# Patient Record
Sex: Female | Born: 1965 | Race: Black or African American | Hispanic: No | Marital: Married | Smoking: Never smoker
Health system: Southern US, Community
[De-identification: ages and names within clinical notes are randomized; demographics above are authoritative.]

## PROBLEM LIST (undated history)

## (undated) DIAGNOSIS — I82402 Acute embolism and thrombosis of unspecified deep veins of left lower extremity: Secondary | ICD-10-CM

## (undated) DIAGNOSIS — F32A Depression, unspecified: Secondary | ICD-10-CM

## (undated) DIAGNOSIS — G4700 Insomnia, unspecified: Secondary | ICD-10-CM

## (undated) DIAGNOSIS — F329 Major depressive disorder, single episode, unspecified: Secondary | ICD-10-CM

## (undated) DIAGNOSIS — K219 Gastro-esophageal reflux disease without esophagitis: Secondary | ICD-10-CM

## (undated) DIAGNOSIS — M549 Dorsalgia, unspecified: Secondary | ICD-10-CM

## (undated) DIAGNOSIS — M1990 Unspecified osteoarthritis, unspecified site: Secondary | ICD-10-CM

## (undated) DIAGNOSIS — E669 Obesity, unspecified: Secondary | ICD-10-CM

## (undated) DIAGNOSIS — R519 Headache, unspecified: Secondary | ICD-10-CM

## (undated) DIAGNOSIS — Z972 Presence of dental prosthetic device (complete) (partial): Secondary | ICD-10-CM

## (undated) DIAGNOSIS — D649 Anemia, unspecified: Secondary | ICD-10-CM

## (undated) DIAGNOSIS — R51 Headache: Secondary | ICD-10-CM

## (undated) DIAGNOSIS — IMO0002 Reserved for concepts with insufficient information to code with codable children: Secondary | ICD-10-CM

## (undated) DIAGNOSIS — Z9889 Other specified postprocedural states: Secondary | ICD-10-CM

## (undated) DIAGNOSIS — G8929 Other chronic pain: Secondary | ICD-10-CM

## (undated) DIAGNOSIS — I10 Essential (primary) hypertension: Secondary | ICD-10-CM

## (undated) DIAGNOSIS — R112 Nausea with vomiting, unspecified: Secondary | ICD-10-CM

## (undated) DIAGNOSIS — F419 Anxiety disorder, unspecified: Secondary | ICD-10-CM

## (undated) DIAGNOSIS — M4800 Spinal stenosis, site unspecified: Secondary | ICD-10-CM

## (undated) HISTORY — PX: LEEP: SHX91

## (undated) HISTORY — PX: CARPAL TUNNEL RELEASE: SHX101

## (undated) HISTORY — PX: MENISCUS REPAIR: SHX5179

## (undated) HISTORY — PX: BUNIONECTOMY: SHX129

## (undated) HISTORY — PX: ABDOMINAL HYSTERECTOMY: SHX81

## (undated) HISTORY — DX: Acute embolism and thrombosis of unspecified deep veins of left lower extremity: I82.402

## (undated) HISTORY — PX: TONSILLECTOMY: SUR1361

## (undated) HISTORY — PX: TUBAL LIGATION: SHX77

## (undated) HISTORY — DX: Obesity, unspecified: E66.9

## (undated) HISTORY — DX: Reserved for concepts with insufficient information to code with codable children: IMO0002

## (undated) HISTORY — PX: COLPOSCOPY: SHX161

## (undated) HISTORY — PX: LASER ABLATION CONDYLOMA CERVICAL / VULVAR: SUR819

## (undated) HISTORY — PX: BACK SURGERY: SHX140

---

## 1999-12-14 ENCOUNTER — Other Ambulatory Visit: Admission: RE | Admit: 1999-12-14 | Discharge: 1999-12-14 | Payer: Self-pay | Admitting: Obstetrics and Gynecology

## 1999-12-29 ENCOUNTER — Encounter (INDEPENDENT_AMBULATORY_CARE_PROVIDER_SITE_OTHER): Payer: Self-pay

## 1999-12-29 ENCOUNTER — Other Ambulatory Visit: Admission: RE | Admit: 1999-12-29 | Discharge: 1999-12-29 | Payer: Self-pay | Admitting: Obstetrics and Gynecology

## 2000-01-13 ENCOUNTER — Ambulatory Visit (HOSPITAL_COMMUNITY): Admission: RE | Admit: 2000-01-13 | Discharge: 2000-01-13 | Payer: Self-pay | Admitting: Obstetrics and Gynecology

## 2000-05-03 ENCOUNTER — Other Ambulatory Visit: Admission: RE | Admit: 2000-05-03 | Discharge: 2000-05-03 | Payer: Self-pay | Admitting: Obstetrics and Gynecology

## 2001-01-03 ENCOUNTER — Other Ambulatory Visit: Admission: RE | Admit: 2001-01-03 | Discharge: 2001-01-03 | Payer: Self-pay | Admitting: Obstetrics and Gynecology

## 2001-06-12 ENCOUNTER — Other Ambulatory Visit: Admission: RE | Admit: 2001-06-12 | Discharge: 2001-06-12 | Payer: Self-pay | Admitting: Obstetrics and Gynecology

## 2001-08-03 ENCOUNTER — Emergency Department (HOSPITAL_COMMUNITY): Admission: EM | Admit: 2001-08-03 | Discharge: 2001-08-03 | Payer: Self-pay

## 2002-01-08 ENCOUNTER — Other Ambulatory Visit: Admission: RE | Admit: 2002-01-08 | Discharge: 2002-01-08 | Payer: Self-pay | Admitting: Obstetrics and Gynecology

## 2002-06-30 ENCOUNTER — Other Ambulatory Visit: Admission: RE | Admit: 2002-06-30 | Discharge: 2002-06-30 | Payer: Self-pay | Admitting: Obstetrics and Gynecology

## 2003-02-03 ENCOUNTER — Other Ambulatory Visit: Admission: RE | Admit: 2003-02-03 | Discharge: 2003-02-03 | Payer: Self-pay | Admitting: Obstetrics and Gynecology

## 2004-02-09 ENCOUNTER — Other Ambulatory Visit: Admission: RE | Admit: 2004-02-09 | Discharge: 2004-02-09 | Payer: Self-pay | Admitting: Obstetrics and Gynecology

## 2004-06-03 ENCOUNTER — Ambulatory Visit (HOSPITAL_COMMUNITY): Admission: RE | Admit: 2004-06-03 | Discharge: 2004-06-03 | Payer: Self-pay | Admitting: Internal Medicine

## 2004-09-11 ENCOUNTER — Emergency Department (HOSPITAL_COMMUNITY): Admission: EM | Admit: 2004-09-11 | Discharge: 2004-09-11 | Payer: Self-pay | Admitting: Emergency Medicine

## 2004-09-29 ENCOUNTER — Encounter (INDEPENDENT_AMBULATORY_CARE_PROVIDER_SITE_OTHER): Payer: Self-pay | Admitting: Specialist

## 2004-09-29 ENCOUNTER — Ambulatory Visit (HOSPITAL_COMMUNITY): Admission: RE | Admit: 2004-09-29 | Discharge: 2004-09-29 | Payer: Self-pay | Admitting: Gastroenterology

## 2005-02-12 ENCOUNTER — Emergency Department (HOSPITAL_COMMUNITY): Admission: EM | Admit: 2005-02-12 | Discharge: 2005-02-12 | Payer: Self-pay | Admitting: Family Medicine

## 2005-02-16 ENCOUNTER — Other Ambulatory Visit: Admission: RE | Admit: 2005-02-16 | Discharge: 2005-02-16 | Payer: Self-pay | Admitting: Obstetrics and Gynecology

## 2005-05-07 ENCOUNTER — Emergency Department (HOSPITAL_COMMUNITY): Admission: EM | Admit: 2005-05-07 | Discharge: 2005-05-07 | Payer: Self-pay | Admitting: Family Medicine

## 2006-06-06 ENCOUNTER — Other Ambulatory Visit: Admission: RE | Admit: 2006-06-06 | Discharge: 2006-06-06 | Payer: Self-pay | Admitting: Obstetrics and Gynecology

## 2006-08-08 ENCOUNTER — Emergency Department (HOSPITAL_COMMUNITY): Admission: EM | Admit: 2006-08-08 | Discharge: 2006-08-08 | Payer: Self-pay | Admitting: Emergency Medicine

## 2007-01-19 ENCOUNTER — Emergency Department (HOSPITAL_COMMUNITY): Admission: EM | Admit: 2007-01-19 | Discharge: 2007-01-19 | Payer: Self-pay | Admitting: Emergency Medicine

## 2007-05-24 ENCOUNTER — Encounter: Admission: RE | Admit: 2007-05-24 | Discharge: 2007-05-24 | Payer: Self-pay | Admitting: Obstetrics and Gynecology

## 2008-07-08 ENCOUNTER — Encounter: Admission: RE | Admit: 2008-07-08 | Discharge: 2008-07-08 | Payer: Self-pay | Admitting: Obstetrics and Gynecology

## 2009-09-23 ENCOUNTER — Emergency Department (HOSPITAL_BASED_OUTPATIENT_CLINIC_OR_DEPARTMENT_OTHER): Admission: EM | Admit: 2009-09-23 | Discharge: 2009-09-23 | Payer: Self-pay | Admitting: Emergency Medicine

## 2009-09-23 ENCOUNTER — Ambulatory Visit: Payer: Self-pay | Admitting: Diagnostic Radiology

## 2009-09-30 ENCOUNTER — Encounter: Admission: RE | Admit: 2009-09-30 | Discharge: 2009-09-30 | Payer: Self-pay | Admitting: Internal Medicine

## 2009-10-20 ENCOUNTER — Encounter: Admission: RE | Admit: 2009-10-20 | Discharge: 2009-11-11 | Payer: Self-pay | Admitting: Orthopedic Surgery

## 2009-11-15 ENCOUNTER — Encounter: Admission: RE | Admit: 2009-11-15 | Discharge: 2010-02-13 | Payer: Self-pay | Admitting: Orthopedic Surgery

## 2009-12-07 ENCOUNTER — Encounter: Admission: RE | Admit: 2009-12-07 | Discharge: 2010-01-03 | Payer: Self-pay | Admitting: Orthopedic Surgery

## 2009-12-08 ENCOUNTER — Encounter: Admission: RE | Admit: 2009-12-08 | Discharge: 2009-12-08 | Payer: Self-pay | Admitting: Orthopedic Surgery

## 2010-03-25 ENCOUNTER — Ambulatory Visit: Payer: Self-pay | Admitting: Radiology

## 2010-03-25 ENCOUNTER — Emergency Department (HOSPITAL_BASED_OUTPATIENT_CLINIC_OR_DEPARTMENT_OTHER): Admission: EM | Admit: 2010-03-25 | Discharge: 2010-03-25 | Payer: Self-pay | Admitting: Emergency Medicine

## 2010-04-01 ENCOUNTER — Ambulatory Visit (HOSPITAL_COMMUNITY): Admission: RE | Admit: 2010-04-01 | Discharge: 2010-04-01 | Payer: Self-pay | Admitting: Obstetrics and Gynecology

## 2011-01-30 LAB — COMPREHENSIVE METABOLIC PANEL
ALT: 11 U/L (ref 0–35)
AST: 14 U/L (ref 0–37)
Albumin: 3.2 g/dL — ABNORMAL LOW (ref 3.5–5.2)
Alkaline Phosphatase: 52 U/L (ref 39–117)
BUN: 10 mg/dL (ref 6–23)
CO2: 28 mEq/L (ref 19–32)
Calcium: 9 mg/dL (ref 8.4–10.5)
Chloride: 104 mEq/L (ref 96–112)
Creatinine, Ser: 1.02 mg/dL (ref 0.4–1.2)
GFR calc Af Amer: >60 mL/min (ref >60)
GFR calc non Af Amer: 59 mL/min — ABNORMAL LOW (ref >60)
Glucose, Bld: 99 mg/dL (ref 70–99)
Potassium: 4.1 mEq/L (ref 3.5–5.1)
Sodium: 137 mEq/L (ref 135–145)
Total Bilirubin: 0.4 mg/dL (ref 0.3–1.2)
Total Protein: 6.7 g/dL (ref 6.0–8.3)

## 2011-01-30 LAB — CBC
HCT: 34.3 % — ABNORMAL LOW (ref 36.0–46.0)
Hemoglobin: 11.3 g/dL — ABNORMAL LOW (ref 12.0–15.0)
MCHC: 33 g/dL (ref 30.0–36.0)
MCV: 84.3 fL (ref 78.0–100.0)
Platelets: 230 10*3/uL (ref 150–400)
RBC: 4.07 MIL/uL (ref 3.87–5.11)
RDW: 13.4 % (ref 11.5–15.5)
WBC: 6.2 10*3/uL (ref 4.0–10.5)

## 2011-01-30 LAB — LIPID PANEL
Cholesterol: 132 mg/dL (ref 0–200)
HDL: 43 mg/dL (ref >39)
LDL Cholesterol: 59 mg/dL (ref 0–99)
Total CHOL/HDL Ratio: 3.1 RATIO
Triglycerides: 148 mg/dL (ref <150)
VLDL: 30 mg/dL (ref 0–40)

## 2011-01-30 LAB — HEMOGLOBIN A1C
Hgb A1c MFr Bld: 5.3 % (ref <5.7)
Mean Plasma Glucose: 105 mg/dL (ref <117)

## 2011-01-30 LAB — VITAMIN D 25 HYDROXY (VIT D DEFICIENCY, FRACTURES): Vit D, 25-Hydroxy: 20 ng/mL — ABNORMAL LOW (ref 30–89)

## 2011-01-31 LAB — URINALYSIS, ROUTINE W REFLEX MICROSCOPIC
Bilirubin Urine: NEGATIVE
Glucose, UA: NEGATIVE mg/dL
Ketones, ur: 15 mg/dL — AB
Leukocytes, UA: NEGATIVE
Nitrite: NEGATIVE
Protein, ur: NEGATIVE mg/dL
Specific Gravity, Urine: 1.03 (ref 1.005–1.030)
Urobilinogen, UA: 0.2 mg/dL (ref 0.0–1.0)
pH: 6 (ref 5.0–8.0)

## 2011-01-31 LAB — URINE MICROSCOPIC-ADD ON

## 2011-02-09 ENCOUNTER — Emergency Department (INDEPENDENT_AMBULATORY_CARE_PROVIDER_SITE_OTHER): Payer: Managed Care, Other (non HMO)

## 2011-02-09 ENCOUNTER — Emergency Department (HOSPITAL_BASED_OUTPATIENT_CLINIC_OR_DEPARTMENT_OTHER)
Admission: EM | Admit: 2011-02-09 | Discharge: 2011-02-09 | Disposition: A | Discharge status: Home / Self Care | Payer: Managed Care, Other (non HMO) | Attending: Emergency Medicine | Admitting: Emergency Medicine

## 2011-02-09 DIAGNOSIS — W010XXA Fall on same level from slipping, tripping and stumbling without subsequent striking against object, initial encounter: Secondary | ICD-10-CM

## 2011-02-09 DIAGNOSIS — M25529 Pain in unspecified elbow: Secondary | ICD-10-CM

## 2011-02-09 DIAGNOSIS — Y93E1 Activity, personal bathing and showering: Secondary | ICD-10-CM | POA: Insufficient documentation

## 2011-02-09 DIAGNOSIS — Z79899 Other long term (current) drug therapy: Secondary | ICD-10-CM | POA: Insufficient documentation

## 2011-02-09 DIAGNOSIS — Y92009 Unspecified place in unspecified non-institutional (private) residence as the place of occurrence of the external cause: Secondary | ICD-10-CM | POA: Insufficient documentation

## 2011-02-09 DIAGNOSIS — W19XXXA Unspecified fall, initial encounter: Secondary | ICD-10-CM | POA: Insufficient documentation

## 2011-02-09 DIAGNOSIS — S51009A Unspecified open wound of unspecified elbow, initial encounter: Secondary | ICD-10-CM | POA: Insufficient documentation

## 2011-02-09 DIAGNOSIS — I10 Essential (primary) hypertension: Secondary | ICD-10-CM | POA: Insufficient documentation

## 2011-03-31 NOTE — Op Note (Signed)
NAMEADAEZE, BETTER             ACCOUNT NO.:  [id number]   MEDICAL RECORD NO.:  [id number]          PATIENT TYPE:  AMB   LOCATION:  ENDO                         FACILITY:  [facility]   PHYSICIAN:  Jordan Hawks. Elnoria Howard, MD    DATE OF BIRTH:  04/26/1966   DATE OF PROCEDURE:  09/29/2004  DATE OF DISCHARGE:                                 OPERATIVE REPORT   PROCEDURE:  Colonoscopy.   INDICATIONS:  For diarrhea and abdominal pain.   CONSENT:  Informed consent was obtained from the patient describing the  risks of bleeding, infection, perforation, medication reactions, a 10% miss  rate for a small colon cancer or polyp, and the risk of death, all of which  are not exclusive of any other complications that may occur.   PHYSICAL EXAMINATION:  CARDIAC:  Regular rate and rhythm.  LUNGS:  Clear to auscultation bilaterally.  ABDOMEN:  Obese, soft, nontender, nondistended.   MEDICATIONS:  1.  Versed 4 mg IV.  2.  Demerol 60 mg IV.   PROCEDURE:  After completion of the EGD, the patient was positioned for the  colonoscopy.  A rectal examination was performed which was negative for any  palpable abnormalities.  The colonoscope was then inserted under direct  visualization to the terminal ileum with moderate difficulty.  The patient  was noted to have a tortuous colon.  Photodocumentation of the terminal  ileum and the cecum was performed, and the patient was noted to have a good  prep.  Upon withdrawal of the colonoscope, there was no evidence of any  masses, ulcerations, inflammation, polyps, erosions, vascular abnormalities  in the cecum, ascending, transverse, descending, or sigmoid colon.  The  patient is noted to have scattered diverticula throughout the colon, with  retroflexion of the colonoscope in the rectum.  There is evidence of  moderate-size external hemorrhoids.  Prior to withdrawal of the colonoscope,  multiple random  biopsies were taken throughout the colon. The colonoscope was  then  straightened and withdrawn from the patient.  The patient tolerated the  procedure well.  No complications were encountered.   PLAN AT THIS TIME:  Follow up with biopsies, and to return to clinic in two  weeks.       PDH/MEDQ  D:  09/29/2004  T:  09/29/2004  Job:  [id number]   cc:   Candyce Churn. Allyne Gee, M.D.  [address]  [address]  [place]  [place] [address]  Fax: [phone]

## 2011-03-31 NOTE — Op Note (Signed)
NAMEIJEOMA, Gwendolyn Russell             ACCOUNT NO.:  [id number]   MEDICAL RECORD NO.:  [id number]          PATIENT TYPE:  AMB   LOCATION:  ENDO                         FACILITY:  [facility]   PHYSICIAN:  Jordan Hawks. Elnoria Howard, MD    DATE OF BIRTH:  1966-06-16   DATE OF PROCEDURE:  09/29/2004  DATE OF DISCHARGE:                                 OPERATIVE REPORT   PROCEDURE:  Esophagogastroduodenoscopy.   INDICATIONS:  For abdominal pain and anemia.   CONSENT:  Informed consent was obtained from the patient describing the  risks of bleeding, infection, perforation, medication reactions, and the  risk of death, all of which are not exclusive of any other complications  that may occur.   PHYSICAL EXAMINATION:  CARDIAC:  Regular rate and rhythm.  LUNGS:  Clear to auscultation bilaterally.  ABDOMEN:  Soft, obese, nontender, nondistended.   MEDICATIONS:  1.  Versed 6 mg IV.  2.  Demerol 40 mg IV.  3.  Phenergan 12.5 mg IV.   PROCEDURE:  After adequate sedation was achieved, the endoscope was advanced  from the oral cavity to the proximal small bowel without any difficulty.  The photodocumentation of the duodenum was performed, and several random  small bowel biopsies were obtained.  Upon withdrawal of the endoscope into  the gastric lumen, the patient was noted to have a very small erosion in the  antrum.  Otherwise, there was no evidence of any ulcerations, masses,  vascular abnormalities, or evidence of overt inflammation.  Retroflexion was  negative for any hiatal hernia or masses at the cardia region.  The  endoscope was then straightened and withdrawn into the esophagus, and there  was  no evidence of any type of esophagitis or bleeding.  The endoscope was then  withdrawn from the patient, and the procedure was terminated.   PLAN AT THIS TIME:  Follow up on biopsies.       PDH/MEDQ  D:  09/29/2004  T:  09/29/2004  Job:  [id number]   cc:   Candyce Churn. Allyne Gee, M.D.  [address]  [address]  [place]  [place] [address]  Fax: [phone]

## 2011-07-31 ENCOUNTER — Other Ambulatory Visit: Payer: Self-pay | Admitting: Internal Medicine

## 2011-07-31 DIAGNOSIS — Z1231 Encounter for screening mammogram for malignant neoplasm of breast: Secondary | ICD-10-CM

## 2011-08-03 ENCOUNTER — Ambulatory Visit
Admission: RE | Admit: 2011-08-03 | Discharge: 2011-08-03 | Disposition: A | Discharge status: Home / Self Care | Payer: Managed Care, Other (non HMO) | Source: Ambulatory Visit | Attending: Internal Medicine | Admitting: Internal Medicine

## 2011-08-03 DIAGNOSIS — Z1231 Encounter for screening mammogram for malignant neoplasm of breast: Secondary | ICD-10-CM

## 2011-10-10 ENCOUNTER — Emergency Department (INDEPENDENT_AMBULATORY_CARE_PROVIDER_SITE_OTHER): Payer: Self-pay

## 2011-10-10 ENCOUNTER — Encounter: Payer: Self-pay | Admitting: Student

## 2011-10-10 ENCOUNTER — Emergency Department (HOSPITAL_BASED_OUTPATIENT_CLINIC_OR_DEPARTMENT_OTHER)
Admission: EM | Admit: 2011-10-10 | Discharge: 2011-10-10 | Disposition: A | Discharge status: Home / Self Care | Payer: Self-pay | Attending: Emergency Medicine | Admitting: Emergency Medicine

## 2011-10-10 DIAGNOSIS — J9819 Other pulmonary collapse: Secondary | ICD-10-CM

## 2011-10-10 DIAGNOSIS — R05 Cough: Secondary | ICD-10-CM

## 2011-10-10 DIAGNOSIS — J40 Bronchitis, not specified as acute or chronic: Secondary | ICD-10-CM | POA: Insufficient documentation

## 2011-10-10 DIAGNOSIS — I10 Essential (primary) hypertension: Secondary | ICD-10-CM | POA: Insufficient documentation

## 2011-10-10 DIAGNOSIS — K219 Gastro-esophageal reflux disease without esophagitis: Secondary | ICD-10-CM | POA: Insufficient documentation

## 2011-10-10 HISTORY — DX: Gastro-esophageal reflux disease without esophagitis: K21.9

## 2011-10-10 HISTORY — DX: Insomnia, unspecified: G47.00

## 2011-10-10 HISTORY — DX: Anxiety disorder, unspecified: F41.9

## 2011-10-10 HISTORY — DX: Essential (primary) hypertension: I10

## 2011-10-10 MED ORDER — AZITHROMYCIN 250 MG PO TABS: 250.0000 mg | ORAL_TABLET | Freq: Every day | ORAL | Status: AC

## 2011-10-10 MED ORDER — ALBUTEROL SULFATE HFA 108 (90 BASE) MCG/ACT IN AERS: 1.0000 | INHALATION_SPRAY | Freq: Four times a day (QID) | RESPIRATORY_TRACT | Status: DC | PRN

## 2011-10-10 NOTE — ED Provider Notes (Signed)
Medical screening examination/treatment/procedure(s) were performed by non-physician practitioner and as supervising physician I was immediately available for consultation/collaboration.   Gwyneth Sprout, MD 10/10/11 [phone]

## 2011-10-10 NOTE — ED Notes (Signed)
Onset Tuesday with ear itching and pain then progressed to scratchy throat and upper chest congestion.

## 2011-10-10 NOTE — ED Provider Notes (Signed)
History     CSN: [id number] Arrival date & time: 10/10/2011  2:04 PM   First MD Initiated Contact with Patient 10/10/11 1406      Chief Complaint  Patient presents with  . Cough    (Consider location/radiation/quality/duration/timing/severity/associated sxs/prior treatment) Patient is a 45 y.o. female presenting with cough. The history is provided by the patient. No language interpreter was used.  Cough This is a new problem. The current episode started more than 2 days ago. The problem occurs constantly. The problem has not changed since onset.The cough is productive of sputum. There has been no fever. Associated symptoms include rhinorrhea, sore throat and wheezing. Pertinent negatives include no chest pain. She has tried decongestants and cough syrup for the symptoms. She is not a smoker. Her past medical history is significant for bronchitis.    Past Medical History  Diagnosis Date  . Hypertension   . Anxiety   . Insomnia   . GERD (gastroesophageal reflux disease)     Past Surgical History  Procedure Date  . Tonsillectomy   . Carpal tunnel release   . Bunionectomy   . Laser ablation condyloma cervical / vulvar   . Tubal ligation     No family history on file.  History  Substance Use Topics  . Smoking status: Never Smoker   . Smokeless tobacco: Not on file  . Alcohol Use: No    OB History    Grav Para Term Preterm Abortions TAB SAB Ect Mult Living                  Review of Systems  HENT: Positive for sore throat and rhinorrhea.   Respiratory: Positive for cough and wheezing.   Cardiovascular: Negative for chest pain.  All other systems reviewed and are negative.    Allergies  Sulfa antibiotics and Tdap  Home Medications   Current Outpatient Rx  Name Route Sig Dispense Refill  . ALPRAZOLAM 0.5 MG PO TABS Oral Take 0.5 mg by mouth at bedtime as needed.      . TRIAMTERENE-HCTZ 50-25 MG PO CAPS Oral Take 0.5 capsules by mouth every morning.      Marland Kitchen  ZOLPIDEM TARTRATE 5 MG PO TABS Oral Take 5 mg by mouth at bedtime as needed.        BP 120/78  Pulse 87  Temp(Src) 98.4 F (36.9 C) (Oral)  Resp 20  Wt 244 lb (110.678 kg)  SpO2 97%  LMP 10/03/2011  Physical Exam  Nursing note and vitals reviewed. Constitutional: She is oriented to person, place, and time. She appears well-developed and well-nourished.  HENT:  Right Ear: External ear normal.  Left Ear: External ear normal.  Nose: Rhinorrhea present.  Mouth/Throat: Posterior oropharyngeal erythema present.  Neck: Normal range of motion. Neck supple.  Cardiovascular: Normal rate and regular rhythm.   Pulmonary/Chest: Effort normal and breath sounds normal.  Musculoskeletal: Normal range of motion.  Neurological: She is alert and oriented to person, place, and time.  Skin: Skin is warm and dry.  Psychiatric: She has a normal mood and affect.    ED Course  Procedures (including critical care time)  Labs Reviewed - No data to display Dg Chest 2 View  10/10/2011  *RADIOLOGY REPORT*  Clinical Data: Cough.  CHEST - 2 VIEW  Comparison: Chest x-ray 03/25/2010.  Findings: The cardiac silhouette, mediastinal and hilar contours are within normal limits and stable.  The lungs demonstrate streaky bibasilar subsegmental atelectasis but no infiltrates, edema or effusions.  The bony thorax is intact.  IMPRESSION: Streaky bibasilar subsegmental atelectasis but no infiltrates or effusions.  Original Report Authenticated By: P. Loralie Champagne, M.D.     1. Bronchitis       MDM  Pt not actively wheezing at this time:will treat for bronchitis        Teressa Lower, NP 10/10/11 1449

## 2012-03-26 ENCOUNTER — Other Ambulatory Visit: Payer: Self-pay | Admitting: Obstetrics and Gynecology

## 2012-03-26 ENCOUNTER — Ambulatory Visit (INDEPENDENT_AMBULATORY_CARE_PROVIDER_SITE_OTHER): Payer: Self-pay | Admitting: *Deleted

## 2012-03-26 ENCOUNTER — Encounter: Payer: Self-pay | Admitting: *Deleted

## 2012-03-26 VITALS — BP 125/83 | HR 87 | Temp 97.9°F | Ht 65.0 in | Wt 287.7 lb

## 2012-03-26 DIAGNOSIS — N6322 Unspecified lump in the left breast, upper inner quadrant: Secondary | ICD-10-CM

## 2012-03-26 DIAGNOSIS — Z1239 Encounter for other screening for malignant neoplasm of breast: Secondary | ICD-10-CM

## 2012-03-26 NOTE — Progress Notes (Signed)
Complaints of left breast lump and pain x 1 month.  Pap Smear:    Pap smear not performed today. Per patient last Pap smear was September 2012 at [facility] and normal. Per patient she had an abnormal Pap smear in 2000 that required a LEEP for follow up. Have requested above Pap smear result. No Pap smear results in EPIC.  Physical exam: Breasts Breasts symmetrical. No skin abnormalities bilateral breasts. No nipple retraction bilateral breasts. No nipple discharge bilateral breasts. No lymphadenopathy. No lumps palpated right breast. Palpated lump in the left breast at 10 o'clock 7 cm from the areola. Patient complained of tenderness when palpated lump. Patient states pain at lump occurs at times and when happens rates pain at a 10 out of 10. Patient referred to the [facility] for left breast diagnostic mammogram and left breast ultrasound. Appointment scheduled for Monday, Apr 01, 2012 at 1415.        Pelvic/Bimanual No Pap smear completed today since last Pap smear was September 2012. Pap smear not indicated per BCCCP guidelines.

## 2012-03-26 NOTE — Patient Instructions (Signed)
Taught patient how to perform BSE. Patient did not need a Pap smear today due to last Pap smear was September 2012 per patient. Let her know BCCCP will cover Pap smears every 3 years unless has a history of abnormal Pap smears. Patient referred to the [facility] for left breast diagnostic mammogram and left breast ultrasound. Appointment scheduled for Monday, Apr 01, 2012 at 1415.  Patient aware of appointment and will be there. Let patient know will follow up with her within the next couple weeks with results. Patient verbalized understanding.

## 2012-04-01 ENCOUNTER — Ambulatory Visit
Admission: RE | Admit: 2012-04-01 | Discharge: 2012-04-01 | Disposition: A | Discharge status: Home / Self Care | Payer: No Typology Code available for payment source | Source: Ambulatory Visit | Attending: Obstetrics and Gynecology | Admitting: Obstetrics and Gynecology

## 2012-04-01 DIAGNOSIS — N6322 Unspecified lump in the left breast, upper inner quadrant: Secondary | ICD-10-CM

## 2012-07-16 ENCOUNTER — Other Ambulatory Visit: Payer: Self-pay | Admitting: Obstetrics and Gynecology

## 2012-07-16 DIAGNOSIS — Z1231 Encounter for screening mammogram for malignant neoplasm of breast: Secondary | ICD-10-CM

## 2012-08-06 ENCOUNTER — Ambulatory Visit: Payer: Self-pay

## 2012-08-21 ENCOUNTER — Ambulatory Visit
Admission: RE | Admit: 2012-08-21 | Discharge: 2012-08-21 | Disposition: A | Discharge status: Home / Self Care | Payer: No Typology Code available for payment source | Source: Ambulatory Visit | Attending: Obstetrics and Gynecology | Admitting: Obstetrics and Gynecology

## 2012-08-21 DIAGNOSIS — Z1231 Encounter for screening mammogram for malignant neoplasm of breast: Secondary | ICD-10-CM

## 2012-09-05 ENCOUNTER — Ambulatory Visit: Payer: Self-pay

## 2012-09-18 IMAGING — CR DG ELBOW COMPLETE 3+V*L*
5 series · 5 of 5 positions shown · non-contrast
Comparison: None

CLINICAL DATA: Pain left elbow post fall

LEFT ELBOW - COMPLETE 3+ VIEW

[x elbow joint ap left]
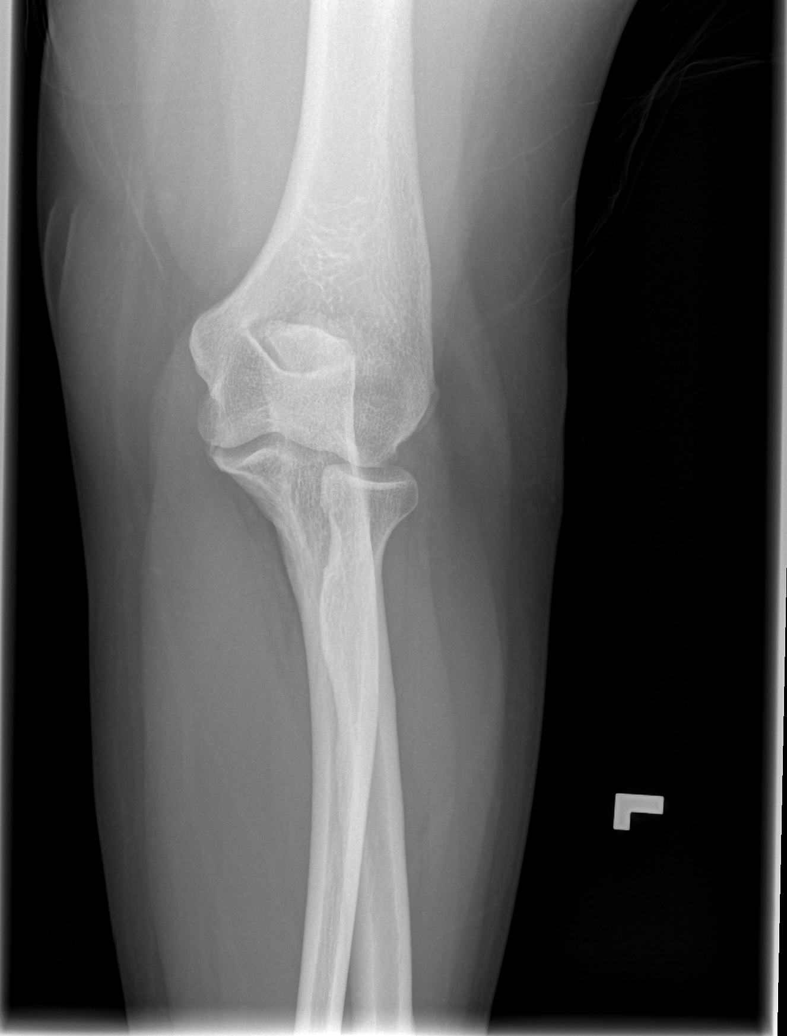

[x elbow joint obl. left (1 of 3)]
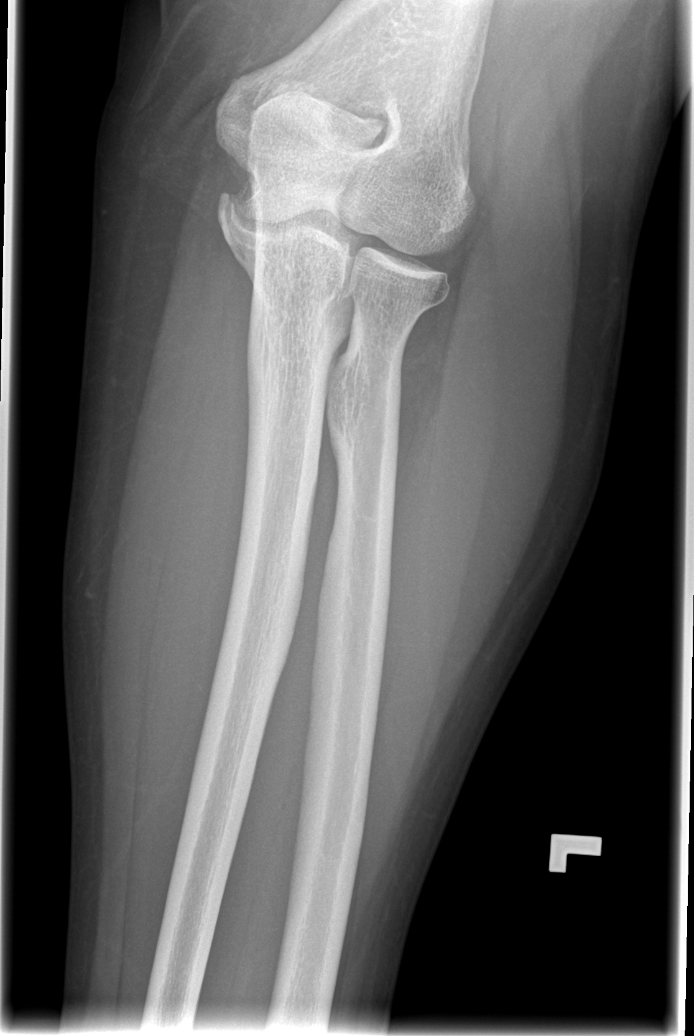

[x elbow joint obl. left (2 of 3)]
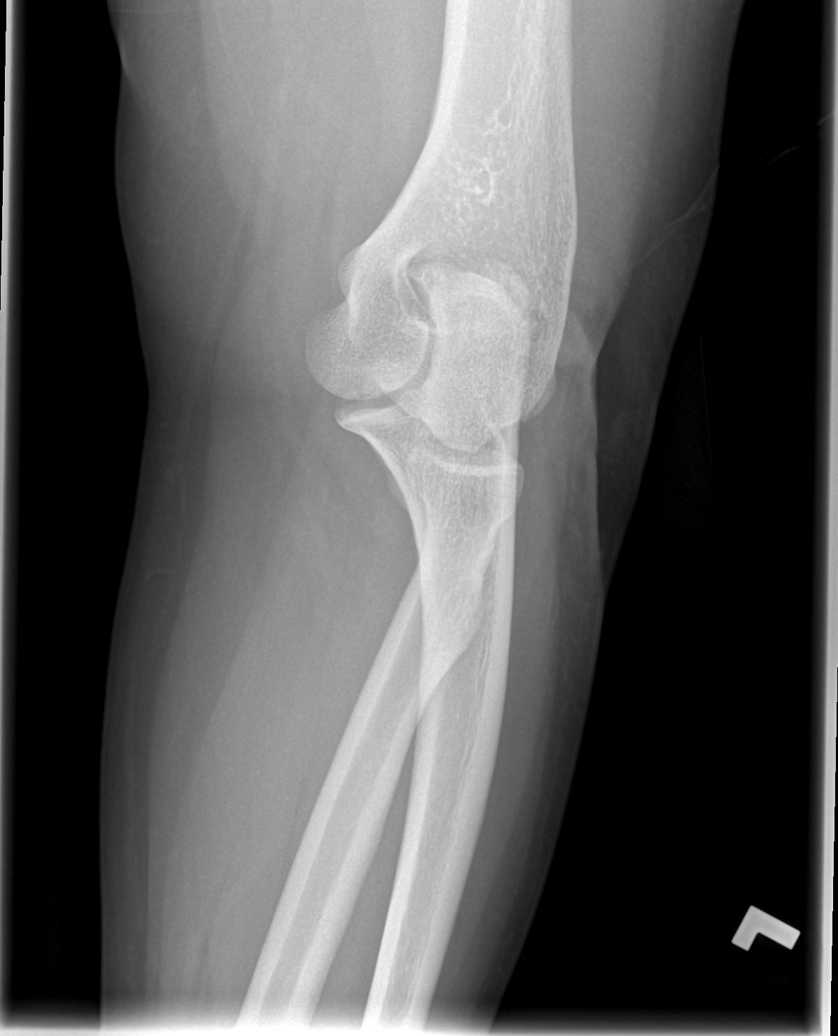

[x elbow joint obl. left (3 of 3)]
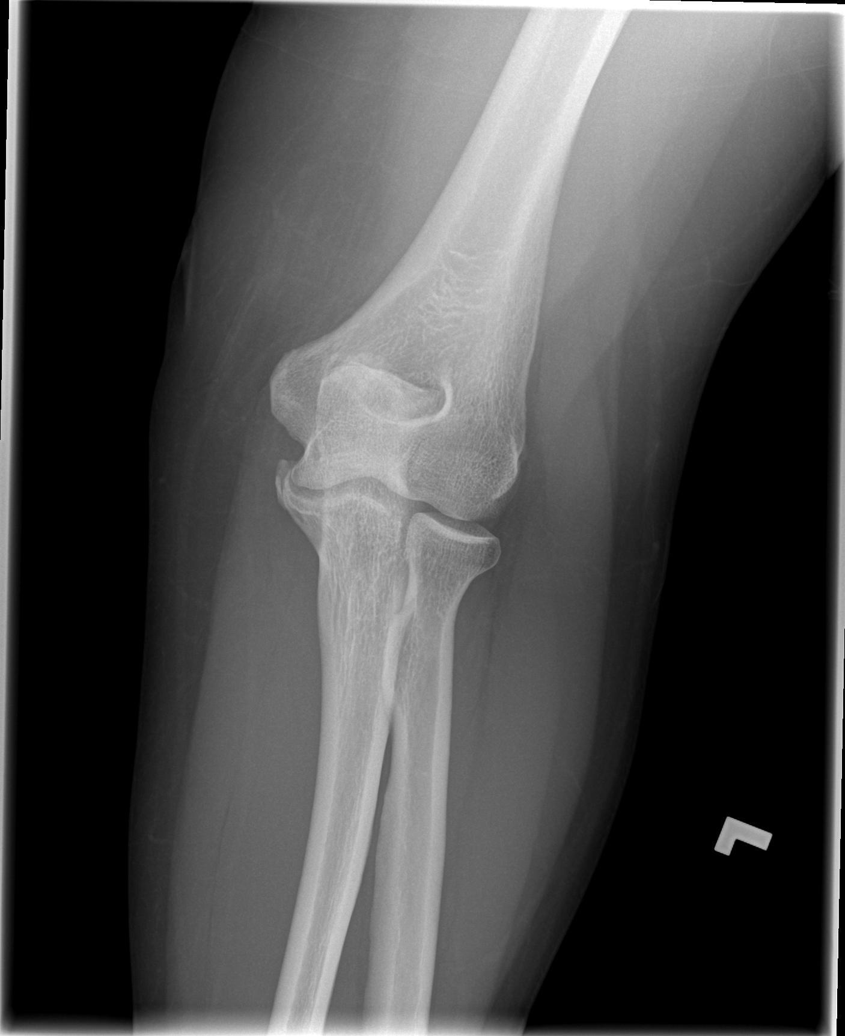

[x elbow joint lat left]
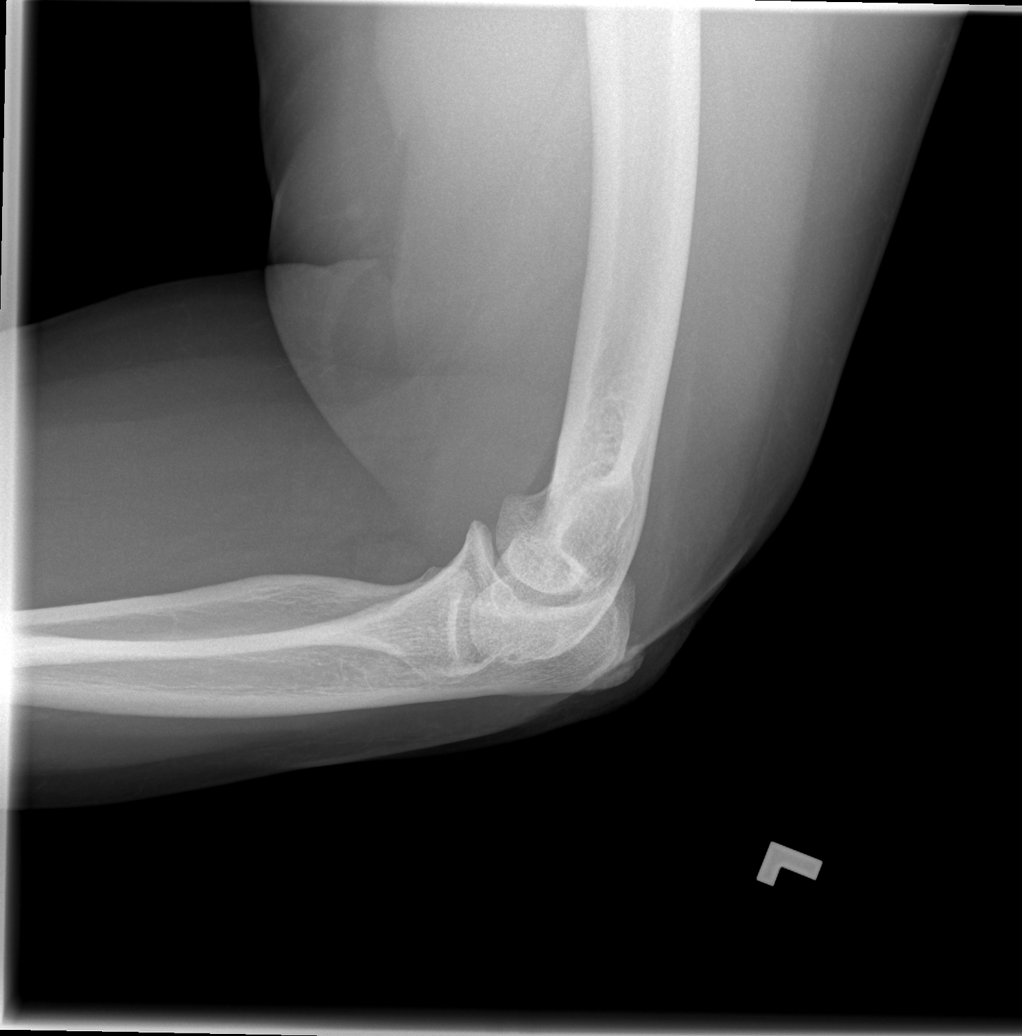

[5 of 5 positions shown; findings below may reference images not displayed]

FINDINGS: Joint spaces preserved.
Osseous mineralization normal.
Olecranon spur.
No acute fracture, dislocation or bone destruction.
No elbow joint effusion.
IMPRESSION: Olecranon spur.
No acute osseous abnormalities.

## 2012-10-17 ENCOUNTER — Encounter: Payer: Self-pay | Admitting: Obstetrics & Gynecology

## 2012-11-04 ENCOUNTER — Emergency Department (HOSPITAL_BASED_OUTPATIENT_CLINIC_OR_DEPARTMENT_OTHER): Payer: Self-pay

## 2012-11-04 ENCOUNTER — Encounter (HOSPITAL_BASED_OUTPATIENT_CLINIC_OR_DEPARTMENT_OTHER): Payer: Self-pay | Admitting: Emergency Medicine

## 2012-11-04 ENCOUNTER — Emergency Department (HOSPITAL_BASED_OUTPATIENT_CLINIC_OR_DEPARTMENT_OTHER)
Admission: EM | Admit: 2012-11-04 | Discharge: 2012-11-04 | Disposition: A | Discharge status: Home / Self Care | Payer: Self-pay | Attending: Emergency Medicine | Admitting: Emergency Medicine

## 2012-11-04 DIAGNOSIS — F411 Generalized anxiety disorder: Secondary | ICD-10-CM | POA: Insufficient documentation

## 2012-11-04 DIAGNOSIS — Z79899 Other long term (current) drug therapy: Secondary | ICD-10-CM | POA: Insufficient documentation

## 2012-11-04 DIAGNOSIS — Z9851 Tubal ligation status: Secondary | ICD-10-CM | POA: Insufficient documentation

## 2012-11-04 DIAGNOSIS — Z9889 Other specified postprocedural states: Secondary | ICD-10-CM | POA: Insufficient documentation

## 2012-11-04 DIAGNOSIS — Z86718 Personal history of other venous thrombosis and embolism: Secondary | ICD-10-CM | POA: Insufficient documentation

## 2012-11-04 DIAGNOSIS — M62838 Other muscle spasm: Secondary | ICD-10-CM | POA: Insufficient documentation

## 2012-11-04 DIAGNOSIS — I10 Essential (primary) hypertension: Secondary | ICD-10-CM | POA: Insufficient documentation

## 2012-11-04 DIAGNOSIS — Z8719 Personal history of other diseases of the digestive system: Secondary | ICD-10-CM | POA: Insufficient documentation

## 2012-11-04 MED ORDER — HYDROCODONE-ACETAMINOPHEN 5-500 MG PO TABS: 1.0000 | ORAL_TABLET | Freq: Four times a day (QID) | ORAL | Status: DC | PRN

## 2012-11-04 MED ORDER — NAPROXEN 375 MG PO TABS: 375.0000 mg | ORAL_TABLET | Freq: Two times a day (BID) | ORAL | Status: DC

## 2012-11-04 MED ORDER — METHOCARBAMOL 500 MG PO TABS
1000.0000 mg | ORAL_TABLET | Freq: Once | ORAL | Status: AC
  Administered 2012-11-04: 1000 mg via ORAL
  Filled 2012-11-04: qty 1

## 2012-11-04 MED ORDER — OXYCODONE-ACETAMINOPHEN 5-325 MG PO TABS: 1.0000 | ORAL_TABLET | ORAL | Status: DC | PRN

## 2012-11-04 MED ORDER — OXYCODONE HCL 5 MG/5ML PO SOLN: 5.0000 mg | ORAL | Status: DC | PRN

## 2012-11-04 MED ORDER — METHOCARBAMOL 500 MG PO TABS
ORAL_TABLET | ORAL | Status: AC
  Filled 2012-11-04: qty 1

## 2012-11-04 MED ORDER — METHOCARBAMOL 500 MG PO TABS: 500.0000 mg | ORAL_TABLET | Freq: Two times a day (BID) | ORAL | Status: DC

## 2012-11-04 MED ORDER — KETOROLAC TROMETHAMINE 60 MG/2ML IM SOLN
60.0000 mg | Freq: Once | INTRAMUSCULAR | Status: AC
  Administered 2012-11-04: 60 mg via INTRAMUSCULAR
  Filled 2012-11-04: qty 2

## 2012-11-04 NOTE — ED Notes (Signed)
Pt report she is having neck pain 10/10 for the last two weeks denies injury or event that lead to pain.

## 2012-11-04 NOTE — ED Provider Notes (Signed)
History     CSN: [id number]  Arrival date & time 11/04/12  0544   First MD Initiated Contact with Patient 11/04/12 [phone]      Chief Complaint  Patient presents with  . Muscle Pain    neck pain    (Consider location/radiation/quality/duration/timing/severity/associated sxs/prior treatment) Patient is a 46 y.o. female presenting with musculoskeletal pain. The history is provided by the patient. No language interpreter was used.  Muscle Pain This is a chronic problem. The current episode started more than 1 week ago (more than 2 weeks ago). The problem occurs constantly. The problem has not changed since onset.Pertinent negatives include no chest pain, no abdominal pain, no headaches and no shortness of breath. Exacerbated by: moving the neck to the left and palpation of the left neck. Nothing relieves the symptoms. She has tried nothing for the symptoms. The treatment provided no relief.  No weakness no numbness.  No f/c/r.  No rashes.    Past Medical History  Diagnosis Date  . Hypertension   . Anxiety   . Insomnia   . GERD (gastroesophageal reflux disease)   . Abnormal Pap smear   . DVT of leg (deep venous thrombosis), left     Past Surgical History  Procedure Date  . Tonsillectomy   . Carpal tunnel release   . Bunionectomy   . Laser ablation condyloma cervical / vulvar   . Tubal ligation   . Leep   . Colposcopy     Family History  Problem Relation Age of Onset  . Heart disease Maternal Grandmother   . Diabetes Father   . Heart disease Father   . Deep vein thrombosis Father   . Early death Mother   . Breast cancer Sister   . Diabetes Sister     History  Substance Use Topics  . Smoking status: Never Smoker   . Smokeless tobacco: Not on file  . Alcohol Use: No    OB History    Grav Para Term Preterm Abortions TAB SAB Ect Mult Living   2 2 2       2       Review of Systems  Constitutional: Negative for fever.  HENT: Positive for neck pain.     Respiratory: Negative for shortness of breath.   Cardiovascular: Negative for chest pain.  Gastrointestinal: Negative for abdominal pain.  Neurological: Negative for weakness, numbness and headaches.  All other systems reviewed and are negative.    Allergies  Sulfa antibiotics and Tdap  Home Medications   Current Outpatient Rx  Name  Route  Sig  Dispense  Refill  . ALBUTEROL SULFATE HFA 108 (90 BASE) MCG/ACT IN AERS   Inhalation   Inhale 1-2 puffs into the lungs every 6 (six) hours as needed for wheezing.   1 Inhaler   0   . ALPRAZOLAM 0.5 MG PO TABS   Oral   Take 0.5 mg by mouth at bedtime as needed.           Marland Kitchen OLMESARTAN-AMLODIPINE-HCTZ 40-10-12.5 MG PO TABS   Oral   Take by mouth.         . TRIAMTERENE-HCTZ 50-25 MG PO CAPS   Oral   Take 0.5 capsules by mouth every morning.           Marland Kitchen ZOLPIDEM TARTRATE 5 MG PO TABS   Oral   Take 5 mg by mouth at bedtime as needed.             BP  144/82  Pulse 103  Temp 98.4 F (36.9 C) (Oral)  Resp 23  SpO2 98%  Physical Exam  Constitutional: She is oriented to person, place, and time. She appears well-developed and well-nourished. No distress.  HENT:  Head: Normocephalic and atraumatic. Head is without raccoon's eyes, without Battle's sign, without right periorbital erythema and without left periorbital erythema.  Right Ear: No mastoid tenderness. Tympanic membrane is not injected. No hemotympanum.  Left Ear: No mastoid tenderness. Tympanic membrane is not injected. No hemotympanum.  Mouth/Throat: Oropharynx is clear and moist. No oropharyngeal exudate.  Eyes: Conjunctivae normal and EOM are normal. Pupils are equal, round, and reactive to light.  Neck: Normal range of motion. Neck supple. No JVD present. No tracheal deviation present.       No submandibular, no anterior no posterior LAN no supraclavicular LAN  Cardiovascular: Normal rate, regular rhythm and intact distal pulses.   Pulmonary/Chest: Effort  normal and breath sounds normal. No stridor. She has no wheezes. She has no rales.  Abdominal: Soft. Bowel sounds are normal. There is no tenderness.  Musculoskeletal: Normal range of motion. She exhibits no edema.       Arms: Lymphadenopathy:    She has no cervical adenopathy.  Neurological: She is alert and oriented to person, place, and time. She has normal reflexes.  Skin: Skin is warm and dry.  Psychiatric: She has a normal mood and affect.    ED Course  Procedures (including critical care time)  Labs Reviewed - No data to display No results found.   No diagnosis found.    MDM  Follow up with your doctor for ongoing care return for fevers swelling weakness numbness or any concerns        Hanaa Payes K Archibald Marchetta-Rasch, MD 11/04/12 [phone]

## 2012-11-05 ENCOUNTER — Emergency Department (HOSPITAL_BASED_OUTPATIENT_CLINIC_OR_DEPARTMENT_OTHER)
Admission: EM | Admit: 2012-11-05 | Discharge: 2012-11-05 | Disposition: A | Discharge status: Home / Self Care | Payer: Self-pay | Attending: Emergency Medicine | Admitting: Emergency Medicine

## 2012-11-05 ENCOUNTER — Encounter (HOSPITAL_BASED_OUTPATIENT_CLINIC_OR_DEPARTMENT_OTHER): Payer: Self-pay | Admitting: Family Medicine

## 2012-11-05 DIAGNOSIS — Z8742 Personal history of other diseases of the female genital tract: Secondary | ICD-10-CM | POA: Insufficient documentation

## 2012-11-05 DIAGNOSIS — K0889 Other specified disorders of teeth and supporting structures: Secondary | ICD-10-CM

## 2012-11-05 DIAGNOSIS — Z8679 Personal history of other diseases of the circulatory system: Secondary | ICD-10-CM | POA: Insufficient documentation

## 2012-11-05 DIAGNOSIS — Z79899 Other long term (current) drug therapy: Secondary | ICD-10-CM | POA: Insufficient documentation

## 2012-11-05 DIAGNOSIS — Z8719 Personal history of other diseases of the digestive system: Secondary | ICD-10-CM | POA: Insufficient documentation

## 2012-11-05 DIAGNOSIS — F411 Generalized anxiety disorder: Secondary | ICD-10-CM | POA: Insufficient documentation

## 2012-11-05 DIAGNOSIS — I10 Essential (primary) hypertension: Secondary | ICD-10-CM | POA: Insufficient documentation

## 2012-11-05 DIAGNOSIS — G4700 Insomnia, unspecified: Secondary | ICD-10-CM | POA: Insufficient documentation

## 2012-11-05 DIAGNOSIS — K089 Disorder of teeth and supporting structures, unspecified: Secondary | ICD-10-CM | POA: Insufficient documentation

## 2012-11-05 DIAGNOSIS — R220 Localized swelling, mass and lump, head: Secondary | ICD-10-CM | POA: Insufficient documentation

## 2012-11-05 MED ORDER — HYDROCODONE-ACETAMINOPHEN 5-325 MG PO TABS: 2.0000 | ORAL_TABLET | ORAL | Status: AC | PRN

## 2012-11-05 MED ORDER — KETOROLAC TROMETHAMINE 60 MG/2ML IM SOLN
INTRAMUSCULAR | Status: AC
  Filled 2012-11-05: qty 2

## 2012-11-05 MED ORDER — PENICILLIN V POTASSIUM 500 MG PO TABS: 500.0000 mg | ORAL_TABLET | Freq: Four times a day (QID) | ORAL | Status: AC

## 2012-11-05 MED ORDER — KETOROLAC TROMETHAMINE 60 MG/2ML IM SOLN
60.0000 mg | Freq: Once | INTRAMUSCULAR | Status: AC
  Administered 2012-11-05: 13:00:00 via INTRAMUSCULAR

## 2012-11-05 NOTE — ED Provider Notes (Signed)
Medical screening examination/treatment/procedure(s) were performed by non-physician practitioner and as supervising physician I was immediately available for consultation/collaboration.  Jones Skene, M.D.     Jones Skene, MD 11/05/12 1604

## 2012-11-05 NOTE — ED Notes (Signed)
Pt c/o left upper dental pain since Thanksgiving and worse past 2 days. Pt also c/o left lateral neck "spasm". Pt was seen and treated here 2 days ago for same. Pt sts she is running out of meds for same.

## 2012-11-05 NOTE — ED Provider Notes (Signed)
History     CSN: [id number]  Arrival date & time 11/05/12  1016   First MD Initiated Contact with Patient 11/05/12 1208      Chief Complaint  Patient presents with  . Dental Pain    (Consider location/radiation/quality/duration/timing/severity/associated sxs/prior treatment) Patient is a 46 y.o. female presenting with tooth pain. The history is provided by the patient. No language interpreter was used.  Dental PainThe primary symptoms include mouth pain and dental injury. The symptoms began more than 1 month ago. The symptoms are worsening. The symptoms are chronic. The symptoms occur constantly.  Additional symptoms include: gum swelling and facial swelling. Medical issues do not include: alcohol problem.  Pt complains of a toothache and neck pain  Past Medical History  Diagnosis Date  . Hypertension   . Anxiety   . Insomnia   . GERD (gastroesophageal reflux disease)   . Abnormal Pap smear   . DVT of leg (deep venous thrombosis), left     Past Surgical History  Procedure Date  . Tonsillectomy   . Carpal tunnel release   . Bunionectomy   . Laser ablation condyloma cervical / vulvar   . Tubal ligation   . Leep   . Colposcopy     Family History  Problem Relation Age of Onset  . Heart disease Maternal Grandmother   . Diabetes Father   . Heart disease Father   . Deep vein thrombosis Father   . Early death Mother   . Breast cancer Sister   . Diabetes Sister     History  Substance Use Topics  . Smoking status: Never Smoker   . Smokeless tobacco: Not on file  . Alcohol Use: No    OB History    Grav Para Term Preterm Abortions TAB SAB Ect Mult Living   2 2 2       2       Review of Systems  HENT: Positive for facial swelling and dental problem.   All other systems reviewed and are negative.    Allergies  Sulfa antibiotics and Tdap  Home Medications   Current Outpatient Rx  Name  Route  Sig  Dispense  Refill  . ALBUTEROL SULFATE HFA 108 (90 BASE)  MCG/ACT IN AERS   Inhalation   Inhale 1-2 puffs into the lungs every 6 (six) hours as needed for wheezing.   1 Inhaler   0   . ALPRAZOLAM 0.5 MG PO TABS   Oral   Take 0.5 mg by mouth at bedtime as needed.           . MELOXICAM 7.5 MG PO TABS   Oral   Take 7.5 mg by mouth daily.         Marland Kitchen METHOCARBAMOL 500 MG PO TABS   Oral   Take 1 tablet (500 mg total) by mouth 2 (two) times daily.   20 tablet   0   . NAPROXEN 375 MG PO TABS   Oral   Take 1 tablet (375 mg total) by mouth 2 (two) times daily.   20 tablet   0   . OLMESARTAN-AMLODIPINE-HCTZ 40-10-12.5 MG PO TABS   Oral   Take by mouth.         . OXYCODONE HCL 5 MG/5ML PO SOLN   Oral   Take 5 mLs (5 mg total) by mouth every 4 (four) hours as needed for pain.   30 mL   0   . OXYCODONE-ACETAMINOPHEN 5-325 MG PO TABS  Oral   Take 1 tablet by mouth every 4 (four) hours as needed for pain.   6 tablet   0   . TRIAMTERENE-HCTZ 50-25 MG PO CAPS   Oral   Take 0.5 capsules by mouth every morning.           Marland Kitchen ZOLPIDEM TARTRATE 5 MG PO TABS   Oral   Take 5 mg by mouth at bedtime as needed.             BP 114/69  Pulse 86  Temp 97.8 F (36.6 C) (Oral)  Resp 16  SpO2 99%  Physical Exam  Nursing note and vitals reviewed. Constitutional: She is oriented to person, place, and time. She appears well-developed and well-nourished.  HENT:  Head: Normocephalic and atraumatic.  Right Ear: External ear normal.  Left Ear: External ear normal.       Gum swelling,  Tender left neck  Eyes: Conjunctivae normal and EOM are normal. Pupils are equal, round, and reactive to light.  Neck: Normal range of motion. Neck supple.  Cardiovascular: Normal rate and normal heart sounds.   Pulmonary/Chest: Effort normal and breath sounds normal.  Abdominal: Soft.  Musculoskeletal: Normal range of motion.  Neurological: She is alert and oriented to person, place, and time. She has normal reflexes.  Skin: Skin is warm.   Psychiatric: She has a normal mood and affect.    ED Course  Procedures (including critical care time)  Labs Reviewed - No data to display Dg Cervical Spine Complete  11/04/2012  *RADIOLOGY REPORT*  Clinical Data: Neck pain for two weeks.  CERVICAL SPINE - COMPLETE 4+ VIEW  Comparison: None.  Findings: There is no evidence of fracture or subluxation. Vertebral bodies demonstrate normal height and alignment. Intervertebral disc spaces are preserved.  Scattered small anterior and posterior disc osteophyte complexes are noted along the lower cervical spine.  Prevertebral soft tissues are within normal limits.  The provided odontoid view demonstrates no significant abnormality.  The visualized lung apices are clear.  IMPRESSION:  1.  No evidence of fracture or subluxation along the cervical spine. 2.  Mild degenerative change noted along the lower cervical spine.   Original Report Authenticated By: Tonia Ghent, M.D.      No diagnosis found.    MDM  Pt seen for the same yesterday  Has taken all percocet.   I will treat with pcn and hydrocodone       Lonia Skinner Donalds, [place] 11/05/12 1222

## 2012-11-05 NOTE — ED Notes (Signed)
Family at bedside. 

## 2012-11-15 ENCOUNTER — Encounter: Payer: Self-pay | Admitting: Obstetrics & Gynecology

## 2012-11-15 ENCOUNTER — Ambulatory Visit (INDEPENDENT_AMBULATORY_CARE_PROVIDER_SITE_OTHER): Payer: Self-pay | Admitting: Obstetrics & Gynecology

## 2012-11-15 VITALS — BP 121/70 | HR 96 | Temp 97.5°F | Ht 65.0 in | Wt 293.4 lb

## 2012-11-15 DIAGNOSIS — N920 Excessive and frequent menstruation with regular cycle: Secondary | ICD-10-CM | POA: Insufficient documentation

## 2012-11-15 DIAGNOSIS — Z01812 Encounter for preprocedural laboratory examination: Secondary | ICD-10-CM

## 2012-11-15 DIAGNOSIS — E669 Obesity, unspecified: Secondary | ICD-10-CM

## 2012-11-15 LAB — CBC
HCT: 34.6 % — ABNORMAL LOW (ref 36.0–46.0)
Hemoglobin: 11.2 g/dL — ABNORMAL LOW (ref 12.0–15.0)
MCH: 26.5 pg (ref 26.0–34.0)
MCV: 81.8 fL (ref 78.0–100.0)
Platelets: 301 10*3/uL (ref 150–400)
RBC: 4.23 MIL/uL (ref 3.87–5.11)

## 2012-11-15 LAB — POCT PREGNANCY, URINE: Preg Test, Ur: NEGATIVE

## 2012-11-15 MED ORDER — FLUCONAZOLE 150 MG PO TABS: 150.0000 mg | ORAL_TABLET | Freq: Once | ORAL | Status: DC

## 2012-11-15 MED ORDER — NORELGESTROMIN-ETH ESTRADIOL 150-35 MCG/24HR TD PTWK: 1.0000 | MEDICATED_PATCH | TRANSDERMAL | Status: DC

## 2012-11-15 NOTE — Patient Instructions (Signed)

## 2012-11-15 NOTE — Progress Notes (Signed)
Patient ID: Gwendolyn Russell, female   DOB: [birth year], 47 y.o.   MRN: [id number]  Chief Complaint  Patient presents with  . Referral    irregular bleeding; sometimes two period per month    HPI Gwendolyn Russell is a 47 y.o. female.  [id number] Patient's last menstrual period was 11/15/2012. Frequent menses, previously on Ortho Evra for cycle control. She was worked up age 73 for possible DVT and this was not diagnosed, never treated. HPI  Past Medical History  Diagnosis Date  . Hypertension   . Anxiety   . Insomnia   . GERD (gastroesophageal reflux disease)   . Abnormal Pap smear   . DVT of leg (deep venous thrombosis), left     Past Surgical History  Procedure Date  . Tonsillectomy   . Carpal tunnel release   . Bunionectomy   . Laser ablation condyloma cervical / vulvar   . Tubal ligation   . Leep   . Colposcopy     Family History  Problem Relation Age of Onset  . Heart disease Maternal Grandmother   . Diabetes Father   . Heart disease Father   . Deep vein thrombosis Father   . Early death Mother   . Breast cancer Sister   . Diabetes Sister     Social History History  Substance Use Topics  . Smoking status: Never Smoker   . Smokeless tobacco: Not on file  . Alcohol Use: No    Allergies  Allergen Reactions  . Sulfa Antibiotics   . Tdap (Diphth-Acell Pertussis-Tetanus)     Current Outpatient Prescriptions  Medication Sig Dispense Refill  . ALPRAZolam (XANAX) 0.5 MG tablet Take 0.5 mg by mouth at bedtime as needed.        . diazepam (VALIUM) 5 MG tablet Take 5 mg by mouth 3 (three) times daily as needed.      Marland Kitchen HYDROcodone-acetaminophen (NORCO/VICODIN) 5-325 MG per tablet Take 2 tablets by mouth every 4 (four) hours as needed for pain.  16 tablet  0  . ibuprofen (ADVIL,MOTRIN) 200 MG tablet Take 200 mg by mouth as needed.      . meloxicam (MOBIC) 7.5 MG tablet Take 7.5 mg by mouth daily.      . methocarbamol (ROBAXIN) 500 MG tablet Take 1 tablet (500 mg  total) by mouth 2 (two) times daily.  20 tablet  0  . Olmesartan-Amlodipine-HCTZ (TRIBENZOR) 40-10-12.5 MG TABS Take by mouth.      . oxyCODONE (ROXICODONE) 5 MG/5ML solution Take 5 mLs (5 mg total) by mouth every 4 (four) hours as needed for pain.  30 mL  0  . penicillin v potassium (VEETID) 500 MG tablet Take 500 mg by mouth 4 (four) times daily.      . predniSONE (DELTASONE) 20 MG tablet Take 20 mg by mouth daily. Take three tablets bu mouth for 5 days      . zolpidem (AMBIEN) 5 MG tablet Take 5 mg by mouth at bedtime as needed.        Marland Kitchen albuterol (PROVENTIL HFA;VENTOLIN HFA) 108 (90 BASE) MCG/ACT inhaler Inhale 1-2 puffs into the lungs every 6 (six) hours as needed for wheezing.  1 Inhaler  0  . fluconazole (DIFLUCAN) 150 MG tablet Take 1 tablet (150 mg total) by mouth once.  1 tablet  0  . naproxen (NAPROSYN) 375 MG tablet Take 1 tablet (375 mg total) by mouth 2 (two) times daily.  20 tablet  0  . norelgestromin-ethinyl estradiol (  ORTHO EVRA) 150-20 MCG/24HR transdermal patch Place 1 patch onto the skin once a week.  3 patch  12  . oxyCODONE-acetaminophen (PERCOCET/ROXICET) 5-325 MG per tablet Take 1 tablet by mouth every 4 (four) hours as needed for pain.  6 tablet  0  . triamterene-hydrochlorothiazide (DYAZIDE) 50-25 MG per capsule Take 0.5 capsules by mouth every morning.          Review of Systems Review of Systems  Constitutional: Positive for unexpected weight change (gain ).  Genitourinary: Positive for vaginal bleeding and menstrual problem.    Blood pressure 121/70, pulse 96, temperature 97.5 F (36.4 C), temperature source Oral, height 5\' 5"  (1.651 m), weight 293 lb 6.4 oz (133.085 kg), last menstrual period 11/15/2012.  Physical Exam Physical Exam  Constitutional: No distress.       Morbid obesity  Pulmonary/Chest: Effort normal. No respiratory distress.  Abdominal: There is no tenderness.  Genitourinary: Vagina normal and uterus normal. No vaginal discharge found.        No mass  Skin: Skin is warm and dry.  Psychiatric: She has a normal mood and affect. Her behavior is normal.    Data Reviewed Previous visits  Assessment    Perimenopausal menorrhagia, irregular menses TSH, FSH, CBC, [place], Ortho Evra RTC for f/u 4 weeks    Plan    RTC 4 weeks        Gwendolyn Russell 11/15/2012, 1:06 PM

## 2012-11-18 ENCOUNTER — Telehealth: Payer: Self-pay | Admitting: *Deleted

## 2012-11-18 NOTE — Telephone Encounter (Signed)
Gwendolyn Russell called 11/15/12 and left a message stating she saw Dr. Debroah Loop 11/15/12 and he prescribed Ortho evra patch from [facility] and it is not going to be affordable- states it is over $100 without insurance. States they discussed this possibility and requests Dr. Debroah Loop to call her in a birth control pill prescription

## 2012-11-19 ENCOUNTER — Ambulatory Visit (HOSPITAL_COMMUNITY)
Admission: RE | Admit: 2012-11-19 | Discharge: 2012-11-19 | Disposition: A | Discharge status: Home / Self Care | Payer: Self-pay | Source: Ambulatory Visit | Attending: Obstetrics & Gynecology | Admitting: Obstetrics & Gynecology

## 2012-11-19 DIAGNOSIS — N920 Excessive and frequent menstruation with regular cycle: Secondary | ICD-10-CM

## 2012-11-19 MED ORDER — NORGESTIM-ETH ESTRAD TRIPHASIC 0.18/0.215/0.25 MG-35 MCG PO TABS: 1.0000 | ORAL_TABLET | Freq: Every day | ORAL | Status: DC

## 2012-11-19 NOTE — Telephone Encounter (Signed)
Call placed to pt after consult w/Dr. Debroah Loop and left message that I was returning her call and have information for her from the doctor. Please leave a new message stating when you can be reached or if a detailed message can be left on your voice mail.  **Pt can be informed that a Rx for OCP's has been sent to her [facility] pharmacy.

## 2012-11-20 NOTE — Telephone Encounter (Signed)
Pt returned my call and I informed her that a prescription for birth control pills has been sent to her pharmacy.  Pt voiced understanding.

## 2012-12-11 ENCOUNTER — Ambulatory Visit (INDEPENDENT_AMBULATORY_CARE_PROVIDER_SITE_OTHER): Payer: Self-pay | Admitting: Obstetrics & Gynecology

## 2012-12-11 ENCOUNTER — Ambulatory Visit: Payer: Self-pay | Admitting: Obstetrics & Gynecology

## 2012-12-11 ENCOUNTER — Encounter: Payer: Self-pay | Admitting: Obstetrics & Gynecology

## 2012-12-11 VITALS — BP 121/79 | HR 77 | Temp 97.0°F | Wt 291.0 lb

## 2012-12-11 DIAGNOSIS — N938 Other specified abnormal uterine and vaginal bleeding: Secondary | ICD-10-CM

## 2012-12-11 DIAGNOSIS — N949 Unspecified condition associated with female genital organs and menstrual cycle: Secondary | ICD-10-CM

## 2012-12-11 NOTE — Progress Notes (Signed)
  Subjective:    Patient ID: Gwendolyn Russell, female    DOB: [birth year], 47 y.o.   MRN: [id number]  HPI 47 yo M P2 who is here for follow up after her u/s recently. She was seen by Dr. Debroah Loop about 2 weeks ago here in the clinic for evaluation of irregular periods. Sometimes she skips periods and other times she will have 2 periods per month. She was planning to start Ortho Evra with her NMP (which is now). Her u/s was normal and her Hbg was 11.2. Her FSH and TSH were also normal.   Review of Systems She has had a BTL.   She had "probable phlebitis" in 1984 and she was never treated with anticoagulants. 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 Objective:   Physical Exam        Assessment & Plan:  DUB- I agree with Dr. Olivia Mackie plan to start Ortho Evra. If this does not work, then she could consider a Mirena IUD.

## 2013-01-16 ENCOUNTER — Ambulatory Visit: Payer: Self-pay | Admitting: Obstetrics & Gynecology

## 2013-07-23 ENCOUNTER — Other Ambulatory Visit: Payer: Self-pay

## 2013-08-01 ENCOUNTER — Other Ambulatory Visit: Payer: Self-pay

## 2013-08-01 DIAGNOSIS — Z1231 Encounter for screening mammogram for malignant neoplasm of breast: Secondary | ICD-10-CM

## 2013-08-22 ENCOUNTER — Ambulatory Visit: Payer: Self-pay

## 2013-08-27 ENCOUNTER — Ambulatory Visit
Admission: RE | Admit: 2013-08-27 | Discharge: 2013-08-27 | Disposition: A | Discharge status: Home / Self Care | Payer: BC Managed Care – PPO | Source: Ambulatory Visit

## 2013-08-27 DIAGNOSIS — Z1231 Encounter for screening mammogram for malignant neoplasm of breast: Secondary | ICD-10-CM

## 2013-11-27 ENCOUNTER — Telehealth: Payer: Self-pay | Admitting: General Practice

## 2013-11-27 DIAGNOSIS — N938 Other specified abnormal uterine and vaginal bleeding: Secondary | ICD-10-CM

## 2013-11-27 MED ORDER — NORELGESTROMIN-ETH ESTRADIOL 150-35 MCG/24HR TD PTWK: 1.0000 | MEDICATED_PATCH | TRANSDERMAL | Status: DC

## 2013-11-27 NOTE — Telephone Encounter (Signed)
Patient called and left message stating she would like to know if we take [employer] insurance because she just got that. Also, she needs her ortho evra patches refilled and knows it's been a while since she has been seen so she would like an appt if needed but she only has 2 weeks left of the patches. Spoke to Dr Elly Modena about refilling, who stated we could refill the patches until the patient can come in for an appt (march). Called patient and stated that I am returning her phone call and that we do accept her insurance and that we do not have any available appointments till march but we can refill the patches till then. Patient verbalized understanding. Told patient I would let our front office staff know she needs an appt and they will call her. Patient verbalized understanding and had no further questions

## 2013-12-17 ENCOUNTER — Encounter: Payer: Self-pay | Admitting: Obstetrics & Gynecology

## 2013-12-17 ENCOUNTER — Ambulatory Visit (INDEPENDENT_AMBULATORY_CARE_PROVIDER_SITE_OTHER): Payer: Self-pay | Admitting: Obstetrics & Gynecology

## 2013-12-17 ENCOUNTER — Telehealth: Payer: Self-pay

## 2013-12-17 VITALS — BP 129/82 | HR 85 | Temp 97.1°F | Ht 66.0 in | Wt 289.6 lb

## 2013-12-17 DIAGNOSIS — Z3045 Encounter for surveillance of transdermal patch hormonal contraceptive device: Secondary | ICD-10-CM | POA: Insufficient documentation

## 2013-12-17 DIAGNOSIS — N949 Unspecified condition associated with female genital organs and menstrual cycle: Secondary | ICD-10-CM

## 2013-12-17 DIAGNOSIS — N938 Other specified abnormal uterine and vaginal bleeding: Secondary | ICD-10-CM

## 2013-12-17 DIAGNOSIS — N925 Other specified irregular menstruation: Secondary | ICD-10-CM

## 2013-12-17 DIAGNOSIS — Z01419 Encounter for gynecological examination (general) (routine) without abnormal findings: Secondary | ICD-10-CM

## 2013-12-17 DIAGNOSIS — Z3049 Encounter for surveillance of other contraceptives: Secondary | ICD-10-CM

## 2013-12-17 LAB — POCT URINALYSIS DIP (DEVICE)
Bilirubin Urine: NEGATIVE
Glucose, UA: NEGATIVE mg/dL
Ketones, ur: NEGATIVE mg/dL
Leukocytes, UA: NEGATIVE
Nitrite: NEGATIVE
Protein, ur: NEGATIVE mg/dL
Specific Gravity, Urine: 1.025 (ref 1.005–1.030)
Urobilinogen, UA: 0.2 mg/dL (ref 0.0–1.0)
pH: 5 (ref 5.0–8.0)

## 2013-12-17 MED ORDER — NORELGESTROMIN-ETH ESTRADIOL 150-35 MCG/24HR TD PTWK: 1.0000 | MEDICATED_PATCH | TRANSDERMAL | Status: DC

## 2013-12-17 NOTE — Progress Notes (Signed)
Patient ID: Gwendolyn Russell, female   DOB: 04/21/1966, 48 y.o.   MRN: [id number]  Chief Complaint  Patient presents with  . Gynecologic Exam    HPI Gwendolyn Russell is a 48 y.o. female.  Good cycle control with patch needs refill. Notes s/p pain with intercourse and urinary frequency.  HPI  Past Medical History  Diagnosis Date  . Hypertension   . Anxiety   . Insomnia   . GERD (gastroesophageal reflux disease)   . Abnormal Pap smear   . DVT of leg (deep venous thrombosis), left   . Obesity     Past Surgical History  Procedure Laterality Date  . Tonsillectomy    . Carpal tunnel release    . Bunionectomy    . Laser ablation condyloma cervical / vulvar    . Tubal ligation    . Leep    . Colposcopy      Family History  Problem Relation Age of Onset  . Heart disease Maternal Grandmother   . Diabetes Father   . Heart disease Father   . Deep vein thrombosis Father   . Early death Mother   . Breast cancer Sister   . Diabetes Sister     Social History History  Substance Use Topics  . Smoking status: Never Smoker   . Smokeless tobacco: Not on file  . Alcohol Use: No    Allergies  Allergen Reactions  . Sulfa Antibiotics   . Tdap [Diphth-Acell Pertussis-Tetanus]     Current Outpatient Prescriptions  Medication Sig Dispense Refill  . albuterol (PROVENTIL HFA;VENTOLIN HFA) 108 (90 BASE) MCG/ACT inhaler Inhale 1-2 puffs into the lungs every 6 (six) hours as needed for wheezing or shortness of breath.      . ALPRAZolam (XANAX) 0.5 MG tablet Take 0.5 mg by mouth at bedtime as needed.        . cyclobenzaprine (FLEXERIL) 5 MG tablet Take 5 mg by mouth 3 (three) times daily as needed for muscle spasms.      . hydrochlorothiazide (HYDRODIURIL) 12.5 MG tablet Take 12.5 mg by mouth daily.      Marland Kitchen ibuprofen (ADVIL,MOTRIN) 200 MG tablet Take 200 mg by mouth as needed.      Marland Kitchen losartan (COZAAR) 25 MG tablet Take 25 mg by mouth daily.      . meloxicam (MOBIC) 7.5 MG tablet Take  7.5 mg by mouth daily.      . norelgestromin-ethinyl estradiol [name]) 150-35 MCG/24HR transdermal patch Place 1 patch onto the skin once a week.  3 patch  12  . zolpidem (AMBIEN) 5 MG tablet Take 10 mg by mouth at bedtime as needed.        No current facility-administered medications for this visit.    Review of Systems Review of Systems  Constitutional: Negative for unexpected weight change.  Gastrointestinal: Positive for abdominal distention (bloating).  Genitourinary: Positive for frequency and pelvic pain. Negative for vaginal discharge, vaginal pain and menstrual problem.    Blood pressure 129/82, pulse 85, temperature 97.1 F (36.2 C), temperature source Oral, height 5\' 6"  (1.676 m), weight 289 lb 9.6 oz (131.362 kg).  Physical Exam Physical Exam  Constitutional: She is oriented to person, place, and time. She appears well-developed. No distress.  obese  Neck: Normal range of motion.  Pulmonary/Chest: Effort normal. No respiratory distress.  Abdominal: Soft. She exhibits no distension and no mass. There is no tenderness.  Genitourinary: Vagina normal and uterus normal. No vaginal discharge found.  Pap  done no mass  Musculoskeletal: Normal range of motion.  Neurological: She is alert and oriented to person, place, and time.  Skin: Skin is warm and dry.  Psychiatric: She has a normal mood and affect. Her behavior is normal.    Data Reviewed notes  Assessment    Cycle control on BC patch. Pelvic pain and urinary symptoms     Plan    Patch refill, f/u pap, urine        ARNOLD,JAMES 12/17/2013, 1:32 PM

## 2013-12-17 NOTE — Telephone Encounter (Signed)
Pt called and stated that she was seen today by Dr. Roselie Awkward and she talked with her insurance and they stated that she needed prior authorization to the medication ortha Evra and to please call [phone] to complete the prior auth.  She would like for Rx to be sent to [facility].

## 2013-12-17 NOTE — Patient Instructions (Signed)
Hormonal Contraception Information Estrogen and progesterone (progestin) are hormones used in many forms of birth control (contraception). These two hormones make up most hormonal contraceptives. Hormonal contraceptives use either:   A combination of estrogen hormone and progesterone hormone in one of these forms:  Pill Pills come in various combinations of active hormone pills and nonhormonal pills. Different combinations of pills may give you a period once a month, once every 3 months, or no period at all. It is important to take the pills the same time each day.  Patch The patch is placed on the lower abdomen every week for 3 weeks. On the fourth week, the patch is not placed.  Vaginal ring The ring is placed in the vagina and left there for 3 weeks. It is then removed for 1 week.  Progesterone alone in one of these forms:  Pill Hormone pills are taken every day of the cycle.  Intrauterine device (IUD) The IUD is inserted during a menstrual period and removed or replaced every 5 years or sooner.  Implant Plastic rods are placed under the skin of the upper arm. They are removed or replaced every 3 years or sooner.  Injection The injection is given once every 90 days. Pregnancy can still occur with any of these hormonal contraceptive methods. If you have any suspicion that you might be pregnant, take a pregnancy test and talk to your health care provider.  ESTROGEN AND PROGESTERONE CONTRACEPTIVES Estrogen and progesterone contraceptives can prevent pregnancy by:  Stopping the release of an egg (ovulation).  Thickening the mucus of the cervix, making it difficult for sperm to enter the uterus.  Changing the lining of the uterus. This change makes it more difficult for an egg to implant. Side effects from estrogen occur more often in the first 2 3 months. Talk to your health care provider about what side effects may affect you. If you develop persistent side effects or they are severe,  talk to your health care provider. PROGESTERONE CONTRACEPTIVES Progesterone-only contraceptives can prevent pregnancy by:   Blocking ovulation. This occurs in many women, but some women will continue to ovulate.   Preventing the entry of sperm into the uterus by keeping the cervical mucus thick and sticky.   Changing the lining of the uterus. This change makes it more difficult for an egg to implant.  Side effects of progesterone can vary. Talk to your health care provider about what side effects may affect you. If you develop persistent side effects or they are severe, talk to your health care provider.  Document Released: 11/19/2007 Document Revised: 07/02/2013 Document Reviewed: 04/13/2013 [facility] Patient Information 2014 [place].

## 2013-12-18 ENCOUNTER — Encounter: Payer: Self-pay | Admitting: *Deleted

## 2013-12-18 LAB — URINE CULTURE
Colony Count: NO GROWTH
Organism ID, Bacteria: NO GROWTH

## 2013-12-18 NOTE — Telephone Encounter (Signed)
Medication authorization form filled out and faxed to [facility] [phone]; sent along last three visit notes as well to support the need for medication per request of authorization sheet.

## 2013-12-18 NOTE — Telephone Encounter (Signed)
Called pt. To verify insurance. Pt. States she has [employer] and her card information was taken yesterday. It has not been scanned in yet.[employer] insurance: ID number: [id number].  Called number. Requested prior authorization form-- to be faxed to clinic this morning. Informed pt. I would fill it out and fax it.

## 2013-12-30 ENCOUNTER — Encounter: Payer: Self-pay | Admitting: Obstetrics & Gynecology

## 2014-01-26 ENCOUNTER — Ambulatory Visit: Payer: Self-pay | Admitting: Obstetrics & Gynecology

## 2014-03-04 ENCOUNTER — Telehealth: Payer: Self-pay | Admitting: *Deleted

## 2014-03-04 DIAGNOSIS — Z780 Asymptomatic menopausal state: Secondary | ICD-10-CM

## 2014-03-04 MED ORDER — NORELGESTROMIN-ETH ESTRADIOL 150-35 MCG/24HR TD PTWK: 1.0000 | MEDICATED_PATCH | TRANSDERMAL | Status: DC

## 2014-03-04 NOTE — Telephone Encounter (Signed)
Pt called and would like to switch back to previous medication Gwendolyn Russell since she now has insurance.  Contacted patient, ordered medication.  No further questions.

## 2014-04-09 ENCOUNTER — Other Ambulatory Visit: Payer: Self-pay | Admitting: Obstetrics & Gynecology

## 2014-04-09 ENCOUNTER — Telehealth: Payer: Self-pay | Admitting: *Deleted

## 2014-04-09 NOTE — Telephone Encounter (Signed)
Pt called nurse line and requested that her ortho patch be refilled for the year instead of two months.

## 2014-04-09 NOTE — Telephone Encounter (Signed)
Refill handled by Dr. Roselie Awkward in encounter on same day, will close this encounter.

## 2014-04-14 ENCOUNTER — Telehealth: Payer: Self-pay

## 2014-04-14 MED ORDER — NORELGESTROMIN-ETH ESTRADIOL 150-35 MCG/24HR TD PTWK: MEDICATED_PATCH | TRANSDERMAL | Status: DC

## 2014-04-14 NOTE — Telephone Encounter (Signed)
Pt called and stated that she know has insurance and would like to have the orthra evra patch instead of the Zulan cause the Zulan was giving her headaches and that she would like for it be called into her [facility] off [address] in [place].  Called pt and informed her that her Rx has been sent to the pharmacy of her choose.  Pt stated thank you.

## 2014-04-21 ENCOUNTER — Telehealth: Payer: Self-pay | Admitting: General Practice

## 2014-04-21 NOTE — Telephone Encounter (Signed)
Patient called and left message stating she spoke with someone last week about a Rx for ortho evra patches to go to her [facility] pharmacy on [address] in [place] and her pharmacy is saying they still haven't received anything from [place]. Per chart review, Rx went through on 6/2. Called [facility] and they stated they have the Rx the patient just needs to call and tell them if she wants the brand name or the generic. Called patient, no answer- left message that we are trying to return her phone call from earlier and we have contacted the pharmacy about her medicine and they have everything they need on our end she just needs to call them to get the medication filled. If she has any other questions or concerns she may call us back at the clinics

## 2014-08-10 ENCOUNTER — Telehealth: Payer: Self-pay

## 2014-08-10 NOTE — Telephone Encounter (Signed)
Pt called and stated that the front desk gave her an appt for November 5th would like to be seen sooner because I had hot/cold flashes.  "I have hospitalized myself, I have 2 different psychiatrist, my symptoms are emotional distress, PTSD, loosing weight, fatigue, victimless, and hopelessness."   I asked pt when was the last time she saw her psychiatrist pt informed me that it was a week and half ago.  She informed me that she has panic attack at least four times a week.   I asked pt about her concern with hormonal changes and that she gets hot and cold, sweaty at times and she told me that she spoke with a pharmacist at [facility] and it sounded like it could be hormonal and he recommended that she contact her GYN.  I advised pt that she could call and to find out if there are any cancellations for the day but as far as being able to schedule a sooner appt they are filled.  I also advised pt to call her psychiatrist to inform them of what her symptoms were and that an adjustment to her medication may be needed or he would be able to give her techniques on ways to decrease anxiety level.  Pt stated understanding with no further questions.

## 2014-09-14 ENCOUNTER — Encounter: Payer: Self-pay | Admitting: Obstetrics & Gynecology

## 2014-09-17 ENCOUNTER — Ambulatory Visit: Payer: Self-pay | Admitting: Obstetrics & Gynecology

## 2014-09-24 ENCOUNTER — Encounter: Payer: Self-pay | Admitting: Obstetrics & Gynecology

## 2014-09-24 ENCOUNTER — Ambulatory Visit (INDEPENDENT_AMBULATORY_CARE_PROVIDER_SITE_OTHER): Payer: BC Managed Care – PPO | Admitting: Obstetrics & Gynecology

## 2014-09-24 VITALS — BP 122/64 | HR 76 | Temp 97.7°F | Ht 66.0 in | Wt 290.8 lb

## 2014-09-24 DIAGNOSIS — R61 Generalized hyperhidrosis: Secondary | ICD-10-CM | POA: Diagnosis not present

## 2014-09-24 DIAGNOSIS — Z3045 Encounter for surveillance of transdermal patch hormonal contraceptive device: Secondary | ICD-10-CM

## 2014-09-24 DIAGNOSIS — Z308 Encounter for other contraceptive management: Secondary | ICD-10-CM

## 2014-09-24 MED ORDER — NORELGESTROMIN-ETH ESTRADIOL 150-35 MCG/24HR TD PTWK: MEDICATED_PATCH | TRANSDERMAL | Status: DC

## 2014-09-24 NOTE — Progress Notes (Signed)
Subjective:     Patient ID: [name], female   DOB: 12-22-1965, 48 y.o.   MRN: [id number]  Female GU Problem The patient's primary symptoms include pelvic pain. The patient's pertinent negatives include no vaginal bleeding or vaginal discharge. The pain is severe. She uses a patch for contraception. Her menstrual history has been regular.   stopped her Cymbalta and started getting sweats despite still using Ortho Evra. Dark brown flow may last 2 weeks and has pain    Review of Systems  Constitutional: Negative.   Genitourinary: Positive for menstrual problem and pelvic pain. Negative for vaginal discharge.       Objective:   Physical Exam  Constitutional: She appears well-developed.  obese  Skin: Skin is warm and dry.  Psychiatric: She has a normal mood and affect. Her behavior is normal.       Assessment:     Hot flushes could be due to meds. Dysmenorrhea on Ortho Evra     Plan:     OE patch continuous and RTC  09/24/2014 Woodroe Mode, MD

## 2014-09-24 NOTE — Patient Instructions (Signed)
Contraception Choices Contraception (birth control) is the use of any methods or devices to prevent pregnancy. Below are some methods to help avoid pregnancy. HORMONAL METHODS   Contraceptive implant. This is a thin, plastic tube containing progesterone hormone. It does not contain estrogen hormone. Your health care provider inserts the tube in the inner part of the upper arm. The tube can remain in place for up to 3 years. After 3 years, the implant must be removed. The implant prevents the ovaries from releasing an egg (ovulation), thickens the cervical mucus to prevent sperm from entering the uterus, and thins the lining of the inside of the uterus.  Progesterone-only injections. These injections are given every 3 months by your health care provider to prevent pregnancy. This synthetic progesterone hormone stops the ovaries from releasing eggs. It also thickens cervical mucus and changes the uterine lining. This makes it harder for sperm to survive in the uterus.  Birth control pills. These pills contain estrogen and progesterone hormone. They work by preventing the ovaries from releasing eggs (ovulation). They also cause the cervical mucus to thicken, preventing the sperm from entering the uterus. Birth control pills are prescribed by a health care provider.Birth control pills can also be used to treat heavy periods.  Minipill. This type of birth control pill contains only the progesterone hormone. They are taken every day of each month and must be prescribed by your health care provider.  Birth control patch. The patch contains hormones similar to those in birth control pills. It must be changed once a week and is prescribed by a health care provider.  Vaginal ring. The ring contains hormones similar to those in birth control pills. It is left in the vagina for 3 weeks, removed for 1 week, and then a new one is put back in place. The patient must be comfortable inserting and removing the ring  from the vagina.A health care provider's prescription is necessary.  Emergency contraception. Emergency contraceptives prevent pregnancy after unprotected sexual intercourse. This pill can be taken right after sex or up to 5 days after unprotected sex. It is most effective the sooner you take the pills after having sexual intercourse. Most emergency contraceptive pills are available without a prescription. Check with your pharmacist. Do not use emergency contraception as your only form of birth control. BARRIER METHODS   Female condom. This is a thin sheath (latex or rubber) that is worn over the penis during sexual intercourse. It can be used with spermicide to increase effectiveness.  Female condom. This is a soft, loose-fitting sheath that is put into the vagina before sexual intercourse.  Diaphragm. This is a soft, latex, dome-shaped barrier that must be fitted by a health care provider. It is inserted into the vagina, along with a spermicidal jelly. It is inserted before intercourse. The diaphragm should be left in the vagina for 6 to 8 hours after intercourse.  Cervical cap. This is a round, soft, latex or plastic cup that fits over the cervix and must be fitted by a health care provider. The cap can be left in place for up to 48 hours after intercourse.  Sponge. This is a soft, circular piece of polyurethane foam. The sponge has spermicide in it. It is inserted into the vagina after wetting it and before sexual intercourse.  Spermicides. These are chemicals that kill or block sperm from entering the cervix and uterus. They come in the form of creams, jellies, suppositories, foam, or tablets. They do not require a   prescription. They are inserted into the vagina with an applicator before having sexual intercourse. The process must be repeated every time you have sexual intercourse. INTRAUTERINE CONTRACEPTION  Intrauterine device (IUD). This is a T-shaped device that is put in a woman's uterus  during a menstrual period to prevent pregnancy. There are 2 types:  Copper IUD. This type of IUD is wrapped in copper wire and is placed inside the uterus. Copper makes the uterus and fallopian tubes produce a fluid that kills sperm. It can stay in place for 10 years.  Hormone IUD. This type of IUD contains the hormone progestin (synthetic progesterone). The hormone thickens the cervical mucus and prevents sperm from entering the uterus, and it also thins the uterine lining to prevent implantation of a fertilized egg. The hormone can weaken or kill the sperm that get into the uterus. It can stay in place for 3-5 years, depending on which type of IUD is used. PERMANENT METHODS OF CONTRACEPTION  Female tubal ligation. This is when the woman's fallopian tubes are surgically sealed, tied, or blocked to prevent the egg from traveling to the uterus.  Hysteroscopic sterilization. This involves placing a small coil or insert into each fallopian tube. Your doctor uses a technique called hysteroscopy to do the procedure. The device causes scar tissue to form. This results in permanent blockage of the fallopian tubes, so the sperm cannot fertilize the egg. It takes about 3 months after the procedure for the tubes to become blocked. You must use another form of birth control for these 3 months.  Female sterilization. This is when the female has the tubes that carry sperm tied off (vasectomy).This blocks sperm from entering the vagina during sexual intercourse. After the procedure, the man can still ejaculate fluid (semen). NATURAL PLANNING METHODS  Natural family planning. This is not having sexual intercourse or using a barrier method (condom, diaphragm, cervical cap) on days the woman could become pregnant.  Calendar method. This is keeping track of the length of each menstrual cycle and identifying when you are fertile.  Ovulation method. This is avoiding sexual intercourse during ovulation.  Symptothermal  method. This is avoiding sexual intercourse during ovulation, using a thermometer and ovulation symptoms.  Post-ovulation method. This is timing sexual intercourse after you have ovulated. Regardless of which type or method of contraception you choose, it is important that you use condoms to protect against the transmission of sexually transmitted infections (STIs). Talk with your health care provider about which form of contraception is most appropriate for you. Document Released: 10/30/2005 Document Revised: 11/04/2013 Document Reviewed: 04/24/2013 [employer] Patient Information 2015 [employer]. This information is not intended to replace advice given to you by your health care provider. Make sure you discuss any questions you have with your health care provider.  

## 2015-05-13 DIAGNOSIS — F419 Anxiety disorder, unspecified: Secondary | ICD-10-CM | POA: Diagnosis present

## 2015-05-28 ENCOUNTER — Emergency Department (HOSPITAL_BASED_OUTPATIENT_CLINIC_OR_DEPARTMENT_OTHER): Payer: BLUE CROSS/BLUE SHIELD

## 2015-05-28 ENCOUNTER — Emergency Department (HOSPITAL_BASED_OUTPATIENT_CLINIC_OR_DEPARTMENT_OTHER)
Admission: EM | Admit: 2015-05-28 | Discharge: 2015-05-28 | Disposition: A | Discharge status: Home / Self Care | Payer: BLUE CROSS/BLUE SHIELD | Attending: Emergency Medicine | Admitting: Emergency Medicine

## 2015-05-28 ENCOUNTER — Encounter (HOSPITAL_BASED_OUTPATIENT_CLINIC_OR_DEPARTMENT_OTHER): Payer: Self-pay | Admitting: *Deleted

## 2015-05-28 DIAGNOSIS — E669 Obesity, unspecified: Secondary | ICD-10-CM | POA: Insufficient documentation

## 2015-05-28 DIAGNOSIS — Z86718 Personal history of other venous thrombosis and embolism: Secondary | ICD-10-CM | POA: Insufficient documentation

## 2015-05-28 DIAGNOSIS — Z8719 Personal history of other diseases of the digestive system: Secondary | ICD-10-CM | POA: Diagnosis not present

## 2015-05-28 DIAGNOSIS — G4700 Insomnia, unspecified: Secondary | ICD-10-CM | POA: Insufficient documentation

## 2015-05-28 DIAGNOSIS — F419 Anxiety disorder, unspecified: Secondary | ICD-10-CM | POA: Insufficient documentation

## 2015-05-28 DIAGNOSIS — R200 Anesthesia of skin: Secondary | ICD-10-CM | POA: Diagnosis not present

## 2015-05-28 DIAGNOSIS — I10 Essential (primary) hypertension: Secondary | ICD-10-CM | POA: Insufficient documentation

## 2015-05-28 DIAGNOSIS — R0789 Other chest pain: Secondary | ICD-10-CM | POA: Insufficient documentation

## 2015-05-28 DIAGNOSIS — Z79899 Other long term (current) drug therapy: Secondary | ICD-10-CM | POA: Insufficient documentation

## 2015-05-28 DIAGNOSIS — R079 Chest pain, unspecified: Secondary | ICD-10-CM | POA: Diagnosis present

## 2015-05-28 LAB — COMPREHENSIVE METABOLIC PANEL
ALT: 19 U/L (ref 14–54)
ANION GAP: 9 (ref 5–15)
AST: 17 U/L (ref 15–41)
Albumin: 3.3 g/dL — ABNORMAL LOW (ref 3.5–5.0)
Alkaline Phosphatase: 77 U/L (ref 38–126)
BILIRUBIN TOTAL: 0.4 mg/dL (ref 0.3–1.2)
BUN: 20 mg/dL (ref 6–20)
CO2: 29 mmol/L (ref 22–32)
Calcium: 9.1 mg/dL (ref 8.9–10.3)
Chloride: 102 mmol/L (ref 101–111)
Creatinine, Ser: 0.92 mg/dL (ref 0.44–1.00)
GFR calc Af Amer: >60 mL/min (ref >60)
Glucose, Bld: 102 mg/dL — ABNORMAL HIGH (ref 65–99)
Potassium: 3.4 mmol/L — ABNORMAL LOW (ref 3.5–5.1)
SODIUM: 140 mmol/L (ref 135–145)
TOTAL PROTEIN: 7.3 g/dL (ref 6.5–8.1)

## 2015-05-28 LAB — CBC WITH DIFFERENTIAL/PLATELET
BASOS PCT: 0 % (ref 0–1)
Basophils Absolute: 0 10*3/uL (ref 0.0–0.1)
EOS PCT: 1 % (ref 0–5)
Eosinophils Absolute: 0.1 10*3/uL (ref 0.0–0.7)
HEMATOCRIT: 36.8 % (ref 36.0–46.0)
HEMOGLOBIN: 11.3 g/dL — AB (ref 12.0–15.0)
LYMPHS ABS: 2 10*3/uL (ref 0.7–4.0)
Lymphocytes Relative: 23 % (ref 12–46)
MCH: 26.5 pg (ref 26.0–34.0)
MCHC: 30.7 g/dL (ref 30.0–36.0)
MCV: 86.4 fL (ref 78.0–100.0)
Monocytes Absolute: 0.6 10*3/uL (ref 0.1–1.0)
Monocytes Relative: 7 % (ref 3–12)
Neutro Abs: 6.1 10*3/uL (ref 1.7–7.7)
Neutrophils Relative %: 69 % (ref 43–77)
Platelets: 289 10*3/uL (ref 150–400)
RBC: 4.26 MIL/uL (ref 3.87–5.11)
RDW: 13.2 % (ref 11.5–15.5)
WBC: 8.8 10*3/uL (ref 4.0–10.5)

## 2015-05-28 LAB — TROPONIN I: Troponin I: <0.03 ng/mL (ref <0.031)

## 2015-05-28 MED ORDER — ASPIRIN 81 MG PO CHEW
324.0000 mg | CHEWABLE_TABLET | Freq: Once | ORAL | Status: AC
  Administered 2015-05-28: 324 mg via ORAL
  Filled 2015-05-28: qty 4

## 2015-05-28 MED ORDER — NITROGLYCERIN 0.4 MG SL SUBL
0.4000 mg | SUBLINGUAL_TABLET | SUBLINGUAL | Status: DC | PRN
  Administered 2015-05-28 (×2): 0.4 mg via SUBLINGUAL
  Filled 2015-05-28: qty 1

## 2015-05-28 NOTE — ED Notes (Addendum)
Chest pressure. Numbness on her left side on and off today but constant for an hour. She is alert oriented. States she had a steroid injection in her back a month ago.

## 2015-05-28 NOTE — ED Provider Notes (Signed)
CSN: [id number]     Arrival date & time 05/28/15  1843 History  This chart was scribed for Veryl Speak, MD by Stephania Fragmin, ED Scribe. This patient was seen in room MH07/MH07 and the patient's care was started at 7:04 PM.    Chief Complaint  Patient presents with  . Chest Pain   Patient is a 49 y.o. female presenting with chest pain. The history is provided by the patient. No language interpreter was used.  Chest Pain Pain location:  Substernal area Pain quality: pressure   Pain radiates to:  Does not radiate Pain radiates to the back: no   Pain severity:  Moderate Onset quality:  Gradual Duration:  2 hours Timing:  Constant Progression:  Unchanged Chronicity:  New Context: at rest   Relieved by:  None tried Worsened by:  Nothing tried Ineffective treatments:  None tried Associated symptoms: headache and numbness   Risk factors: hypertension and obesity   Risk factors: no high cholesterol     HPI Comments: Gwendolyn Russell is a 49 y.o. female who presents to the Emergency Department complaining of intermittent numbness on her left side that began about 2.5 hours ago, when patient was at the movies. She states the numbness was near constant for an hour, as although rubbing her arm or leg madethe numbness go away temporarily; it quickly returned after she stopped rubbing. Patient then began to have a pressure-like chest pain which she characterizes as feeling like something is stuck in the middle of her chest. She also notes she had an intermittent headache over the past week. This is a new problem. She denies a history of hyperlipidemia or CVA. Patient is on Tribenzor 40-10-12.5 for HTN. Patient also reports she has chronic intermittent tremors, which is normal for her.    Past Medical History  Diagnosis Date  . Hypertension   . Anxiety   . Insomnia   . GERD (gastroesophageal reflux disease)   . Abnormal Pap smear   . DVT of leg (deep venous thrombosis), left   . Obesity    Past  Surgical History  Procedure Laterality Date  . Tonsillectomy    . Carpal tunnel release    . Bunionectomy    . Laser ablation condyloma cervical / vulvar    . Tubal ligation    . Leep    . Colposcopy     Family History  Problem Relation Age of Onset  . Heart disease Maternal Grandmother   . Diabetes Father   . Heart disease Father   . Deep vein thrombosis Father   . Early death Mother   . Breast cancer Sister   . Diabetes Sister    History  Substance Use Topics  . Smoking status: Never Smoker   . Smokeless tobacco: Not on file  . Alcohol Use: No   OB History    Gravida Para Term Preterm AB TAB SAB Ectopic Multiple Living   2 2 2       2      Review of Systems  Cardiovascular: Positive for chest pain.  Neurological: Positive for numbness and headaches.  All other systems reviewed and are negative.   Allergies  Cephalosporins; Sulfa antibiotics; and Tdap  Home Medications   Prior to Admission medications   Medication Sig Start Date End Date Taking? Authorizing Provider  albuterol (PROVENTIL HFA;VENTOLIN HFA) 108 (90 BASE) MCG/ACT inhaler Inhale 1-2 puffs into the lungs every 6 (six) hours as needed for wheezing or shortness of breath.  Historical Provider, MD  ALPRAZolam Duanne Moron) 0.5 MG tablet Take 0.5 mg by mouth at bedtime as needed.      Historical Provider, MD  cyclobenzaprine (FLEXERIL) 5 MG tablet Take 5 mg by mouth 3 (three) times daily as needed for muscle spasms.    Historical Provider, MD  ibuprofen (ADVIL,MOTRIN) 200 MG tablet Take 200 mg by mouth as needed.    Historical Provider, MD  metoprolol (LOPRESSOR) 50 MG tablet Take 50 mg by mouth 2 (two) times daily.    Historical Provider, MD  norelgestromin-ethinyl estradiol [name]) 150-35 MCG/24HR transdermal patch PLACE 1 PATCH ONTO THE SKIN ONCE A WEEK. Do not withdraw after 3 weeks 09/24/14   Woodroe Mode, MD  Olmesartan-Amlodipine-HCTZ [facility]) 40-10-12.5 MG TABS Take by mouth.    Historical Provider,  MD  temazepam (RESTORIL) 15 MG capsule Take 15 mg by mouth at bedtime as needed for sleep.    Historical Provider, MD  tiZANidine (ZANAFLEX) 2 MG tablet Take 2 mg by mouth 3 (three) times daily.    Historical Provider, MD  topiramate (TOPAMAX) 50 MG tablet Take 50 mg by mouth daily.    Historical Provider, MD  zolpidem (AMBIEN) 5 MG tablet Take 10 mg by mouth at bedtime as needed.     Historical Provider, MD   BP 117/73 mmHg  Pulse 95  Temp(Src) 98.2 F (36.8 C) (Oral)  Resp 20  Ht 5\' 6"  (1.676 m)  Wt 286 lb (129.729 kg)  BMI 46.18 kg/m2  SpO2 97% Physical Exam  Constitutional: She is oriented to person, place, and time. She appears well-developed and well-nourished. No distress.  HENT:  Head: Normocephalic and atraumatic.  Right Ear: Hearing normal.  Left Ear: Hearing normal.  Nose: Nose normal.  Mouth/Throat: Oropharynx is clear and moist and mucous membranes are normal.  Eyes: Conjunctivae and EOM are normal. Pupils are equal, round, and reactive to light.  Neck: Normal range of motion. Neck supple. No tracheal deviation present.  Cardiovascular: Normal rate, regular rhythm, S1 normal and S2 normal.  Exam reveals no gallop and no friction rub.   No murmur heard. Pulmonary/Chest: Effort normal and breath sounds normal. No respiratory distress. She exhibits no tenderness.  Abdominal: Soft. Normal appearance and bowel sounds are normal. There is no hepatosplenomegaly. There is no tenderness. There is no rebound, no guarding, no tenderness at McBurney's point and negative Murphy's sign. No hernia.  Musculoskeletal: Normal range of motion.  Neurological: She is alert and oriented to person, place, and time. She has normal strength. No cranial nerve deficit or sensory deficit. Coordination normal. GCS eye subscore is 4. GCS verbal subscore is 5. GCS motor subscore is 6.  Skin: Skin is warm, dry and intact. No rash noted. No cyanosis.  Psychiatric: She has a normal mood and affect. Her  speech is normal and behavior is normal. Thought content normal.  Nursing note and vitals reviewed.   ED Course  Procedures (including critical care time)   DIAGNOSTIC STUDIES: Oxygen Saturation is 97% on RA, normal by my interpretation.    COORDINATION OF CARE: 7:09 PM - Discussed treatment plan with pt at bedside which includes CT head, and pt agreed to plan.   Labs Review Labs Reviewed  COMPREHENSIVE METABOLIC PANEL - Abnormal; Notable for the following:    Potassium 3.4 (*)    Glucose, Bld 102 (*)    Albumin 3.3 (*)    All other components within normal limits  CBC WITH DIFFERENTIAL/PLATELET - Abnormal; Notable for the  following:    Hemoglobin 11.3 (*)    All other components within normal limits  TROPONIN I    Imaging Review Dg Chest 2 View  05/28/2015   CLINICAL DATA:  Chest pressure, left side numbness  EXAM: CHEST  2 VIEW  COMPARISON:  10/10/2011  FINDINGS: Cardiomediastinal silhouette is stable. No acute infiltrate or pleural effusion. No pulmonary edema. Mild degenerative changes lower thoracic spine.  IMPRESSION: No active cardiopulmonary disease.   Electronically Signed   By: Lahoma Crocker M.D.   On: 05/28/2015 20:50   Ct Head Wo Contrast  05/28/2015   CLINICAL DATA:  49 year old female with numbness on her left side  EXAM: CT HEAD WITHOUT CONTRAST  TECHNIQUE: Contiguous axial images were obtained from the base of the skull through the vertex without intravenous contrast.  COMPARISON:  None.  FINDINGS: The ventricles and the sulci are appropriate in size for the patient's age. There is no intracranial hemorrhage. No midline shift or mass effect identified. The gray-white matter differentiation is preserved.  The visualized paranasal sinuses and mastoid air cells are well aerated. The calvarium is intact.  IMPRESSION: No acute intracranial pathology.   Electronically Signed   By: Anner Crete M.D.   On: 05/28/2015 20:42    ED ECG REPORT   Date: 05/28/2015  Rate: 91   Rhythm: normal sinus rhythm  QRS Axis: normal  Intervals: normal  ST/T Wave abnormalities: normal  Conduction Disutrbances:none  Narrative Interpretation:   Old EKG Reviewed: none available  I have personally reviewed the EKG tracing and agree with the computerized printout as noted.   MDM   Final diagnoses:  None    Patient presents here with multiple complaints including tightness in her chest, numbness to her left arm that has been occurring since earlier this evening. She denies any injury or trauma and denies any shortness of breath. Her physical examination is unremarkable and neurologic exam is nonfocal. Head CT is negative. She is also complaining of tightness in her chest which does not sound cardiac in nature. She has no prior cardiac history and EKG and troponin are negative. She is now feeling better and I believe is appropriate for discharge. I am uncertain as to the exact etiology of her symptoms, however I highly doubt a CVA or cardiac etiology. There may be some component of anxiety.   I personally performed the services described in this documentation, which was scribed in my presence. The recorded information has been reviewed and is accurate.      Veryl Speak, MD 05/28/15 2158

## 2015-05-28 NOTE — ED Notes (Signed)
FAST assessment: negative 

## 2015-05-28 NOTE — ED Notes (Signed)
Immediately placed on cardiac monitor with cont POX and NBP

## 2015-05-28 NOTE — ED Notes (Signed)
MD at bedside. 

## 2015-05-28 NOTE — ED Notes (Signed)
Presents with Chest Pain, ant non radiating, "pressure"

## 2015-05-28 NOTE — Discharge Instructions (Signed)
Follow-up with your primary Dr. if not improving in the next 2 days, and return to the ER symptoms significantly worsen or change.   Chest Pain (Nonspecific) It is often hard to give a specific diagnosis for the cause of chest pain. There is always a chance that your pain could be related to something serious, such as a heart attack or a blood clot in the lungs. You need to follow up with your health care provider for further evaluation. CAUSES   Heartburn.  Pneumonia or bronchitis.  Anxiety or stress.  Inflammation around your heart (pericarditis) or lung (pleuritis or pleurisy).  A blood clot in the lung.  A collapsed lung (pneumothorax). It can develop suddenly on its own (spontaneous pneumothorax) or from trauma to the chest.  Shingles infection (herpes zoster virus). The chest wall is composed of bones, muscles, and cartilage. Any of these can be the source of the pain.  The bones can be bruised by injury.  The muscles or cartilage can be strained by coughing or overwork.  The cartilage can be affected by inflammation and become sore (costochondritis). DIAGNOSIS  Lab tests or other studies may be needed to find the cause of your pain. Your health care provider may have you take a test called an ambulatory electrocardiogram (ECG). An ECG records your heartbeat patterns over a 24-hour period. You may also have other tests, such as:  Transthoracic echocardiogram (TTE). During echocardiography, sound waves are used to evaluate how blood flows through your heart.  Transesophageal echocardiogram (TEE).  Cardiac monitoring. This allows your health care provider to monitor your heart rate and rhythm in real time.  Holter monitor. This is a portable device that records your heartbeat and can help diagnose heart arrhythmias. It allows your health care provider to track your heart activity for several days, if needed.  Stress tests by exercise or by giving medicine that makes the  heart beat faster. TREATMENT   Treatment depends on what may be causing your chest pain. Treatment may include:  Acid blockers for heartburn.  Anti-inflammatory medicine.  Pain medicine for inflammatory conditions.  Antibiotics if an infection is present.  You may be advised to change lifestyle habits. This includes stopping smoking and avoiding alcohol, caffeine, and chocolate.  You may be advised to keep your head raised (elevated) when sleeping. This reduces the chance of acid going backward from your stomach into your esophagus. Most of the time, nonspecific chest pain will improve within 2-3 days with rest and mild pain medicine.  HOME CARE INSTRUCTIONS   If antibiotics were prescribed, take them as directed. Finish them even if you start to feel better.  For the next few days, avoid physical activities that bring on chest pain. Continue physical activities as directed.  Do not use any tobacco products, including cigarettes, chewing tobacco, or electronic cigarettes.  Avoid drinking alcohol.  Only take medicine as directed by your health care provider.  Follow your health care provider's suggestions for further testing if your chest pain does not go away.  Keep any follow-up appointments you made. If you do not go to an appointment, you could develop lasting (chronic) problems with pain. If there is any problem keeping an appointment, call to reschedule. SEEK MEDICAL CARE IF:   Your chest pain does not go away, even after treatment.  You have a rash with blisters on your chest.  You have a fever. SEEK IMMEDIATE MEDICAL CARE IF:   You have increased chest pain or pain  that spreads to your arm, neck, jaw, back, or abdomen.  You have shortness of breath.  You have an increasing cough, or you cough up blood.  You have severe back or abdominal pain.  You feel nauseous or vomit.  You have severe weakness.  You faint.  You have chills. This is an emergency. Do  not wait to see if the pain will go away. Get medical help at once. Call your local emergency services (911 in [place] Do not drive yourself to the hospital. MAKE SURE YOU:   Understand these instructions.  Will watch your condition.  Will get help right away if you are not doing well or get worse. Document Released: 08/09/2005 Document Revised: 11/04/2013 Document Reviewed: 06/04/2008 [employer] Patient Information 2015 [place], [place]. This information is not intended to replace advice given to you by your health care provider. Make sure you discuss any questions you have with your health care provider.

## 2015-07-15 DIAGNOSIS — M5440 Lumbago with sciatica, unspecified side: Secondary | ICD-10-CM | POA: Diagnosis present

## 2015-07-22 ENCOUNTER — Telehealth: Payer: Self-pay | Admitting: *Deleted

## 2015-07-22 NOTE — Telephone Encounter (Addendum)
Patient states she is on the Xulane patch and has been spotting between periods x3 months. Wants to know if she can get a different brand or needs a different birth control option.  07/26/15 @ [phone]- Patient called again, stated her bleeding is worse, more like a menstrual cycle. Requests call back to talk and see if she needs to be seen.  07/26/15     1515  Called pt and discussed her concern. She states that she has been having a lot of spotting every day and sometimes it is heavier than spotting. The discharge is mostly dark brown/black but sometimes is red.  She confirms that she is using one Xulane patch weekly does not leave patch off in order to have withdrawal bleed. She denies pain and further states that she never had this type of bleeding when she was using Ortho-evra.  I advised that this situation is not an emergency however must surely be a nuisance for her. She agreed. I stated that she needs follow up appt with Dr. Roselie Awkward since she was last seen in November 2015. One of our staff will call her with appt details.  She stated that a voice mail can be left.  Pt voiced understanding of all information given.  Diane Day RNC

## 2015-08-02 ENCOUNTER — Other Ambulatory Visit: Payer: Self-pay | Admitting: Obstetrics & Gynecology

## 2015-08-25 ENCOUNTER — Encounter: Payer: Self-pay | Admitting: Obstetrics & Gynecology

## 2015-08-25 ENCOUNTER — Ambulatory Visit (INDEPENDENT_AMBULATORY_CARE_PROVIDER_SITE_OTHER): Payer: BLUE CROSS/BLUE SHIELD | Admitting: Obstetrics & Gynecology

## 2015-08-25 VITALS — BP 101/59 | HR 93 | Temp 97.9°F | Resp 20 | Ht 66.0 in | Wt 285.6 lb

## 2015-08-25 DIAGNOSIS — N920 Excessive and frequent menstruation with regular cycle: Secondary | ICD-10-CM | POA: Diagnosis not present

## 2015-08-25 NOTE — Progress Notes (Signed)
Pt states she had an episode related to the Zulane patch @ the beginning of September- brown spotting which became heavy.  This lasted for 3 weeks followed by a menstrual cycle. She has not worn a patch since 08/13/15

## 2015-08-25 NOTE — Patient Instructions (Signed)

## 2015-08-26 ENCOUNTER — Telehealth: Payer: Self-pay | Admitting: *Deleted

## 2015-08-26 LAB — FOLLICLE STIMULATING HORMONE: FSH: 1.8 m[IU]/mL

## 2015-08-26 NOTE — Telephone Encounter (Signed)
Received a message left on nurse line 08/26/15 at 1109.  Patient states she was seen yesterday by Dr. Roselie Awkward and they discussed a patch.  States her last period was 08/13/15.  States she woke up this morning and is having a period again.  Is concerned about whether to apply the patch and wants to know blood test results from yesterday.  Requests a return call.

## 2015-08-31 NOTE — Telephone Encounter (Addendum)
Called patient and someone answered and mumbled something and then disconnected. I called back and left voicemail for patient to call us back if she still needs assistance.   10/19  1645  Called pt and stated that I am following up on her concern from last week. We called yesterday but were unable to speak with her. Please call back if she still has questions or needs direction about her medication.  Diane Day RNC            1700  Per Dr. Roselie Awkward, pt should stop using the Xulane patch and begin taking Provera 20 mg daily to stop her bleeding.  Rx sent to her pharmacy. She will need follow up appt with him in 6 weeks.  Her Kraemer was normal - she is perimenopausal and this is the reason for her bleeding. Pt should consult her PCP for management of her headaches.  Diane Day RNC

## 2015-09-01 MED ORDER — MEDROXYPROGESTERONE ACETATE 10 MG PO TABS: ORAL_TABLET | ORAL | Status: DC

## 2015-09-01 NOTE — Progress Notes (Signed)
Patient ID: Gwendolyn Russell, female   DOB: 11/06/1966, 49 y.o.   MRN: [id number]  Chief Complaint  Patient presents with  . Follow-up    HPI Gwendolyn Russell is a 49 y.o. female.  Still with menstrual problem using Zulane patch. Heavy period and pain  S/p endometrial ablation HPI  Past Medical History  Diagnosis Date  . Hypertension   . Anxiety   . Insomnia   . GERD (gastroesophageal reflux disease)   . Abnormal Pap smear   . DVT of leg (deep venous thrombosis), left   . Obesity     Past Surgical History  Procedure Laterality Date  . Tonsillectomy    . Carpal tunnel release    . Bunionectomy    . Laser ablation condyloma cervical / vulvar    . Tubal ligation    . Leep    . Colposcopy      Family History  Problem Relation Age of Onset  . Heart disease Maternal Grandmother   . Diabetes Father   . Heart disease Father   . Deep vein thrombosis Father   . Early death Mother   . Breast cancer Sister   . Diabetes Sister     Social History Social History  Substance Use Topics  . Smoking status: Never Smoker   . Smokeless tobacco: None  . Alcohol Use: No    Allergies  Allergen Reactions  . Cephalosporins Hives  . Sulfa Antibiotics   . Tdap [Diphth-Acell Pertussis-Tetanus]     Current Outpatient Prescriptions  Medication Sig Dispense Refill  . albuterol (PROVENTIL HFA;VENTOLIN HFA) 108 (90 BASE) MCG/ACT inhaler Inhale 1-2 puffs into the lungs every 6 (six) hours as needed for wheezing or shortness of breath.    . ALPRAZolam (XANAX) 1 MG tablet Take 1 mg by mouth 3 (three) times daily.    . chlorzoxazone (PARAFON) 500 MG tablet Take by mouth 2 (two) times daily as needed for muscle spasms.    . cyproheptadine (PERIACTIN) 4 MG tablet Take 8 mg by mouth at bedtime.    Gwendolyn Russell FLUoxetine (PROZAC) 40 MG capsule Take 40 mg by mouth daily.    Gwendolyn Russell gabapentin (NEURONTIN) 300 MG capsule Take 300 mg by mouth 3 (three) times daily.    . mirtazapine (REMERON) 15 MG tablet Take 15  mg by mouth at bedtime.    . Olmesartan-Amlodipine-HCTZ (TRIBENZOR) 20-5-12.5 MG TABS Take 1 tablet by mouth daily.    Gwendolyn Russell omeprazole (PRILOSEC) 40 MG capsule Take 40 mg by mouth daily.    . prazosin (MINIPRESS) 2 MG capsule Take 2 mg by mouth at bedtime.    . ranitidine (ZANTAC) 150 MG tablet Take 150 mg by mouth 2 (two) times daily.    Gwendolyn Russell topiramate (TOPAMAX) 50 MG tablet Take 50 mg by mouth daily.    . traMADol (ULTRAM) 50 MG tablet Take by mouth daily as needed.    Gwendolyn Russell 150-35 MCG/24HR transdermal patch APPLY ONE PATCH TOPICALLY ONCE A WEEK. DO NOT WITHDRAW AFTER 3 WEEKS *REMOVE  OLD  PATCH* 3 patch 0  . zolpidem (AMBIEN) 5 MG tablet Take 10 mg by mouth at bedtime as needed.      No current facility-administered medications for this visit.    Review of Systems Review of Systems  Genitourinary: Positive for menstrual problem and pelvic pain (cramps).    Blood pressure 101/59, pulse 93, temperature 97.9 F (36.6 C), temperature source Oral, resp. rate 20, height 5\' 6"  (1.676 m), weight 285 lb  9.6 oz (129.547 kg).  Physical Exam Physical Exam  Constitutional: She is oriented to person, place, and time. She appears well-developed. No distress.  Neurological: She is alert and oriented to person, place, and time.  Psychiatric: She has a normal mood and affect. Her behavior is normal.    Data Reviewed [place] result  Assessment    Dysmenorrhea on Zulane patch, did better on OE which is discontinued     Plan    [facility] today Consider stopping patch and RTC to review her progress        ARNOLD,JAMES 09/01/2015, 4:58 PM

## 2015-09-01 NOTE — Telephone Encounter (Signed)
Late entry -  Received message left on nurse line 09/01/15 at 1412.  Patient states she was in the office a week or so ago and had lab work.  States she and Dr. Roselie Awkward discussed stopping her patch.  States she didn't think about it at the time but that the patch was also to help control her migraines.  States she has had a headache for a week or so now and just started to remember the patch was to help control that.  Wants to know if she can restart the patch to assist with headaches.    Diane Day, RN had already spoken with Dr. Roselie Awkward and attempted to contact patient.

## 2015-09-02 ENCOUNTER — Other Ambulatory Visit: Payer: Self-pay | Admitting: Obstetrics & Gynecology

## 2015-09-03 NOTE — Telephone Encounter (Signed)
Received message left on nurse line on 09/02/15 at 1221.  Patient states she keeps missing our call due to sleeping because of her headache.  States she needs her lab results and wants to know what Dr. Roselie Awkward wants her to do about the patch and her headaches.  Requests a return call.

## 2015-09-06 NOTE — Telephone Encounter (Signed)
Received call left on nurse line on 09/06/15 at 1354.  Patient states she is a patient of Dr. Roselie Awkward.  He had prescribed patches for her in the past that were to regulate her cycles and to help with migraines.  States she had the ringer on her phone turned down all last week because she had such a bad headache.  States she put the patch back on 09/05/15 and today her headache is gone.  States she is having bariatric surgery soon.  Would like for Dr. Roselie Awkward to call in prescription for the patches.  Once she has had her surgery she will look into what to do about the bleeding but right now she doesn't want to change medications as the headaches are so bad.  Wants prescription phoned to [facility] [address] in [place].  Requests a return call.

## 2015-09-07 MED ORDER — NORELGESTROMIN-ETH ESTRADIOL 150-35 MCG/24HR TD PTWK: MEDICATED_PATCH | TRANSDERMAL | Status: DC

## 2015-09-07 NOTE — Telephone Encounter (Signed)
Consult with Dr. Roselie Awkward via phone and informed him that we have not been able to contact pt regarding the plan of care for Provera instead of Xulane patch. We have received calls from the pt stating that she has resumed the Xulane patch because of her severe headache and the headache has resolved. She would like to continue with the patch until after her bariatric surgery. He agreed to refill Xulane patch for pt and advised that she schedule f/u appt with him after her bariatric surgery. Called pt and informed her that Rx has been sent to her pharmacy for [facility] patch. After the bariatric surgery she should see her PCP in reference to her headaches and also will need appt with Dr. Roselie Awkward to f/u abnormal vaginal bleeding - most likely due to peri-menopause. It is possible that Dr. Roselie Awkward will want to try a different hormonal medication to control her vaginal bleeding so it is very important that she consult her PCP for treatment of her headaches. Pt voiced understanding of all information and instructions given.

## 2015-10-04 ENCOUNTER — Telehealth: Payer: Self-pay

## 2015-10-04 MED ORDER — FLUCONAZOLE 150 MG PO TABS: 150.0000 mg | ORAL_TABLET | Freq: Once | ORAL | Status: DC

## 2015-10-04 NOTE — Telephone Encounter (Signed)
Pt requested if she could have diflucan ordered with a couple refills because she taking antibiotics which usually gives her a yeast infection.   Pt also stated that she currently has a yeast infection as well.  Called pt and informed her that the medication that she requested is at her [place] [address] stated "thank you so much" with no further questions.

## 2015-10-11 HISTORY — PX: LAPAROSCOPIC GASTRIC SLEEVE RESECTION: SHX5895

## 2015-11-19 ENCOUNTER — Encounter: Payer: Self-pay | Admitting: Obstetrics & Gynecology

## 2015-11-19 ENCOUNTER — Ambulatory Visit (INDEPENDENT_AMBULATORY_CARE_PROVIDER_SITE_OTHER): Payer: Self-pay | Admitting: Obstetrics & Gynecology

## 2015-11-19 VITALS — BP 129/65 | HR 75 | Temp 97.9°F | Ht 66.0 in | Wt 252.3 lb

## 2015-11-19 DIAGNOSIS — N920 Excessive and frequent menstruation with regular cycle: Secondary | ICD-10-CM

## 2015-11-19 MED ORDER — NORGESTIMATE-ETH ESTRADIOL 0.25-35 MG-MCG PO TABS: ORAL_TABLET | ORAL | Status: DC

## 2015-11-19 NOTE — Patient Instructions (Signed)
Oral Contraception Use Oral contraceptive pills (OCPs) are medicines taken to prevent pregnancy. OCPs work by preventing the ovaries from releasing eggs. The hormones in OCPs also cause the cervical mucus to thicken, preventing the sperm from entering the uterus. The hormones also cause the uterine lining to become thin, not allowing a fertilized egg to attach to the inside of the uterus. OCPs are highly effective when taken exactly as prescribed. However, OCPs do not prevent sexually transmitted diseases (STDs). Safe sex practices, such as using condoms along with an OCP, can help prevent STDs. Before taking OCPs, you may have a physical exam and Pap test. Your health care provider may also order blood tests if necessary. Your health care provider will make sure you are a good candidate for oral contraception. Discuss with your health care provider the possible side effects of the OCP you may be prescribed. When starting an OCP, it can take 2 to 3 months for the body to adjust to the changes in hormone levels in your body.  HOW TO TAKE ORAL CONTRACEPTIVE PILLS Your health care provider may advise you on how to start taking the first cycle of OCPs. Otherwise, you can:   Start on day 1 of your menstrual period. You will not need any backup contraceptive protection with this start time.   Start on the first Sunday after your menstrual period or the day you get your prescription. In these cases, you will need to use backup contraceptive protection for the first week.   Start the pill at any time of your cycle. If you take the pill within 5 days of the start of your period, you are protected against pregnancy right away. In this case, you will not need a backup form of birth control. If you start at any other time of your menstrual cycle, you will need to use another form of birth control for 7 days. If your OCP is the type called a minipill, it will protect you from pregnancy after taking it for 2 days (48  hours). After you have started taking OCPs:   If you forget to take 1 pill, take it as soon as you remember. Take the next pill at the regular time.   If you miss 2 or more pills, call your health care provider because different pills have different instructions for missed doses. Use backup birth control until your next menstrual period starts.   If you use a 28-day pack that contains inactive pills and you miss 1 of the last 7 pills (pills with no hormones), it will not matter. Throw away the rest of the non-hormone pills and start a new pill pack.  No matter which day you start the OCP, you will always start a new pack on that same day of the week. Have an extra pack of OCPs and a backup contraceptive method available in case you miss some pills or lose your OCP pack.  HOME CARE INSTRUCTIONS   Do not smoke.   Always use a condom to protect against STDs. OCPs do not protect against STDs.   Use a calendar to mark your menstrual period days.   Read the information and directions that came with your OCP. Talk to your health care provider if you have questions.  SEEK MEDICAL CARE IF:   You develop nausea and vomiting.   You have abnormal vaginal discharge or bleeding.   You develop a rash.   You miss your menstrual period.   You are losing   your hair.   You need treatment for mood swings or depression.   You get dizzy when taking the OCP.   You develop acne from taking the OCP.   You become pregnant.  SEEK IMMEDIATE MEDICAL CARE IF:   You develop chest pain.   You develop shortness of breath.   You have an uncontrolled or severe headache.   You develop numbness or slurred speech.   You develop visual problems.   You develop pain, redness, and swelling in the legs.    This information is not intended to replace advice given to you by your health care provider. Make sure you discuss any questions you have with your health care provider.   Document  Released: 10/19/2011 Document Revised: 11/20/2014 Document Reviewed: 04/20/2013 Elsevier Interactive Patient Education 2016 Elsevier Inc.  

## 2015-11-19 NOTE — Progress Notes (Signed)
Patient ID: Gwendolyn Russell, female   DOB: 04/10/1966, 50 y.o.   MRN: [id number]  Chief Complaint  Patient presents with  . Follow-up  DUB on contraceptive patch  HPI Gwendolyn Russell is a 50 y.o. female.  [id number] Patient's last menstrual period was 08/25/2015. continuous vaginal bleeding using the Xulane patch. She does not want to try to stop all hormones HPI  Past Medical History  Diagnosis Date  . Hypertension   . Anxiety   . Insomnia   . GERD (gastroesophageal reflux disease)   . Abnormal Pap smear   . DVT of leg (deep venous thrombosis), left   . Obesity     Past Surgical History  Procedure Laterality Date  . Tonsillectomy    . Carpal tunnel release    . Bunionectomy    . Laser ablation condyloma cervical / vulvar    . Tubal ligation    . Leep    . Colposcopy    . 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 Heart disease Maternal Grandmother   . Diabetes Father   . Heart disease Father   . Deep vein thrombosis Father   . Early death Mother   . Breast cancer Sister   . Diabetes Sister     Social History Social History  Substance Use Topics  . Smoking status: Never Smoker   . Smokeless tobacco: Never Used  . Alcohol Use: No    Allergies  Allergen Reactions  . Cephalosporins Hives  . Sulfa Antibiotics   . Tdap [Diphth-Acell Pertussis-Tetanus]     Current Outpatient Prescriptions  Medication Sig Dispense Refill  . albuterol (PROVENTIL HFA;VENTOLIN HFA) 108 (90 BASE) MCG/ACT inhaler Inhale 1-2 puffs into the lungs every 6 (six) hours as needed for wheezing or shortness of breath.    . ALPRAZolam (XANAX) 1 MG tablet Take 1 mg by mouth 3 (three) times daily.    . Biotin 5000 MCG CAPS Take 5 mg by mouth.    . bismuth subsalicylate (PEPTO BISMOL) 262 MG/15ML suspension Take 30 mLs by mouth.    . calcium carbonate (TUMS - DOSED IN MG ELEMENTAL CALCIUM) 500 MG chewable tablet Chew 2 tablets by mouth.     . Cholecalciferol (VITAMIN D3) 2000 units TABS Take 2 tablets by mouth.    . cyclobenzaprine (FLEXERIL) 5 MG tablet     . cyproheptadine (PERIACTIN) 4 MG tablet Take 8 mg by mouth at bedtime.    . diclofenac sodium (VOLTAREN) 1 % GEL 4 (four) times a day as needed.     . diphenhydrAMINE (BENADRYL) 25 MG tablet Take 25 mg by mouth.    Marland Kitchen FLUoxetine (PROZAC) 40 MG capsule Take 40 mg by mouth daily.    Marland Kitchen gabapentin (NEURONTIN) 300 MG capsule Take 300 mg by mouth 3 (three) times daily.    . metoCLOPramide (REGLAN) 10 MG tablet     . mirtazapine (REMERON) 15 MG tablet Take 15 mg by mouth at bedtime.    Marland Kitchen nystatin (MYCOSTATIN) powder     . omeprazole (PRILOSEC) 40 MG capsule Take 40 mg by mouth daily.    . ondansetron (ZOFRAN) 4 MG tablet Take 4 mg by mouth.    . prazosin (MINIPRESS) 2 MG capsule Take 2 mg by mouth at bedtime.    . Suvorexant (BELSOMRA) 20 MG TABS Take by mouth.    . topiramate (TOPAMAX) 50 MG tablet Take 50 mg by mouth daily.    Marland Kitchen  triamcinolone cream (KENALOG) 0.1 %     . ursodiol (ACTIGALL) 300 MG capsule Take one tablet twice a day with food beginning 14 days after surgery.    . vitamin B-12 (CYANOCOBALAMIN) 1000 MCG tablet Take 1,000 mcg by mouth.    Gwendolyn Russell 150-35 MCG/24HR transdermal patch APPLY ONE PATCH TOPICALLY ONCE A WEEK. DO NOT WITHDRAW AFTER 3 WEEKS * REMOVE OLD PATCH* 3 patch 0  . zolpidem (AMBIEN) 5 MG tablet Take 10 mg by mouth at bedtime as needed.     . norgestimate-ethinyl estradiol (ORTHO-CYCLEN,SPRINTEC,PREVIFEM) 0.25-35 MG-MCG tablet 2 by mouth daily until no bleeding then one a day 1 Package 11  . traMADol (ULTRAM) 50 MG tablet Reported on 11/19/2015     No current facility-administered medications for this visit.    Review of Systems Review of Systems  Constitutional: Negative.   Gastrointestinal: Negative.   Genitourinary: Positive for vaginal bleeding and menstrual problem. Negative for vaginal discharge and pelvic pain.    Blood pressure  129/65, pulse 75, temperature 97.9 F (36.6 C), height 5\' 6"  (1.676 m), weight 252 lb 4.8 oz (114.443 kg), last menstrual period 08/25/2015.  Physical Exam Physical Exam  Constitutional: She is oriented to person, place, and time. She appears well-developed. No distress.  Pulmonary/Chest: Effort normal.  Neurological: She is alert and oriented to person, place, and time.  Skin: Skin is warm and dry. No pallor.  Psychiatric: She has a normal mood and affect. Her behavior is normal.    Data Reviewed   Assessment    BTB on Xulane Wants to continue hormone therapy     Plan    Ortho Cyclen 2/day until no bleeding then 1 /day RTC 3 months        Laymond Postle 11/19/2015, 12:16 PM

## 2015-12-07 ENCOUNTER — Telehealth: Payer: Self-pay | Admitting: *Deleted

## 2015-12-07 DIAGNOSIS — N938 Other specified abnormal uterine and vaginal bleeding: Secondary | ICD-10-CM

## 2015-12-07 NOTE — Telephone Encounter (Signed)
Gwendolyn Russell left a message on voicemail today stating she is a patient of Dr. Roselie Awkward and is still having problems with OCP's. States she is taking like he prescribed 2 per day. Everytime she tries to go back to one pill per day her period comes back in a day or two, and cramping real bad. States she is on her 2nd pack of pills and wants to know what to do before she gets her 3rd pack- do we need to change it?

## 2015-12-07 NOTE — Telephone Encounter (Signed)
Called Gwendolyn Russell and notified her we received her message and that Dr. Roselie Awkward is not in clinic today, but I will send a message to him and as soon as he lets [place] know what he would like to do, we will call her back.  She voices understanding.

## 2015-12-09 MED ORDER — NORETHINDRONE-MESTRANOL 1-50 MG-MCG PO TABS: 1.0000 | ORAL_TABLET | Freq: Every day | ORAL | Status: DC

## 2015-12-09 NOTE — Telephone Encounter (Signed)
Discussed patient concerns with Dr. Roselie Awkward, he reviewed chart and ordered a new prescription of necon to start when she finishes second pack of sprintec. If she is still having problems on necon , she should call to make appointment to be seen.  Called [facility] and left a message we have sent you in a new prescription to start when you finish 2nd pack of previous prescription. Call us if you have any questions or concerns.

## 2015-12-27 ENCOUNTER — Telehealth: Payer: Self-pay

## 2015-12-27 NOTE — Telephone Encounter (Signed)
Patient left a message on nurse line stating she is still having problems with her cycle. Per Dr.Arnold, note on 12/14/2015 if patient continues to have problems she will need an appt scheduled. I have left a message for patient to call us back to scheduled an appt.

## 2015-12-29 DIAGNOSIS — I10 Essential (primary) hypertension: Secondary | ICD-10-CM | POA: Diagnosis present

## 2015-12-29 DIAGNOSIS — K219 Gastro-esophageal reflux disease without esophagitis: Secondary | ICD-10-CM | POA: Diagnosis present

## 2015-12-31 NOTE — Telephone Encounter (Signed)
Pt has not returned a call to clinics regarding her menses

## 2016-01-12 ENCOUNTER — Encounter: Payer: Self-pay | Admitting: Obstetrics & Gynecology

## 2016-01-12 ENCOUNTER — Ambulatory Visit (INDEPENDENT_AMBULATORY_CARE_PROVIDER_SITE_OTHER): Payer: BLUE CROSS/BLUE SHIELD | Admitting: Obstetrics & Gynecology

## 2016-01-12 VITALS — BP 129/52 | HR 76 | Temp 98.8°F | Wt 243.4 lb

## 2016-01-12 DIAGNOSIS — N920 Excessive and frequent menstruation with regular cycle: Secondary | ICD-10-CM

## 2016-01-12 NOTE — Patient Instructions (Signed)
Hysterectomy Information   A hysterectomy is a surgery in which your uterus is removed. This surgery may be done to treat various medical problems. After the surgery, you will no longer have menstrual periods. The surgery will also make you unable to become pregnant (sterile). The fallopian tubes and ovaries can be removed (bilateral salpingo-oophorectomy) during this surgery as well.   REASONS FOR A HYSTERECTOMY  · Persistent, abnormal bleeding.  · Lasting (chronic) pelvic pain or infection.  · The lining of the uterus (endometrium) starts growing outside the uterus (endometriosis).  · The endometrium starts growing in the muscle of the uterus (adenomyosis).  · The uterus falls down into the vagina (pelvic organ prolapse).  · Noncancerous growths in the uterus (uterine fibroids) that cause symptoms.  · Precancerous cells.  · Cervical cancer or uterine cancer.  TYPES OF HYSTERECTOMIES  · Supracervical hysterectomy--In this type, the top part of the uterus is removed, but not the cervix.  · Total hysterectomy--The uterus and cervix are removed.  · Radical hysterectomy--The uterus, the cervix, and the fibrous tissue that holds the uterus in place in the pelvis (parametrium) are removed.  WAYS A HYSTERECTOMY CAN BE PERFORMED  · Abdominal hysterectomy--A large surgical cut (incision) is made in the abdomen. The uterus is removed through this incision.  · Vaginal hysterectomy--An incision is made in the vagina. The uterus is removed through this incision. There are no abdominal incisions.  · Conventional laparoscopic hysterectomy--Three or four small incisions are made in the abdomen. A thin, lighted tube with a camera (laparoscope) is inserted into one of the incisions. Other tools are put through the other incisions. The uterus is cut into small pieces. The small pieces are removed through the incisions, or they are removed through the vagina.  · Laparoscopically assisted vaginal hysterectomy (LAVH)--Three or four  small incisions are made in the abdomen. Part of the surgery is performed laparoscopically and part vaginally. The uterus is removed through the vagina.  · Robot-assisted laparoscopic hysterectomy--A laparoscope and other tools are inserted into 3 or 4 small incisions in the abdomen. A computer-controlled device is used to give the surgeon a 3D image and to help control the surgical instruments. This allows for more precise movements of surgical instruments. The uterus is cut into small pieces and removed through the incisions or removed through the vagina.  RISKS AND COMPLICATIONS   Possible complications associated with this procedure include:  · Bleeding and risk of blood transfusion. Tell your health care provider if you do not want to receive any blood products.  · Blood clots in the legs or lung.  · Infection.  · Injury to surrounding organs.  · Problems or side effects related to anesthesia.  · Conversion to an abdominal hysterectomy from one of the other techniques.  WHAT TO EXPECT AFTER A HYSTERECTOMY  · You will be given pain medicine.  · You will need to have someone with you for the first 3-5 days after you go home.  · You will need to follow up with your surgeon in 2-4 weeks after surgery to evaluate your progress.  · You may have early menopause symptoms such as hot flashes, night sweats, and insomnia.  · If you had a hysterectomy for a problem that was not cancer or not a condition that could lead to cancer, then you no longer need Pap tests. However, even if you no longer need a Pap test, a regular exam is a good idea to make sure no   other problems are starting.     This information is not intended to replace advice given to you by your health care provider. Make sure you discuss any questions you have with your health care provider.     Document Released: 04/25/2001 Document Revised: 08/20/2013 Document Reviewed: 07/07/2013  Elsevier Interactive Patient Education ©2016 [employer] Inc.

## 2016-01-12 NOTE — Progress Notes (Signed)
Patient ID: [name], female   DOB: [birth year], 50 y.o.   MRN: [id number]  Chief Complaint  Patient presents with  . concerns with birth control/cycle.    HPI Mima Viona Gilmore Hugo is a 50 y.o. female.  [id number] Patient's last menstrual period was 08/25/2015. Continuous BTB on OCP, considering hysterectomy  HPI  Past Medical History  Diagnosis Date  . Hypertension   . Anxiety   . Insomnia   . GERD (gastroesophageal reflux disease)   . Abnormal Pap smear   . DVT of leg (deep venous thrombosis), left   . Obesity     Past Surgical History  Procedure Laterality Date  . Tonsillectomy    . Carpal tunnel release    . Bunionectomy    . Laser ablation condyloma cervical / vulvar    . Tubal ligation    . Leep    . Colposcopy    . 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 Heart disease Maternal Grandmother   . Diabetes Father   . Heart disease Father   . Deep vein thrombosis Father   . Early death Mother   . Breast cancer Sister   . Diabetes Sister     Social History Social History  Substance Use Topics  . Smoking status: Never Smoker   . Smokeless tobacco: Never Used  . Alcohol Use: No    Allergies  Allergen Reactions  . Cephalosporins Hives  . Clindamycin Itching  . Sulfa Antibiotics   . Tdap [Diphth-Acell Pertussis-Tetanus]   . Tape Rash    Current Outpatient Prescriptions  Medication Sig Dispense Refill  . acetaminophen (TYLENOL) 500 MG tablet Take by mouth.    . ALPRAZolam (XANAX) 1 MG tablet Take 1 mg by mouth 3 (three) times daily.    . Biotin 5000 MCG CAPS Take 1 capsule by mouth daily.     . Cholecalciferol (VITAMIN D3) 2000 units TABS Take 2 tablets by mouth daily.     . cyclobenzaprine (FLEXERIL) 5 MG tablet Take 5 mg by mouth at bedtime. Takes 10mg  nightly    . diclofenac sodium (VOLTAREN) 1 % GEL 4 (four) times a day as needed.     . diphenhydrAMINE (BENADRYL) 25 MG tablet Take 25 mg  by mouth.    Marland Kitchen FLUoxetine (PROZAC) 40 MG capsule Take 40 mg by mouth daily.    Marland Kitchen gabapentin (NEURONTIN) 300 MG capsule Take 300 mg by mouth 3 (three) times daily.    . metoCLOPramide (REGLAN) 10 MG tablet     . mirtazapine (REMERON) 15 MG tablet Take 15 mg by mouth at bedtime.    . Multiple Vitamins-Minerals (MULTIVITAMIN WITH MINERALS) tablet Take 1 tablet by mouth daily.    . Norethindrone-Mestranol (NECON) 1-50 MG-MCG tablet Take 1 tablet by mouth daily. 1 Package 11  . nystatin (MYCOSTATIN) powder     . omeprazole (PRILOSEC) 40 MG capsule Take 40 mg by mouth daily.    . ondansetron (ZOFRAN) 4 MG tablet Take 4 mg by mouth.    . prazosin (MINIPRESS) 2 MG capsule Take 2 mg by mouth at bedtime.    . Suvorexant (BELSOMRA) 20 MG TABS Take by mouth.    . topiramate (TOPAMAX) 50 MG tablet Take 50 mg by mouth daily.    Marland Kitchen triamcinolone cream (KENALOG) 0.1 %     . ursodiol (ACTIGALL) 300 MG capsule Take one tablet twice a day with food beginning 14 days  after surgery.    . vitamin B-12 (CYANOCOBALAMIN) 1000 MCG tablet Take 1,000 mcg by mouth.    . zolpidem (AMBIEN) 5 MG tablet Take 10 mg by mouth at bedtime as needed.      No current facility-administered medications for this visit.    Review of Systems Review of Systems  Constitutional: Negative.   Gastrointestinal: Negative.   Genitourinary: Positive for vaginal bleeding, menstrual problem and pelvic pain. Negative for vaginal discharge.    Blood pressure 129/52, pulse 76, temperature 98.8 F (37.1 C), weight 243 lb 6.4 oz (110.406 kg), last menstrual period 08/25/2015.  Physical Exam Physical Exam  Constitutional: She is oriented to person, place, and time. She appears well-developed. No distress.  Pulmonary/Chest: Effort normal.  Neurological: She is alert and oriented to person, place, and time.  Skin: No pallor.  Psychiatric: She has a normal mood and affect. Her behavior is normal.    Data Reviewed Op note ablation, pap and  [place] result and endometrial biopsy  Assessment    DUB on OCP, dysmenorrhea H/o ablation      Plan    Pelvic US. She requests no endometrial biopsy due to discomfort Request to schedule TVH before 4/1 for insurance.Information given on procedure       ARNOLD,JAMES 01/12/2016, 4:20 PM

## 2016-01-17 ENCOUNTER — Encounter (HOSPITAL_COMMUNITY): Payer: Self-pay | Admitting: *Deleted

## 2016-01-18 ENCOUNTER — Ambulatory Visit (HOSPITAL_COMMUNITY)
Admission: RE | Admit: 2016-01-18 | Discharge: 2016-01-18 | Disposition: A | Discharge status: Home / Self Care | Payer: BLUE CROSS/BLUE SHIELD | Source: Ambulatory Visit | Attending: Obstetrics & Gynecology | Admitting: Obstetrics & Gynecology

## 2016-01-18 DIAGNOSIS — D251 Intramural leiomyoma of uterus: Secondary | ICD-10-CM | POA: Insufficient documentation

## 2016-01-18 DIAGNOSIS — N800 Endometriosis of uterus: Secondary | ICD-10-CM | POA: Insufficient documentation

## 2016-01-18 DIAGNOSIS — N920 Excessive and frequent menstruation with regular cycle: Secondary | ICD-10-CM

## 2016-01-18 DIAGNOSIS — N938 Other specified abnormal uterine and vaginal bleeding: Secondary | ICD-10-CM | POA: Diagnosis not present

## 2016-01-19 ENCOUNTER — Telehealth: Payer: Self-pay | Admitting: *Deleted

## 2016-01-19 NOTE — Telephone Encounter (Signed)
Gwendolyn Russell called and left a message yesterday afternoon that she is a patient of Dr. Roselie Awkward and she wanted to let him know she had the ultrasound done and she is trying to have her hysterectomy done before 02/11/16 as she discussed with him previously. States this is because she has paid her copays/ out of pocket needed for insurance.  Do see surgery scheduled for April.  Called OB/GYN office and left a message and am forwarding this message to Marni/[place] and Dr. Roselie Awkward.

## 2016-01-24 ENCOUNTER — Ambulatory Visit (HOSPITAL_COMMUNITY): Payer: BLUE CROSS/BLUE SHIELD

## 2016-01-26 ENCOUNTER — Encounter (HOSPITAL_COMMUNITY): Payer: Self-pay | Admitting: *Deleted

## 2016-01-27 ENCOUNTER — Other Ambulatory Visit: Payer: Self-pay | Admitting: Obstetrics & Gynecology

## 2016-01-27 ENCOUNTER — Inpatient Hospital Stay (HOSPITAL_COMMUNITY)
Admission: RE | Admit: 2016-01-27 | Discharge: 2016-01-27 | Disposition: A | Discharge status: Home / Self Care | Payer: BLUE CROSS/BLUE SHIELD | Source: Ambulatory Visit

## 2016-01-27 NOTE — Patient Instructions (Addendum)
Your procedure is scheduled on:  Tuesday, February 08, 2016  Enter through the Main Entrance of [facility] at:  1:00 PM  Pick up the phone at the desk and dial [phone].  Call this number if you have problems the morning of surgery: [phone].  Remember: Do NOT eat food:  After Midnight Monday  Do NOT drink clear liquids after:  10:30 AM day of surgery  Take these medicines the morning of surgery with a SIP OF WATER: Prazosin, Nortriptyline, Dexilant, Gabapentin, Topiramate, Prozac, Xanax   Do NOT wear jewelry (body piercing), metal hair clips/bobby pins, make-up, or nail polish. Do NOT wear lotions, powders, or perfumes.  You may wear deoderant. Do NOT shave for 48 hours prior to surgery. Do NOT bring valuables to the hospital. Contacts, dentures, or bridgework may not be worn into surgery.  Leave suitcase in car.  After surgery it may be brought to your room.  For patients admitted to the hospital, checkout time is 11:00 AM the day of discharge.

## 2016-01-28 ENCOUNTER — Encounter (HOSPITAL_COMMUNITY): Payer: Self-pay

## 2016-01-28 ENCOUNTER — Encounter (HOSPITAL_COMMUNITY)
Admission: RE | Admit: 2016-01-28 | Discharge: 2016-01-28 | Disposition: A | Discharge status: Home / Self Care | Payer: BLUE CROSS/BLUE SHIELD | Source: Ambulatory Visit | Attending: Obstetrics & Gynecology | Admitting: Obstetrics & Gynecology

## 2016-01-28 DIAGNOSIS — Z01812 Encounter for preprocedural laboratory examination: Secondary | ICD-10-CM | POA: Insufficient documentation

## 2016-01-28 HISTORY — DX: Presence of dental prosthetic device (complete) (partial): Z97.2

## 2016-01-28 HISTORY — DX: Anemia, unspecified: D64.9

## 2016-01-28 HISTORY — DX: Unspecified osteoarthritis, unspecified site: M19.90

## 2016-01-28 HISTORY — DX: Nausea with vomiting, unspecified: R11.2

## 2016-01-28 HISTORY — DX: Other chronic pain: G89.29

## 2016-01-28 HISTORY — DX: Dorsalgia, unspecified: M54.9

## 2016-01-28 HISTORY — DX: Depression, unspecified: F32.A

## 2016-01-28 HISTORY — DX: Headache: R51

## 2016-01-28 HISTORY — DX: Other specified postprocedural states: Z98.890

## 2016-01-28 HISTORY — DX: Major depressive disorder, single episode, unspecified: F32.9

## 2016-01-28 HISTORY — DX: Headache, unspecified: R51.9

## 2016-01-28 LAB — TYPE AND SCREEN
ABO/RH(D): B POS
Antibody Screen: NEGATIVE

## 2016-01-28 LAB — BASIC METABOLIC PANEL
Anion gap: 6 (ref 5–15)
BUN: 25 mg/dL — AB (ref 6–20)
CHLORIDE: 108 mmol/L (ref 101–111)
CO2: 25 mmol/L (ref 22–32)
CREATININE: 1.03 mg/dL — AB (ref 0.44–1.00)
Calcium: 9.2 mg/dL (ref 8.9–10.3)
GFR calc Af Amer: >60 mL/min (ref >60)
GFR calc non Af Amer: >60 mL/min (ref >60)
GLUCOSE: 97 mg/dL (ref 65–99)
Potassium: 4.1 mmol/L (ref 3.5–5.1)
SODIUM: 139 mmol/L (ref 135–145)

## 2016-01-28 LAB — CBC
HEMATOCRIT: 37 % (ref 36.0–46.0)
HEMOGLOBIN: 11.5 g/dL — AB (ref 12.0–15.0)
MCH: 26.7 pg (ref 26.0–34.0)
MCHC: 31.1 g/dL (ref 30.0–36.0)
MCV: 86 fL (ref 78.0–100.0)
PLATELETS: 292 10*3/uL (ref 150–400)
RBC: 4.3 MIL/uL (ref 3.87–5.11)
RDW: 14.2 % (ref 11.5–15.5)
WBC: 5.5 10*3/uL (ref 4.0–10.5)

## 2016-01-28 LAB — ABO/RH: ABO/RH(D): B POS

## 2016-01-28 NOTE — Patient Instructions (Addendum)
Your procedure is scheduled on: Tuesday, March 28  Enter through the Main Entrance of [facility] at: [place] up the phone at the desk and dial [phone].  Call this number if you have problems the morning of surgery: [phone].  Remember: Do NOT eat food:after midnight Monday, 3/27 Do NOT drink clear liquids: after 10am Tuesday, day of surgery Take these medicines the morning of surgery with a SIP OF WATER:  Prozac, gabapentin, topomax and zanax if needed.  Do NOT wear jewelry (body piercing), metal hair clips/bobby pins, make-up, or nail polish. Do NOT wear lotions, powders, or perfumes.  You may wear deoderant. Do NOT shave for 48 hours prior to surgery. Do NOT bring valuables to the hospital. Contacts, dentures, or bridgework may not be worn into surgery.  Leave suitcase in car.  After surgery it may be brought to your room.  For patients admitted to the hospital, checkout time is 11:00 AM the day of discharge.  Home with husband Monica Martinez cell [phone]

## 2016-02-05 ENCOUNTER — Other Ambulatory Visit: Payer: Self-pay | Admitting: Obstetrics & Gynecology

## 2016-02-07 MED ORDER — METRONIDAZOLE IN NACL 5-0.79 MG/ML-% IV SOLN
500.0000 mg | INTRAVENOUS | Status: AC
  Administered 2016-02-08: 500 mg via INTRAVENOUS
  Filled 2016-02-07: qty 100

## 2016-02-07 MED ORDER — CIPROFLOXACIN IN D5W 400 MG/200ML IV SOLN
400.0000 mg | INTRAVENOUS | Status: AC
  Administered 2016-02-08: 400 mg via INTRAVENOUS
  Filled 2016-02-07: qty 200

## 2016-02-08 ENCOUNTER — Ambulatory Visit (HOSPITAL_COMMUNITY)
Admission: RE | Admit: 2016-02-08 | Discharge: 2016-02-09 | Disposition: A | Discharge status: Home / Self Care | Payer: BLUE CROSS/BLUE SHIELD | Source: Ambulatory Visit | Attending: Obstetrics & Gynecology | Admitting: Obstetrics & Gynecology

## 2016-02-08 ENCOUNTER — Encounter (HOSPITAL_COMMUNITY)
Admission: RE | Disposition: A | Discharge status: Home / Self Care | Payer: Self-pay | Source: Ambulatory Visit | Attending: Obstetrics & Gynecology

## 2016-02-08 ENCOUNTER — Encounter (HOSPITAL_COMMUNITY): Payer: Self-pay | Admitting: *Deleted

## 2016-02-08 ENCOUNTER — Ambulatory Visit (HOSPITAL_COMMUNITY): Payer: BLUE CROSS/BLUE SHIELD | Admitting: Registered Nurse

## 2016-02-08 DIAGNOSIS — N800 Endometriosis of uterus: Secondary | ICD-10-CM | POA: Insufficient documentation

## 2016-02-08 DIAGNOSIS — I10 Essential (primary) hypertension: Secondary | ICD-10-CM

## 2016-02-08 DIAGNOSIS — N840 Polyp of corpus uteri: Secondary | ICD-10-CM | POA: Diagnosis not present

## 2016-02-08 DIAGNOSIS — K219 Gastro-esophageal reflux disease without esophagitis: Secondary | ICD-10-CM | POA: Diagnosis not present

## 2016-02-08 DIAGNOSIS — D251 Intramural leiomyoma of uterus: Secondary | ICD-10-CM | POA: Insufficient documentation

## 2016-02-08 DIAGNOSIS — F419 Anxiety disorder, unspecified: Secondary | ICD-10-CM | POA: Insufficient documentation

## 2016-02-08 DIAGNOSIS — Z6839 Body mass index (BMI) 39.0-39.9, adult: Secondary | ICD-10-CM | POA: Insufficient documentation

## 2016-02-08 DIAGNOSIS — I739 Peripheral vascular disease, unspecified: Secondary | ICD-10-CM | POA: Insufficient documentation

## 2016-02-08 DIAGNOSIS — N938 Other specified abnormal uterine and vaginal bleeding: Secondary | ICD-10-CM | POA: Insufficient documentation

## 2016-02-08 DIAGNOSIS — N946 Dysmenorrhea, unspecified: Secondary | ICD-10-CM | POA: Insufficient documentation

## 2016-02-08 DIAGNOSIS — Z86718 Personal history of other venous thrombosis and embolism: Secondary | ICD-10-CM | POA: Diagnosis not present

## 2016-02-08 DIAGNOSIS — N920 Excessive and frequent menstruation with regular cycle: Secondary | ICD-10-CM | POA: Diagnosis present

## 2016-02-08 HISTORY — PX: VAGINAL HYSTERECTOMY: SHX2639

## 2016-02-08 SURGERY — HYSTERECTOMY, VAGINAL
Anesthesia: General | Site: Vagina

## 2016-02-08 MED ORDER — ONDANSETRON HCL 4 MG/2ML IJ SOLN
INTRAMUSCULAR | Status: AC
  Filled 2016-02-08: qty 2

## 2016-02-08 MED ORDER — LACTATED RINGERS IV SOLN: INTRAVENOUS | Status: DC

## 2016-02-08 MED ORDER — LACTATED RINGERS IV SOLN
INTRAVENOUS | Status: DC
  Administered 2016-02-08: 15:00:00 via INTRAVENOUS
  Administered 2016-02-08: 125 mL/h via INTRAVENOUS
  Administered 2016-02-08: 14:00:00 via INTRAVENOUS

## 2016-02-08 MED ORDER — KETOROLAC TROMETHAMINE 30 MG/ML IJ SOLN
INTRAMUSCULAR | Status: AC
  Filled 2016-02-08: qty 1

## 2016-02-08 MED ORDER — SCOPOLAMINE 1 MG/3DAYS TD PT72
MEDICATED_PATCH | TRANSDERMAL | Status: AC
  Administered 2016-02-08: 1.5 mg via TRANSDERMAL
  Filled 2016-02-08: qty 1

## 2016-02-08 MED ORDER — ONDANSETRON HCL 4 MG PO TABS
4.0000 mg | ORAL_TABLET | Freq: Four times a day (QID) | ORAL | Status: DC | PRN
Start: 2016-02-08 — End: 2016-02-09

## 2016-02-08 MED ORDER — DEXAMETHASONE SODIUM PHOSPHATE 4 MG/ML IJ SOLN
INTRAMUSCULAR | Status: DC | PRN
  Administered 2016-02-08: 10 mg via INTRAVENOUS

## 2016-02-08 MED ORDER — LACTATED RINGERS IV SOLN
INTRAVENOUS | Status: DC
  Administered 2016-02-08 – 2016-02-09 (×2): via INTRAVENOUS

## 2016-02-08 MED ORDER — ONDANSETRON HCL 4 MG/2ML IJ SOLN: 4.0000 mg | Freq: Once | INTRAMUSCULAR | Status: DC | PRN

## 2016-02-08 MED ORDER — TOPIRAMATE 25 MG PO TABS
50.0000 mg | ORAL_TABLET | Freq: Three times a day (TID) | ORAL | Status: DC
  Filled 2016-02-08 (×5): qty 2

## 2016-02-08 MED ORDER — FENTANYL CITRATE (PF) 100 MCG/2ML IJ SOLN
INTRAMUSCULAR | Status: DC | PRN
  Administered 2016-02-08: 50 ug via INTRAVENOUS
  Administered 2016-02-08: 100 ug via INTRAVENOUS
  Administered 2016-02-08 (×2): 50 ug via INTRAVENOUS

## 2016-02-08 MED ORDER — NALOXONE HCL 0.4 MG/ML IJ SOLN
INTRAMUSCULAR | Status: AC
  Filled 2016-02-08: qty 1

## 2016-02-08 MED ORDER — LIDOCAINE-EPINEPHRINE (PF) 1 %-1:200000 IJ SOLN
INTRAMUSCULAR | Status: AC
  Filled 2016-02-08: qty 30

## 2016-02-08 MED ORDER — GLYCOPYRROLATE 0.2 MG/ML IJ SOLN
INTRAMUSCULAR | Status: DC | PRN
  Administered 2016-02-08: .8 mg via INTRAVENOUS

## 2016-02-08 MED ORDER — FLUOXETINE HCL 20 MG PO CAPS
40.0000 mg | ORAL_CAPSULE | Freq: Every day | ORAL | Status: DC
  Administered 2016-02-08 – 2016-02-09 (×2): 40 mg via ORAL
  Filled 2016-02-08 (×3): qty 2

## 2016-02-08 MED ORDER — PRAZOSIN HCL 2 MG PO CAPS
2.0000 mg | ORAL_CAPSULE | Freq: Every day | ORAL | Status: DC
  Administered 2016-02-08: 2 mg via ORAL
  Filled 2016-02-08 (×2): qty 1

## 2016-02-08 MED ORDER — ONDANSETRON HCL 4 MG/2ML IJ SOLN
INTRAMUSCULAR | Status: DC | PRN
  Administered 2016-02-08: 4 mg via INTRAVENOUS

## 2016-02-08 MED ORDER — FLUOXETINE HCL 20 MG PO CAPS
40.0000 mg | ORAL_CAPSULE | Freq: Every day | ORAL | Status: DC
  Filled 2016-02-08 (×2): qty 2

## 2016-02-08 MED ORDER — ROCURONIUM BROMIDE 100 MG/10ML IV SOLN
INTRAVENOUS | Status: DC | PRN
  Administered 2016-02-08: 60 mg via INTRAVENOUS

## 2016-02-08 MED ORDER — MIDAZOLAM HCL 5 MG/5ML IJ SOLN
INTRAMUSCULAR | Status: DC | PRN
  Administered 2016-02-08: 2 mg via INTRAVENOUS

## 2016-02-08 MED ORDER — ZOLPIDEM TARTRATE 5 MG PO TABS: 5.0000 mg | ORAL_TABLET | Freq: Every day | ORAL | Status: DC

## 2016-02-08 MED ORDER — LIDOCAINE HCL (CARDIAC) 20 MG/ML IV SOLN
INTRAVENOUS | Status: AC
  Filled 2016-02-08: qty 5

## 2016-02-08 MED ORDER — NORTRIPTYLINE HCL 25 MG PO CAPS
25.0000 mg | ORAL_CAPSULE | Freq: Every day | ORAL | Status: DC
  Administered 2016-02-08: 25 mg via ORAL
  Filled 2016-02-08 (×2): qty 1

## 2016-02-08 MED ORDER — MIDAZOLAM HCL 2 MG/2ML IJ SOLN
INTRAMUSCULAR | Status: AC
  Filled 2016-02-08: qty 2

## 2016-02-08 MED ORDER — HYDROMORPHONE HCL 1 MG/ML IJ SOLN
INTRAMUSCULAR | Status: AC
  Filled 2016-02-08: qty 1

## 2016-02-08 MED ORDER — NEOSTIGMINE METHYLSULFATE 10 MG/10ML IV SOLN
INTRAVENOUS | Status: AC
  Filled 2016-02-08: qty 1

## 2016-02-08 MED ORDER — PROPOFOL 10 MG/ML IV BOLUS
INTRAVENOUS | Status: AC
  Filled 2016-02-08: qty 20

## 2016-02-08 MED ORDER — HYDROMORPHONE HCL 1 MG/ML IJ SOLN
0.2500 mg | INTRAMUSCULAR | Status: DC | PRN
  Administered 2016-02-08: 0.5 mg via INTRAVENOUS
  Administered 2016-02-08: 0.25 mg via INTRAVENOUS

## 2016-02-08 MED ORDER — PROPOFOL 10 MG/ML IV BOLUS
INTRAVENOUS | Status: DC | PRN
  Administered 2016-02-08: 200 mg via INTRAVENOUS

## 2016-02-08 MED ORDER — ONDANSETRON HCL 4 MG/2ML IJ SOLN
4.0000 mg | Freq: Four times a day (QID) | INTRAMUSCULAR | Status: DC | PRN
  Administered 2016-02-08: 4 mg via INTRAVENOUS
  Filled 2016-02-08 (×2): qty 2

## 2016-02-08 MED ORDER — DEXAMETHASONE SODIUM PHOSPHATE 10 MG/ML IJ SOLN
INTRAMUSCULAR | Status: AC
  Filled 2016-02-08: qty 1

## 2016-02-08 MED ORDER — NEOSTIGMINE METHYLSULFATE 10 MG/10ML IV SOLN
INTRAVENOUS | Status: DC | PRN
  Administered 2016-02-08: 5 mg via INTRAVENOUS

## 2016-02-08 MED ORDER — SCOPOLAMINE 1 MG/3DAYS TD PT72
1.0000 | MEDICATED_PATCH | Freq: Once | TRANSDERMAL | Status: DC
  Administered 2016-02-08: 1.5 mg via TRANSDERMAL

## 2016-02-08 MED ORDER — GABAPENTIN 300 MG PO CAPS
300.0000 mg | ORAL_CAPSULE | Freq: Three times a day (TID) | ORAL | Status: DC
  Administered 2016-02-08: 300 mg via ORAL
  Filled 2016-02-08 (×5): qty 1

## 2016-02-08 MED ORDER — HYDROMORPHONE HCL 1 MG/ML IJ SOLN
INTRAMUSCULAR | Status: AC
  Administered 2016-02-08: 0.5 mg via INTRAVENOUS
  Filled 2016-02-08: qty 1

## 2016-02-08 MED ORDER — LIDOCAINE HCL (CARDIAC) 20 MG/ML IV SOLN
INTRAVENOUS | Status: DC | PRN
  Administered 2016-02-08: 100 mg via INTRAVENOUS

## 2016-02-08 MED ORDER — HYDROMORPHONE HCL 1 MG/ML IJ SOLN
1.0000 mg | INTRAMUSCULAR | Status: DC | PRN
Start: 2016-02-08 — End: 2016-02-09

## 2016-02-08 MED ORDER — OXYCODONE-ACETAMINOPHEN 5-325 MG PO TABS
1.0000 | ORAL_TABLET | ORAL | Status: DC | PRN
  Administered 2016-02-08: 1 via ORAL
  Filled 2016-02-08: qty 1

## 2016-02-08 MED ORDER — MEPERIDINE HCL 25 MG/ML IJ SOLN: 6.2500 mg | INTRAMUSCULAR | Status: DC | PRN

## 2016-02-08 MED ORDER — DEXAMETHASONE SODIUM PHOSPHATE 4 MG/ML IJ SOLN
INTRAMUSCULAR | Status: AC
  Filled 2016-02-08: qty 1

## 2016-02-08 MED ORDER — KETOROLAC TROMETHAMINE 30 MG/ML IJ SOLN
30.0000 mg | Freq: Four times a day (QID) | INTRAMUSCULAR | Status: DC
  Administered 2016-02-08 – 2016-02-09 (×3): 30 mg via INTRAVENOUS
  Filled 2016-02-08 (×3): qty 1

## 2016-02-08 MED ORDER — LIDOCAINE-EPINEPHRINE (PF) 1 %-1:200000 IJ SOLN
INTRAMUSCULAR | Status: DC | PRN
  Administered 2016-02-08: 30 mL

## 2016-02-08 MED ORDER — TOPIRAMATE 25 MG PO TABS
50.0000 mg | ORAL_TABLET | Freq: Three times a day (TID) | ORAL | Status: DC
  Administered 2016-02-08 – 2016-02-09 (×3): 50 mg via ORAL
  Filled 2016-02-08 (×5): qty 2

## 2016-02-08 MED ORDER — KETOROLAC TROMETHAMINE 30 MG/ML IJ SOLN: 30.0000 mg | Freq: Four times a day (QID) | INTRAMUSCULAR | Status: DC

## 2016-02-08 MED ORDER — GLYCOPYRROLATE 0.2 MG/ML IJ SOLN
INTRAMUSCULAR | Status: AC
  Filled 2016-02-08: qty 4

## 2016-02-08 MED ORDER — FENTANYL CITRATE (PF) 250 MCG/5ML IJ SOLN
INTRAMUSCULAR | Status: AC
  Filled 2016-02-08: qty 5

## 2016-02-08 MED ORDER — KETOROLAC TROMETHAMINE 30 MG/ML IJ SOLN: 30.0000 mg | Freq: Once | INTRAMUSCULAR | Status: DC

## 2016-02-08 SURGICAL SUPPLY — 23 items
CLOTH BEACON ORANGE TIMEOUT ST (SAFETY) ×3 IMPLANT
DRSG OPSITE POSTOP 4X10 (GAUZE/BANDAGES/DRESSINGS) ×2 IMPLANT
ELECT LIGASURE LONG (ELECTRODE) ×2 IMPLANT
ELECT LIGASURE SHORT 9 REUSE (ELECTRODE) IMPLANT
GLOVE BIO SURGEON STRL SZ 6.5 (GLOVE) ×2 IMPLANT
GLOVE BIO SURGEONS STRL SZ 6.5 (GLOVE) ×1
GLOVE BIOGEL PI IND STRL 7.0 (GLOVE) ×2 IMPLANT
GLOVE BIOGEL PI INDICATOR 7.0 (GLOVE) ×4
GLOVE ECLIPSE 6.0 STRL STRAW (GLOVE) ×3 IMPLANT
GOWN STRL REUS W/TWL LRG LVL3 (GOWN DISPOSABLE) ×12 IMPLANT
NS IRRIG 1000ML POUR BTL (IV SOLUTION) ×3 IMPLANT
PACK VAGINAL WOMENS (CUSTOM PROCEDURE TRAY) ×3 IMPLANT
PAD OB MATERNITY 4.3X12.25 (PERSONAL CARE ITEMS) ×3 IMPLANT
SHEARS FOC LG CVD HARMONIC 17C (MISCELLANEOUS) IMPLANT
SUT VIC AB 0 CT1 18XCR BRD8 (SUTURE) ×2 IMPLANT
SUT VIC AB 0 CT1 36 (SUTURE) ×3 IMPLANT
SUT VIC AB 0 CT1 8-18 (SUTURE) ×6
SUT VIC AB 2-0 CT1 27 (SUTURE) ×6
SUT VIC AB 2-0 CT1 TAPERPNT 27 (SUTURE) ×2 IMPLANT
SUT VICRYL 0 TIES 12 18 (SUTURE) ×3 IMPLANT
TOWEL OR 17X24 6PK STRL BLUE (TOWEL DISPOSABLE) ×6 IMPLANT
TRAY FOLEY CATH SILVER 14FR (SET/KITS/TRAYS/PACK) ×3 IMPLANT
WATER STERILE IRR 1000ML POUR (IV SOLUTION) ×3 IMPLANT

## 2016-02-08 NOTE — Anesthesia Procedure Notes (Signed)
Procedure Name: Intubation Date/Time: 02/08/2016 2:44 PM Performed by: Talbot Grumbling Pre-anesthesia Checklist: Patient identified, Emergency Drugs available, Suction available and Patient being monitored Patient Re-evaluated:Patient Re-evaluated prior to inductionOxygen Delivery Method: Circle system utilized Preoxygenation: Pre-oxygenation with 100% oxygen Intubation Type: IV induction Ventilation: Mask ventilation without difficulty Laryngoscope Size: Miller and 2 Grade View: Grade I Tube type: Oral Tube size: 7.0 mm Number of attempts: 1 Airway Equipment and Method: Stylet Placement Confirmation: ETT inserted through vocal cords under direct vision,  positive ETCO2 and breath sounds checked- equal and bilateral Secured at: 22 cm Tube secured with: Tape Dental Injury: Teeth and Oropharynx as per pre-operative assessment

## 2016-02-08 NOTE — H&P (Signed)
Chief Complaint  Patient presents with  . BTB on OCP and after ablation, for [facility] and BS    HPI Gwendolyn Russell is a 50 y.o. female. [id number] Patient's last menstrual period was 08/25/2015. Continuous BTB on OCP,present to have vaginal hysterectomy  HPI  Past Medical History  Diagnosis Date  . Hypertension   . Anxiety   . Insomnia   . GERD (gastroesophageal reflux disease)   . Abnormal Pap smear   . DVT of leg (deep venous thrombosis), left   . Obesity     Past Surgical History  Procedure Laterality Date  . Tonsillectomy    . Carpal tunnel release    . Bunionectomy    . Laser ablation condyloma cervical / vulvar    . Tubal ligation    . Leep    . Colposcopy    . 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 Heart disease Maternal Grandmother   . Diabetes Father   . Heart disease Father   . Deep vein thrombosis Father   . Early death Mother   . Breast cancer Sister   . Diabetes Sister     Social History Social History  Substance Use Topics  . Smoking status: Never Smoker   . Smokeless tobacco: Never Used  . Alcohol Use: No    Allergies  Allergen Reactions  . Cephalosporins Hives  . Clindamycin Itching  . Sulfa Antibiotics   . Tdap [Diphth-Acell Pertussis-Tetanus]   . Tape Rash    Current Outpatient Prescriptions  Medication Sig Dispense Refill  . acetaminophen (TYLENOL) 500 MG tablet Take by mouth.    . ALPRAZolam (XANAX) 1 MG tablet Take 1 mg by mouth 3 (three) times daily.    . Biotin 5000 MCG CAPS Take 1 capsule by mouth daily.     . Cholecalciferol (VITAMIN D3) 2000 units TABS Take 2 tablets by mouth daily.     . cyclobenzaprine (FLEXERIL) 5 MG tablet Take 5 mg by mouth at bedtime. Takes 10mg  nightly    . diclofenac sodium  (VOLTAREN) 1 % GEL 4 (four) times a day as needed.     . diphenhydrAMINE (BENADRYL) 25 MG tablet Take 25 mg by mouth.    Marland Kitchen FLUoxetine (PROZAC) 40 MG capsule Take 40 mg by mouth daily.    Marland Kitchen gabapentin (NEURONTIN) 300 MG capsule Take 300 mg by mouth 3 (three) times daily.    . metoCLOPramide (REGLAN) 10 MG tablet     . mirtazapine (REMERON) 15 MG tablet Take 15 mg by mouth at bedtime.    . Multiple Vitamins-Minerals (MULTIVITAMIN WITH MINERALS) tablet Take 1 tablet by mouth daily.    . Norethindrone-Mestranol (NECON) 1-50 MG-MCG tablet Take 1 tablet by mouth daily. 1 Package 11  . nystatin (MYCOSTATIN) powder     . omeprazole (PRILOSEC) 40 MG capsule Take 40 mg by mouth daily.    . ondansetron (ZOFRAN) 4 MG tablet Take 4 mg by mouth.    . prazosin (MINIPRESS) 2 MG capsule Take 2 mg by mouth at bedtime.    . Suvorexant (BELSOMRA) 20 MG TABS Take by mouth.    . topiramate (TOPAMAX) 50 MG tablet Take 50 mg by mouth daily.    Marland Kitchen triamcinolone cream (KENALOG) 0.1 %     . ursodiol (ACTIGALL) 300 MG capsule Take one tablet twice a day with food beginning 14 days after surgery.    . vitamin B-12 (CYANOCOBALAMIN) 1000  MCG tablet Take 1,000 mcg by mouth.    . zolpidem (AMBIEN) 5 MG tablet Take 10 mg by mouth at bedtime as needed.      No current facility-administered medications for this visit.    Review of Systems Review of Systems  Constitutional: Negative.  Gastrointestinal: Negative.  Genitourinary: Positive for vaginal bleeding, menstrual problem and pelvic pain. Negative for vaginal discharge.   Blood pressure 132/85, pulse 72, temperature 98.5 F (36.9 C), temperature source Oral, resp. rate 18, last menstrual period 08/25/2015, SpO2 9 %.    Physical Exam Physical Exam  Constitutional: She is oriented to person, place, and time. She appears well-developed. No distress.  Pulmonary/Chest: Effort  normal.  Neurological: She is alert and oriented to person, place, and time.  Skin: No pallor.  Psychiatric: She has a normal mood and affect. Her behavior is normal.    Data Reviewed Op note ablation, pap and [place] result and endometrial biopsy CBC    Component Value Date/Time   WBC 5.5 01/28/2016 1040   RBC 4.30 01/28/2016 1040   HGB 11.5* 01/28/2016 1040   HCT 37.0 01/28/2016 1040   PLT 292 01/28/2016 1040   MCV 86.0 01/28/2016 1040   MCH 26.7 01/28/2016 1040   MCHC 31.1 01/28/2016 1040   RDW 14.2 01/28/2016 1040   LYMPHSABS 2.0 05/28/2015 1905   MONOABS 0.6 05/28/2015 1905   EOSABS 0.1 05/28/2015 1905   BASOSABS 0.0 05/28/2015 1905     Assessment    DUB on OCP, dysmenorrhea H/o ablation     Plan    TVH, BS scheduled. .  The risks of surgery were discussed in detail with the patient including but not limited to: bleeding which may require transfusion or reoperation; infection which may require prolonged hospitalization or re-hospitalization and antibiotic therapy; injury to bowel, bladder, ureters and major vessels or other surrounding organs; need for additional procedures including laparotomy; thromboembolic phenomenon, incisional problems and other postoperative or anesthesia complications.  Patient was told that the likelihood that her condition and symptoms will be treated effectively with this surgical management was very high; the postoperative expectations were also discussed in detail. The patient also understands the alternative treatment options which were discussed in full. All questions were answered.             Woodroe Mode, MD 02/08/2016

## 2016-02-08 NOTE — Op Note (Signed)
Gwendolyn Russell PROCEDURE DATE: 02/08/2016 PREOPERATIVE DIAGNOSIS:  Dysmenorrhea, DUB, adenomyosis POSTOPERATIVE DIAGNOSIS: same SURGEON:   Woodroe Mode, MD  ASSISTANT: Lavonia Drafts, M.D. OPERATION:  Total Vaginal hysterectomy ANESTHESIA:  General endotracheal.  INDICATIONS: The patient is a 50 y.o. [id number] with history of symptomatic uterine fibroids/menorrhagia. The patient made a decision to undergo definite surgical treatment. On the preoperative visit, the risks, benefits, indications, and alternatives of the procedure were reviewed with the patient.  On the day of surgery, the risks of surgery were again discussed with the patient including but not limited to: bleeding which may require transfusion or reoperation; infection which may require antibiotics; injury to bowel, bladder, ureters or other surrounding organs; need for additional procedures; thromboembolic phenomenon, incisional problems and other postoperative/anesthesia complications. Written informed consent was obtained.    OPERATIVE FINDINGS: A 6 week size uterus normal right ovary, left ovary and tubes not seen  ESTIMATED BLOOD LOSS: 50 ml FLUIDS:  1300 ml of Lactated Ringers URINE OUTPUT:  100 ml of clear yellow urine. SPECIMENS:  Uterus and cervix sent to pathology COMPLICATIONS:  None immediate.  DESCRIPTION OF PROCEDURE:  The patient received intravenous antibiotics and had sequential compression devices applied to her lower extremities while in the preoperative area.  She was then taken to the operating room where general anesthesia was administered and was found to be adequate.  She was placed in the dorsal lithotomy position, and was prepped and draped in a sterile manner.  A Foley catheter was inserted into her bladder and attached to constant drainage. After an adequate timeout was performed, attention was turned to her pelvis.  A weighted speculum was then placed in the vagina, and the anterior and  posterior lips of the cervix were grasped bilaterally with tenaculums.  The cervix was then injected circumferentially with 0.25% Marcaine with epinephrine solution to maintain hemostasis.  The cervix was then circumferentially incised, and the bladder was dissected off the pubocervical fascia anteriorly without complication.  The posterior cul-de-sac was then entered sharply without difficulty and a   long weighted speculum was inserted into the posterior cul-de-sac.  The Heaney clamp was then used to clamp the uterosacral ligaments on either side.  They were then cut and sutured ligated with 0 Vicryl Of note, all sutures used in this case were 0 Vicryl unless otherwise noted.   The cardinal ligaments were then cauterized with the Ligasure device and cut The uterine vessels and broad ligaments were then serially cauterized and cut on both sides.  Excellent hemostasis was noted at this point.  The uterus was then delivered via the posterior cul-de-sac, and the cornua were clamped with the Heaney clamps, transected, and the uterus was delivered and sent to pathology. These pedicles were then suture ligated to ensure hemostasis.  After completion of the hysterectomy, all pedicles from the uterosacral ligament to the cornua were examined hemostasis was confirmed.  The right ovary was seen as normal but the left adnexa and right tube were not seen. The vaginal cuff was then closed with a in a running locked fashion with care given to incorporate the uterosacral pedicles bilaterally.  All instruments were then removed from the pelvis. The patient tolerated the procedure well.  All instruments, needles, and sponge counts were correct x 2. The patient was taken to the recovery room in stable condition.    Woodroe Mode, MD , [place] Attending [place], [facility]

## 2016-02-08 NOTE — Anesthesia Preprocedure Evaluation (Addendum)
Anesthesia Evaluation  Patient identified by MRN, date of birth, ID band Patient awake    Reviewed: Allergy & Precautions, H&P , NPO status , Patient's Chart, lab work & pertinent test results, reviewed documented beta blocker date and time   History of Anesthesia Complications (+) PONV and history of anesthetic complications  Airway Mallampati: II  TM Distance: >3 FB Neck ROM: full    Dental no notable dental hx. (+) Dental Advisory Given, Partial Upper,  Bridge upper:   Pulmonary neg pulmonary ROS,    Pulmonary exam normal        Cardiovascular + Peripheral Vascular Disease  Normal cardiovascular exam     Neuro/Psych  Headaches,    GI/Hepatic Neg liver ROS, GERD  Medicated and Controlled,  Endo/Other  Morbid obesity  Renal/GU negative Renal ROS     Musculoskeletal  (+) Arthritis ,   Abdominal (+) + obese,   Peds  Hematology negative hematology ROS (+) anemia ,   Anesthesia Other Findings   Reproductive/Obstetrics                          Anesthesia Physical Anesthesia Plan  ASA: III  Anesthesia Plan: General   Post-op Pain Management:    Induction: Intravenous  Airway Management Planned: Oral ETT  Additional Equipment:   Intra-op Plan:   Post-operative Plan: Extubation in OR  Informed Consent: I have reviewed the patients History and Physical, chart, labs and discussed the procedure including the risks, benefits and alternatives for the proposed anesthesia with the patient or authorized representative who has indicated his/her understanding and acceptance.   Dental advisory given  Plan Discussed with: CRNA and Surgeon  Anesthesia Plan Comments:         Anesthesia Quick Evaluation

## 2016-02-08 NOTE — Transfer of Care (Signed)
Immediate Anesthesia Transfer of Care Note  Patient: Gwendolyn Russell  Procedure(s) Performed: Procedure(s): HYSTERECTOMY VAGINAL (N/A)  Patient Location: PACU  Anesthesia Type:General  Level of Consciousness: awake and patient cooperative  Airway & Oxygen Therapy: Patient Spontanous Breathing and Patient connected to nasal cannula oxygen  Post-op Assessment: Report given to RN and Post -op Vital signs reviewed and stable  Post vital signs: Reviewed and stable  Last Vitals:  Filed Vitals:   02/08/16 1313  BP: 132/85  Pulse: 72  Temp: 36.9 C  Resp: 18    Complications: No apparent anesthesia complications

## 2016-02-09 ENCOUNTER — Encounter (HOSPITAL_COMMUNITY): Payer: Self-pay | Admitting: Obstetrics & Gynecology

## 2016-02-09 DIAGNOSIS — N938 Other specified abnormal uterine and vaginal bleeding: Secondary | ICD-10-CM | POA: Diagnosis not present

## 2016-02-09 LAB — CBC
HCT: 37 % (ref 36.0–46.0)
HEMOGLOBIN: 11.6 g/dL — AB (ref 12.0–15.0)
MCH: 26.8 pg (ref 26.0–34.0)
MCHC: 31.4 g/dL (ref 30.0–36.0)
MCV: 85.5 fL (ref 78.0–100.0)
PLATELETS: 240 10*3/uL (ref 150–400)
RBC: 4.33 MIL/uL (ref 3.87–5.11)
RDW: 13.1 % (ref 11.5–15.5)
WBC: 8 10*3/uL (ref 4.0–10.5)

## 2016-02-09 MED ORDER — OXYCODONE-ACETAMINOPHEN 5-325 MG PO TABS: 1.0000 | ORAL_TABLET | ORAL | Status: DC | PRN

## 2016-02-09 NOTE — Plan of Care (Signed)
Problem: Urinary Elimination: Goal: Ability to reestablish a normal urinary elimination pattern will improve Outcome: Completed/Met Date Met:  02/09/16 Pt urinated x1, 100cc bloody urine, unable to void more at this time, pt is comfortable. Notified Dr. Ihor Dow that pt is eager to leave and her ride is here. She said to allow pt to leave and have her attempt voiding within the hour. Marry Guan

## 2016-02-09 NOTE — Discharge Summary (Signed)
Physician Discharge Summary  Patient ID: Gwendolyn Russell MRN: [id number] DOB/AGE: 51/31/1967 51 y.o.  Admit date: 02/08/2016 Discharge date: 02/09/2016  Admission Diagnoses:DUB, adenomyosis  Discharge Diagnoses: same Active Problems:   Excessive or frequent menstruation   Discharged Condition: good  Hospital Course:  Chief Complaint  Patient presents with  . BTB on OCP and after ablation, for [facility] and BS    HPI Gwendolyn Russell is a 50 y.o. female. [id number] Patient's last menstrual period was 08/25/2015. Continuous BTB on OCP,present to have vaginal hysterectomy  HPI  Past Medical History  Diagnosis Date  . Hypertension   . Anxiety   . Insomnia   . GERD (gastroesophageal reflux disease)   . Abnormal Pap smear   . DVT of leg (deep venous thrombosis), left   . Obesity     Past Surgical History  Procedure Laterality Date  . Tonsillectomy    . Carpal tunnel release    . Bunionectomy    . Laser ablation condyloma cervical / vulvar    . Tubal ligation    . Leep    . Colposcopy    . 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 Heart disease Maternal Grandmother   . Diabetes Father   . Heart disease Father   . Deep vein thrombosis Father   . Early death Mother   . Breast cancer Sister   . Diabetes Sister     Social History Social History  Substance Use Topics  . Smoking status: Never Smoker   . Smokeless tobacco: Never Used  . Alcohol Use: No    Allergies  Allergen Reactions  . Cephalosporins Hives  . Clindamycin Itching  . Sulfa Antibiotics   . Tdap [Diphth-Acell Pertussis-Tetanus]   . Tape Rash    Current Outpatient Prescriptions   Medication Sig Dispense Refill  . acetaminophen (TYLENOL) 500 MG tablet Take by mouth.    . ALPRAZolam (XANAX) 1 MG tablet Take 1 mg by mouth 3 (three) times daily.    . Biotin 5000 MCG CAPS Take 1 capsule by mouth daily.     . Cholecalciferol (VITAMIN D3) 2000 units TABS Take 2 tablets by mouth daily.     . cyclobenzaprine (FLEXERIL) 5 MG tablet Take 5 mg by mouth at bedtime. Takes 10mg  nightly    . diclofenac sodium (VOLTAREN) 1 % GEL 4 (four) times a day as needed.     . diphenhydrAMINE (BENADRYL) 25 MG tablet Take 25 mg by mouth.    Marland Kitchen FLUoxetine (PROZAC) 40 MG capsule Take 40 mg by mouth daily.    Marland Kitchen gabapentin (NEURONTIN) 300 MG capsule Take 300 mg by mouth 3 (three) times daily.    . metoCLOPramide (REGLAN) 10 MG tablet     . mirtazapine (REMERON) 15 MG tablet Take 15 mg by mouth at bedtime.    . Multiple Vitamins-Minerals (MULTIVITAMIN WITH MINERALS) tablet Take 1 tablet by mouth daily.    . Norethindrone-Mestranol (NECON) 1-50 MG-MCG tablet Take 1 tablet by mouth daily. 1 Package 11  . nystatin (MYCOSTATIN) powder     . omeprazole (PRILOSEC) 40 MG capsule Take 40 mg by mouth daily.    . ondansetron (ZOFRAN) 4 MG tablet Take 4 mg by mouth.    . prazosin (MINIPRESS) 2 MG capsule Take 2 mg by mouth at bedtime.    . Suvorexant (BELSOMRA) 20 MG TABS Take by mouth.    . topiramate (TOPAMAX) 50 MG  tablet Take 50 mg by mouth daily.    Marland Kitchen triamcinolone cream (KENALOG) 0.1 %     . ursodiol (ACTIGALL) 300 MG capsule Take one tablet twice a day with food beginning 14 days after surgery.    . vitamin B-12 (CYANOCOBALAMIN) 1000 MCG tablet Take 1,000 mcg by mouth.    . zolpidem (AMBIEN) 5 MG tablet Take 10 mg by mouth at bedtime as needed.      No current  facility-administered medications for this visit.    Review of Systems Review of Systems  Constitutional: Negative.  Gastrointestinal: Negative.  Genitourinary: Positive for vaginal bleeding, menstrual problem and pelvic pain. Negative for vaginal discharge.           Author: Woodroe Mode, MD Service: Obstetrics/Gynecology Author Type: Physician    Filed: 02/08/2016 4:01 PM Note Time: 02/08/2016 3:53 PM Status: Signed   Editor: Woodroe Mode, MD (Physician)     Expand All Collapse All   Gwendolyn Russell PROCEDURE DATE: 02/08/2016 PREOPERATIVE DIAGNOSIS: Dysmenorrhea, DUB, adenomyosis POSTOPERATIVE DIAGNOSIS: same SURGEON: Woodroe Mode, MD  ASSISTANT: Lavonia Drafts, M.D. OPERATION: Total Vaginal hysterectomy ANESTHESIA: General endotracheal.  INDICATIONS: The patient is a 50 y.o. [id number] with history of symptomatic uterine fibroids/menorrhagia. The patient made a decision to undergo definite surgical treatment. On the preoperative visit, the risks, benefits, indications, and alternatives of the procedure were reviewed with the patient. On the day of surgery, the risks of surgery were again discussed with the patient including but not limited to: bleeding which may require transfusion or reoperation; infection which may require antibiotics; injury to bowel, bladder, ureters or other surrounding organs; need for additional procedures; thromboembolic phenomenon, incisional problems and other postoperative/anesthesia complications. Written informed consent was obtained.   OPERATIVE FINDINGS: A 6 week size uterus normal right ovary, left ovary and tubes not seen  ESTIMATED BLOOD LOSS: 50 ml FLUIDS: 1300 ml of Lactated Ringers URINE OUTPUT: 100 ml of clear yellow urine. SPECIMENS: Uterus and cervix sent to pathology COMPLICATIONS: None immediate.  DESCRIPTION OF PROCEDURE: The patient received intravenous antibiotics and had sequential compression  devices applied to her lower extremities while in the preoperative area. She was then taken to the operating room where general anesthesia was administered and was found to be adequate. She was placed in the dorsal lithotomy position, and was prepped and draped in a sterile manner. A Foley catheter was inserted into her bladder and attached to constant drainage. After an adequate timeout was performed, attention was turned to her pelvis. A weighted speculum was then placed in the vagina, and the anterior and posterior lips of the cervix were grasped bilaterally with tenaculums. The cervix was then injected circumferentially with 0.25% Marcaine with epinephrine solution to maintain hemostasis. The cervix was then circumferentially incised, and the bladder was dissected off the pubocervical fascia anteriorly without complication. The posterior cul-de-sac was then entered sharply without difficulty and a long weighted speculum was inserted into the posterior cul-de-sac. The Heaney clamp was then used to clamp the uterosacral ligaments on either side. They were then cut and sutured ligated with 0 Vicryl Of note, all sutures used in this case were 0 Vicryl unless otherwise noted. The cardinal ligaments were then cauterized with the Ligasure device and cut The uterine vessels and broad ligaments were then serially cauterized and cut on both sides. Excellent hemostasis was noted at this point. The uterus was then delivered via the posterior cul-de-sac, and the cornua were clamped with the Heaney clamps, transected,  and the uterus was delivered and sent to pathology. These pedicles were then suture ligated to ensure hemostasis. After completion of the hysterectomy, all pedicles from the uterosacral ligament to the cornua were examined hemostasis was confirmed. The right ovary was seen as normal but the left adnexa and right tube were not seen. The vaginal cuff was then closed with a in a running locked  fashion with care given to incorporate the uterosacral pedicles bilaterally. All instruments were then removed from the pelvis. The patient tolerated the procedure well. All instruments, needles, and sponge counts were correct x 2. The patient was taken to the recovery room in stable condition.   Woodroe Mode, MD , [place] Attending [place], [place]        Consults: None  Significant Diagnostic Studies: labs:  CBC    Component Value Date/Time   WBC 8.0 02/09/2016 0616   RBC 4.33 02/09/2016 0616   HGB 11.6* 02/09/2016 0616   HCT 37.0 02/09/2016 0616   PLT 240 02/09/2016 0616   MCV 85.5 02/09/2016 0616   MCH 26.8 02/09/2016 0616   MCHC 31.4 02/09/2016 0616   RDW 13.1 02/09/2016 0616   LYMPHSABS 2.0 05/28/2015 1905   MONOABS 0.6 05/28/2015 1905   EOSABS 0.1 05/28/2015 1905   BASOSABS 0.0 05/28/2015 1905      Treatments: surgery: TVH  Discharge Exam: Blood pressure 137/64, pulse 67, temperature 98.1 F (36.7 C), temperature source Oral, resp. rate 16, height 5\' 6"  (1.676 m), weight 242 lb (109.77 kg), last menstrual period 08/25/2015, SpO2 97 %. General appearance: alert, cooperative and no distress GI: soft, non-tender; bowel sounds normal; no masses,  no organomegaly Extremities: extremities normal, atraumatic, no cyanosis or edema  Disposition: 01-Home or Self Care     Medication List    STOP taking these medications        acetaminophen 500 MG tablet  Commonly known as:  TYLENOL      TAKE these medications        ALPRAZolam 1 MG tablet  Commonly known as:  XANAX  Take 1 mg by mouth 3 (three) times daily.     BELSOMRA 20 MG Tabs  Generic drug:  Suvorexant  Take 20 mg by mouth daily as needed (allergies).     Biotin 5000 MCG Caps  Take 1 capsule by mouth daily.     cyclobenzaprine 10 MG tablet  Commonly known as:  FLEXERIL  Take 10 mg by mouth at bedtime.     DEXILANT 60 MG capsule   Generic drug:  dexlansoprazole  Take 60 mg by mouth at bedtime.     diclofenac sodium 1 % Gel  Commonly known as:  VOLTAREN  4 (four) times a day as needed. for pain     diphenhydrAMINE 25 MG tablet  Commonly known as:  BENADRYL  Take 25 mg by mouth every 8 (eight) hours as needed for allergies.     FLUoxetine 40 MG capsule  Commonly known as:  PROZAC  Take 40 mg by mouth daily.     gabapentin 300 MG capsule  Commonly known as:  NEURONTIN  Take 300 mg by mouth 3 (three) times daily.     METAMUCIL PO  Take 2 capsules by mouth daily.     metoCLOPramide 10 MG tablet  Commonly known as:  REGLAN  Take 10 mg by mouth every 6 (six) hours as needed for nausea.     Norethindrone-Mestranol 1-50 MG-MCG  tablet  Commonly known as:  NECON  Take 1 tablet by mouth daily.     nortriptyline 25 MG capsule  Commonly known as:  PAMELOR  Take 25 mg by mouth at bedtime.     nystatin powder  Commonly known as:  MYCOSTATIN  Apply 1 Bottle topically as needed (rash).     ondansetron 4 MG tablet  Commonly known as:  ZOFRAN  Take 4 mg by mouth daily.     oxyCODONE-acetaminophen 5-325 MG tablet  Commonly known as:  PERCOCET/ROXICET  Take 1-2 tablets by mouth every 4 (four) hours as needed for moderate pain or severe pain.     prazosin 2 MG capsule  Commonly known as:  MINIPRESS  Take 2 mg by mouth at bedtime.     prenatal multivitamin Tabs tablet  Take 1 tablet by mouth daily at 12 noon.     topiramate 50 MG tablet  Commonly known as:  TOPAMAX  Take 50 mg by mouth 3 (three) times daily.     triamcinolone cream 0.1 %  Commonly known as:  KENALOG  Apply 1 application topically as needed (rash).     vitamin B-12 1000 MCG tablet  Commonly known as:  CYANOCOBALAMIN  Take 1,000 mcg by mouth daily.     Vitamin D3 2000 units Tabs  Take 2 tablets by mouth daily.     zolpidem 10 MG tablet  Commonly known as:  AMBIEN  Take 10 mg by mouth at bedtime.           Follow-up  Information    Follow up with [facility] In 4 weeks.   Specialty:  Obstetrics and Gynecology   Contact information:   [address] [place] [phone]      Signed: Emeterio Reeve 02/09/2016, 2:19 PM

## 2016-02-09 NOTE — Progress Notes (Signed)
Pt verbalizes understanding of d/c instructions, medications, follow up appts, when to seek medical attention, and belongings policy. IV was d/c without complications earlier in the day by NT. Pt was able to void. Pts pain is well controlled. I ambulated with the patient to the main entrance accompanied by her family/friend. Marry Guan

## 2016-02-09 NOTE — Discharge Instructions (Signed)
Hysterectomy, Care After These instructions give you information on caring for yourself after your procedure. Your doctor may also give you more specific instructions. Call your doctor if you have any problems or questions after your procedure.  HOME CARE It takes 4-6 weeks to recover from this surgery. Follow all of your doctor's instructions.   Only take medicines as told by your doctor.  Change your bandage as told by your doctor.  Return to your doctor to have your stitches taken out.  Take showers for 2-3 weeks. Ask your doctor when it is okay to shower.  Do not douche, use tampons, or have sex (intercourse) for at least 6 weeks or as told.  Follow your doctor's advice about exercise, lifting objects, driving, and general activities.  Get plenty of rest and sleep.  Do not lift anything heavier than a gallon of milk (about 10 pounds [4.5 kilograms]) for the first month after surgery.  Get back to your normal diet as told by your doctor.  Do not drink alcohol until your doctor says it is okay.  Take a medicine to help you poop (laxative) as told by your doctor.  Eating foods high in fiber may help you poop. Eat a lot of raw fruits and vegetables, whole grains, and beans.  Drink enough fluids to keep your pee (urine) clear or pale yellow.  Have someone help you at home for 1-2 weeks after your surgery.  Keep follow-up doctor visits as told. GET HELP IF:  You have chills or fever.  You have puffiness, redness, or pain in area of the cut (incision).  You have yellowish-white fluid (pus) coming from the cut.  You have a bad smell coming from the cut or bandage.  Your cut pulls apart.  You feel dizzy or light-headed.  You have pain or bleeding when you pee.  You keep having watery poop (diarrhea).  You keep feeling sick to your stomach (nauseous) or keep throwing up (vomiting).  You have fluid (discharge) coming from your vagina.  You have a rash.  You have a  reaction to your medicine.  You need stronger pain medicine. GET HELP RIGHT AWAY IF:   You have a fever and your symptoms suddenly get worse.  You have bad belly (abdominal) pain.  You have chest pain.  You are short of breath.  You pass out (faint).  You have pain, puffiness, or redness of your leg.  You bleed a lot from your vagina and notice clumps of tissue (clots). MAKE SURE YOU:   Understand these instructions.  Will watch your condition.  Will get help right away if you are not doing well or get worse.   This information is not intended to replace advice given to you by your health care provider. Make sure you discuss any questions you have with your health care provider.   Document Released: 08/08/2008 Document Revised: 11/04/2013 Document Reviewed: 08/22/2013 Elsevier Interactive Patient Education [employer].

## 2016-02-09 NOTE — Anesthesia Postprocedure Evaluation (Signed)
Anesthesia Post Note  Patient: Gwendolyn Russell  Procedure(s) Performed: Procedure(s) (LRB): HYSTERECTOMY VAGINAL (N/A)  Patient location during evaluation: [facility] Anesthesia Type: General Level of consciousness: awake and alert and oriented Pain management: pain level controlled Vital Signs Assessment: post-procedure vital signs reviewed and stable Respiratory status: spontaneous breathing Cardiovascular status: blood pressure returned to baseline Postop Assessment: no signs of nausea or vomiting and adequate PO intake Anesthetic complications: no    Last Vitals:  Filed Vitals:   02/09/16 0245 02/09/16 0542  BP: 131/59 145/71  Pulse: 68 70  Temp: 36.5 C 36.6 C  Resp: 16 16    Last Pain:  Filed Vitals:   02/09/16 0628  PainSc: 0-No pain                 Roylene Heaton

## 2016-03-20 ENCOUNTER — Ambulatory Visit (INDEPENDENT_AMBULATORY_CARE_PROVIDER_SITE_OTHER): Payer: BLUE CROSS/BLUE SHIELD | Admitting: Obstetrics & Gynecology

## 2016-03-20 VITALS — BP 113/60 | HR 78 | Ht 66.0 in | Wt 228.0 lb

## 2016-03-20 DIAGNOSIS — Z09 Encounter for follow-up examination after completed treatment for conditions other than malignant neoplasm: Secondary | ICD-10-CM

## 2016-03-20 DIAGNOSIS — G43001 Migraine without aura, not intractable, with status migrainosus: Secondary | ICD-10-CM

## 2016-03-20 MED ORDER — ESTRADIOL 0.05 MG/24HR TD PTWK: 0.0500 mg | MEDICATED_PATCH | TRANSDERMAL | Status: DC

## 2016-03-20 NOTE — Progress Notes (Signed)
Subjective:feels that migraine responded to use of Ortho Evra     Gwendolyn Russell is a 50 y.o. female who presents to the clinic 6 weeks status post TVH for abnormal uterine bleeding. Eating a regular diet without difficulty. Bowel movements are normal. The patient is not having any pain. Headaches only c/w migraine  The following portions of the patient's history were reviewed and updated as appropriate: allergies, current medications, past family history, past medical history, past social history, past surgical history and problem list.  Review of Systems Pertinent items are noted in HPI.    Objective:    BP 113/60 mmHg  Pulse 78  Ht 5\' 6"  (1.676 m)  Wt 228 lb (103.42 kg)  BMI 36.82 kg/m2 General:  alert, cooperative and no distress  Abdomen: soft, bowel sounds active, non-tender        Assessment:    Doing well postoperatively. Operative findings again reviewed. Pathology report discussed.   wants to try ERT Plan:    1. Continue any current medications. 2. Wound care discussed. 3. Activity restrictions: none 4. Anticipated return to work: now. 5. Climara patch 0.05 mg  Woodroe Mode, MD 03/20/2016

## 2016-03-20 NOTE — Patient Instructions (Signed)
Hormone Therapy At menopause, your body begins making less estrogen and progesterone hormones. This causes the body to stop having menstrual periods. This is because estrogen and progesterone hormones control your periods and menstrual cycle. A lack of estrogen may cause symptoms such as:  Hot flushes (or hot flashes).  Vaginal dryness.  Dry skin.  Loss of sex drive.  Risk of bone loss (osteoporosis). When this happens, you may choose to take hormone therapy to get back the estrogen lost during menopause. When the hormone estrogen is given alone, it is usually referred to as ET (Estrogen Therapy). When the hormone progestin is combined with estrogen, it is generally called HT (Hormone Therapy). This was formerly known as hormone replacement therapy (HRT). Your caregiver can help you make a decision on what will be best for you. The decision to use HT seems to change often as new studies are done. Many studies do not agree on the benefits of hormone replacement therapy. LIKELY BENEFITS OF HT INCLUDE PROTECTION FROM:  Hot Flushes (also called hot flashes) - A hot flush is a sudden feeling of heat that spreads over the face and body. The skin may redden like a blush. It is connected with sweats and sleep disturbance. Women going through menopause may have hot flushes a few times a month or several times per day depending on the woman.  Osteoporosis (bone loss) - Estrogen helps guard against bone loss. After menopause, a woman's bones slowly lose calcium and become weak and brittle. As a result, bones are more likely to break. The hip, wrist, and spine are affected most often. Hormone therapy can help slow bone loss after menopause. Weight bearing exercise and taking calcium with vitamin D also can help prevent bone loss. There are also medications that your caregiver can prescribe that can help prevent osteoporosis.  Vaginal dryness - Loss of estrogen causes changes in the vagina. Its lining may  become thin and dry. These changes can cause pain and bleeding during sexual intercourse. Dryness can also lead to infections. This can cause burning and itching. (Vaginal estrogen treatment can help relieve pain, itching, and dryness.)  Urinary tract infections are more common after menopause because of lack of estrogen. Some women also develop urinary incontinence because of low estrogen levels in the vagina and bladder.  Possible other benefits of estrogen include a positive effect on mood and short-term memory in women. RISKS AND COMPLICATIONS  Using estrogen alone without progesterone causes the lining of the uterus to grow. This increases the risk of lining of the uterus (endometrial) cancer. Your caregiver should give another hormone called progestin if you have a uterus.  Women who take combined (estrogen and progestin) HT appear to have an increased risk of breast cancer. The risk appears to be small, but increases throughout the time that HT is taken.  Combined therapy also makes the breast tissue slightly denser which makes it harder to read mammograms (breast X-rays).  Combined, estrogen and progesterone therapy can be taken together every day, in which case there may be spotting of blood. HT therapy can be taken cyclically in which case you will have menstrual periods. Cyclically means HT is taken for a set amount of days, then not taken, then this process is repeated.  HT may increase the risk of stroke, heart attack, breast cancer and forming blood clots in your leg.  Transdermal estrogen (estrogen that is absorbed through the skin with a patch or a cream) may have better results with:  Cholesterol.  Blood pressure.  Blood clots. Having the following conditions may indicate you should not have HT:  Endometrial cancer.  Liver disease.  Breast cancer.  Heart disease.  History of blood clots.  Stroke. TREATMENT   If you choose to take HT and have a uterus, usually  estrogen and progestin are prescribed.  Your caregiver will help you decide the best way to take the medications.  Possible ways to take estrogen include:  Pills.  Patches.  Gels.  Sprays.  Vaginal estrogen cream, rings and tablets.  It is best to take the lowest dose possible that will help your symptoms and take them for the shortest period of time that you can.  Hormone therapy can help relieve some of the problems (symptoms) that affect women at menopause. Before making a decision about HT, talk to your caregiver about what is best for you. Be well informed and comfortable with your decisions. HOME CARE INSTRUCTIONS   Follow your caregivers advice when taking the medications.  A Pap test is done to screen for cervical cancer.  The first Pap test should be done at age 34.  Between ages 80 and 52, Pap tests are repeated every 2 years.  Beginning at age 13, you are advised to have a Pap test every 3 years as long as the past 3 Pap tests have been normal.  Some women have medical problems that increase the chance of getting cervical cancer. Talk to your caregiver about these problems. It is especially important to talk to your caregiver if a new problem develops soon after your last Pap test. In these cases, your caregiver may recommend more frequent screening and Pap tests.  The above recommendations are the same for women who have or have not gotten the vaccine for HPV (human papillomavirus).  If you had a hysterectomy for a problem that was not a cancer or a condition that could lead to cancer, then you no longer need Pap tests. However, even if you no longer need a Pap test, a regular exam is a good idea to make sure no other problems are starting.  If you are between ages 20 and 60, and you have had normal Pap tests going back 10 years, you no longer need Pap tests. However, even if you no longer need a Pap test, a regular exam is a good idea to make sure no other problems  are starting.  If you have had past treatment for cervical cancer or a condition that could lead to cancer, you need Pap tests and screening for cancer for at least 20 years after your treatment.  If Pap tests have been discontinued, risk factors (such as a new sexual partner)need to be re-assessed to determine if screening should be resumed.  Some women may need screenings more often if they are at high risk for cervical cancer.  Get mammograms done as per the advice of your caregiver. SEEK IMMEDIATE MEDICAL CARE IF:  You develop abnormal vaginal bleeding.  You have pain or swelling in your legs, shortness of breath, or chest pain.  You develop dizziness or headaches.  You have lumps or changes in your breasts or armpits.  You have slurred speech.  You develop weakness or numbness of your arms or legs.  You have pain, burning, or bleeding when urinating.  You develop abdominal pain.   This information is not intended to replace advice given to you by your health care provider. Make sure you discuss any questions  you have with your health care provider.   Document Released: 07/29/2003 Document Revised: 03/16/2015 Document Reviewed: 05/03/2015 Elsevier Interactive Patient Education 2016 Elsevier Inc[employer]  

## 2016-04-14 ENCOUNTER — Telehealth: Payer: Self-pay

## 2016-04-14 DIAGNOSIS — Z3045 Encounter for surveillance of transdermal patch hormonal contraceptive device: Secondary | ICD-10-CM

## 2016-04-14 NOTE — Telephone Encounter (Signed)
Called patient and discussed her concerns. She stated that her urinary issue is not pain but urgency and the feeling that she cannot empty her bladder. She is only able to urinate small amounts at a time. She says this has been an issue since her hysterectomy in March. She saw Dr Roselie Awkward for f/u in May and forgot to tell him about it. Then when she recently saw her bariatric doctor she was telling him about it and he told her she needed to call us. I told her she needed to see Dr Roselie Awkward about this issue. Scheduled for 6/12 @ 2:30. Patient said she would come. In addition to this, she is having issues with acne which was controlled when she was on the Ortho-Evra patch but since the patch was changed by Dr Roselie Awkward at her last appt she has been breaking out on her face and back. I told her that I would send a message to Dr Roselie Awkward about this and see if he wanted to change her prescription. Will call her when he responds. Understanding voiced.

## 2016-04-14 NOTE — Telephone Encounter (Signed)
Pt called stating she is having some painful urination that she thinks is related to her having hysterctomy in March. She is also requesting a different estrogen patch the one she has is causing her to have bumps on her back. Please adise this patient.

## 2016-04-18 MED ORDER — NORELGESTROMIN-ETH ESTRADIOL 150-35 MCG/24HR TD PTWK: 1.0000 | MEDICATED_PATCH | TRANSDERMAL | Status: DC

## 2016-04-18 NOTE — Telephone Encounter (Signed)
Per Dr Roselie Awkward, ok to restart ortho evra patch. Prescription sent to pharmacy, called patient and let her know she can pick it up at her convenience. Patient voiced understanding.

## 2016-04-18 NOTE — Addendum Note (Signed)
Addended by: Riccardo Dubin on: 04/18/2016 08:32 AM   Modules accepted: Orders, Medications

## 2016-04-24 ENCOUNTER — Ambulatory Visit (INDEPENDENT_AMBULATORY_CARE_PROVIDER_SITE_OTHER): Payer: BLUE CROSS/BLUE SHIELD | Admitting: Obstetrics & Gynecology

## 2016-04-24 ENCOUNTER — Encounter: Payer: Self-pay | Admitting: Obstetrics & Gynecology

## 2016-04-24 VITALS — BP 121/59 | HR 65 | Wt 228.8 lb

## 2016-04-24 DIAGNOSIS — N390 Urinary tract infection, site not specified: Secondary | ICD-10-CM | POA: Diagnosis not present

## 2016-04-24 DIAGNOSIS — R350 Frequency of micturition: Secondary | ICD-10-CM | POA: Diagnosis not present

## 2016-04-24 LAB — POCT URINALYSIS DIP (DEVICE)
Bilirubin Urine: NEGATIVE
GLUCOSE, UA: NEGATIVE mg/dL
Hgb urine dipstick: NEGATIVE
KETONES UR: NEGATIVE mg/dL
Leukocytes, UA: NEGATIVE
NITRITE: NEGATIVE
PH: 5.5 (ref 5.0–8.0)
PROTEIN: NEGATIVE mg/dL
Specific Gravity, Urine: 1.02 (ref 1.005–1.030)
UROBILINOGEN UA: 0.2 mg/dL (ref 0.0–1.0)

## 2016-04-24 MED ORDER — OXYBUTYNIN CHLORIDE 5 MG PO TABS: 5.0000 mg | ORAL_TABLET | Freq: Two times a day (BID) | ORAL | Status: DC

## 2016-04-24 NOTE — Progress Notes (Signed)
Subjective:     Patient ID: Gwendolyn Russell, female   DOB: Oct 31, 1966, 50 y.o.   MRN: [id number] Cc: urinary frequency and urge  [id number] S/p TVH in March, now has urinary urge and frequency and nocturia, s/p pressure, no hematuria.  Past Medical History  Diagnosis Date  . Anxiety   . Insomnia   . GERD (gastroesophageal reflux disease)   . Abnormal Pap smear   . DVT of leg (deep venous thrombosis), left   . Obesity   . PONV (postoperative nausea and vomiting)   . SVD (spontaneous vaginal delivery)     x 2  . Depression   . Chronic back pain   . Hypertension     Hx weight loss surgery, no current meds  . Headache   . Arthritis     hips, knees, lower back  . Anemia     Hx  . 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 Vaginal delivery 1985, 1988   Past Surgical History  Procedure Laterality Date  . Tonsillectomy    . Carpal tunnel release Left   . Bunionectomy Bilateral   . Laser ablation condyloma cervical / vulvar    . Tubal ligation    . Leep    . Colposcopy    . Laparoscopic gastric sleeve resection  10/11/15  . Vaginal hysterectomy N/A 02/08/2016    Procedure: HYSTERECTOMY VAGINAL;  Surgeon: Woodroe Mode, MD;  Location: [facility];  Service: Gynecology;  Laterality: N/A;   Allergies  Allergen Reactions  . Cephalosporins Hives  . Clindamycin Itching  . Tdap [Diphth-Acell Pertussis-Tetanus]   . Sulfa Antibiotics Rash  . Tape Rash    Adhesive tape   Current Outpatient Prescriptions on File Prior to Visit  Medication Sig Dispense Refill  . ALPRAZolam (XANAX) 1 MG tablet Take 1 mg by mouth 3 (three) times daily.    . Biotin 5000 MCG CAPS Take 1 capsule by mouth daily.     . Cholecalciferol (VITAMIN D3) 2000 units TABS Take 2 tablets by mouth daily.     . cyclobenzaprine (FLEXERIL) 10 MG tablet Take 10 mg by mouth at bedtime.    Marland Kitchen dexlansoprazole (DEXILANT) 60 MG capsule Take 60 mg by mouth at bedtime. Reported on 03/20/2016     . diclofenac sodium (VOLTAREN) 1 % GEL 4 (four) times a day as needed. for pain    . diphenhydrAMINE (BENADRYL) 25 MG tablet Take 25 mg by mouth every 8 (eight) hours as needed for allergies. Reported on 03/20/2016    . FLUoxetine (PROZAC) 40 MG capsule Take 40 mg by mouth daily.    Marland Kitchen gabapentin (NEURONTIN) 300 MG capsule Take 300 mg by mouth 3 (three) times daily.    . metoCLOPramide (REGLAN) 10 MG tablet Take 10 mg by mouth every 6 (six) hours as needed for nausea.     . norelgestromin-ethinyl estradiol (ORTHO EVRA) 150-35 MCG/24HR transdermal patch Place 1 patch onto the skin once a week. 3 patch 12  . Norethindrone-Mestranol (NECON) 1-50 MG-MCG tablet Take 1 tablet by mouth daily. (Patient not taking: Reported on 01/26/2016) 1 Package 11  . nortriptyline (PAMELOR) 25 MG capsule Take 25 mg by mouth at bedtime.    Marland Kitchen nystatin (MYCOSTATIN) powder Apply 1 Bottle topically as needed (rash).     . ondansetron (ZOFRAN) 4 MG tablet Take 4 mg by mouth daily. Reported on 03/20/2016    . oxyCODONE-acetaminophen (PERCOCET/ROXICET) 5-325 MG tablet  Take 1-2 tablets by mouth every 4 (four) hours as needed for moderate pain or severe pain. (Patient not taking: Reported on 03/20/2016) 20 tablet 0  . prazosin (MINIPRESS) 2 MG capsule Take 2 mg by mouth at bedtime.    . Prenatal Vit-Fe Fumarate-FA (PRENATAL MULTIVITAMIN) TABS tablet Take 1 tablet by mouth daily at 12 noon.    . Psyllium (METAMUCIL PO) Take 2 capsules by mouth daily.    . Suvorexant (BELSOMRA) 20 MG TABS Take 20 mg by mouth daily as needed (allergies).     . topiramate (TOPAMAX) 50 MG tablet Take 50 mg by mouth 3 (three) times daily.     Marland Kitchen triamcinolone cream (KENALOG) 0.1 % Apply 1 application topically as needed (rash).     . vitamin B-12 (CYANOCOBALAMIN) 1000 MCG tablet Take 1,000 mcg by mouth daily.     Marland Kitchen zolpidem (AMBIEN) 10 MG tablet Take 10 mg by mouth at bedtime.     No current facility-administered medications on file prior to visit.     Review of Systems  Constitutional: Negative.   Genitourinary: Positive for dysuria, urgency, frequency and pelvic pain (pressure). Negative for hematuria, vaginal bleeding and menstrual problem.       Objective:   Physical Exam  Constitutional: She appears well-developed. No distress.  Skin: Skin is warm and dry.  Psychiatric: She has a normal mood and affect. Her behavior is normal.       Assessment:     Urinary urge, frequency, normal UA, possible OAB, IC    Plan:     Ditropan 5 mg BID Urine culture  Urology referral    Woodroe Mode, MD  04/24/2016

## 2016-04-24 NOTE — Patient Instructions (Signed)
Overactive Bladder, Adult Overactive bladder is a group of urinary symptoms. With overactive bladder, you may suddenly feel the need to pass urine (urinate) right away. After feeling this sudden urge, you might also leak urine if you cannot get to the bathroom fast enough (urinary incontinence). These symptoms might interfere with your daily work or social activities. Overactive bladder symptoms may also wake you up at night. Overactive bladder affects the nerve signals between your bladder and your brain. Your bladder may get the signal to empty before it is full. Very sensitive muscles can also make your bladder squeeze too soon. CAUSES Many things can cause an overactive bladder. Possible causes include:  Urinary tract infection.  Infection of nearby tissues, such as the prostate.  Prostate enlargement.  Being pregnant with twins or more (multiples).  Surgery on the uterus or urethra.  Bladder stones, inflammation, or tumors.  Drinking too much caffeine or alcohol.  Certain medicines, especially those that you take to help your body get rid of extra fluid (diuretics) by increasing urine production.  Muscle or nerve weakness, especially from:  A spinal cord injury.  Stroke.  Multiple sclerosis.  Parkinson disease.  Diabetes. This can cause a high urine volume that fills the bladder so quickly that the normal urge to urinate is triggered very strongly.  Constipation. A buildup of too much stool can put pressure on your bladder. RISK FACTORS You may be at greater risk for overactive bladder if you:  Are an older adult.  Smoke.  Are going through menopause.  Have prostate problems.  Have a neurological disease, such as stroke, dementia, Parkinson disease, or multiple sclerosis (MS).  Eat or drink things that irritate the bladder. These include alcohol, spicy food, and caffeine.  Are overweight or obese. SIGNS AND SYMPTOMS  The signs and symptoms of an overactive  bladder include:  Sudden, strong urges to urinate.  Leaking urine.  Urinating eight or more times per day.  Waking up to urinate two or more times per night. DIAGNOSIS Your health care provider may suspect overactive bladder based on your symptoms. The health care provider will do a physical exam and take your medical history. Blood or urine tests may also be done. For example, you might need to have a bladder function test to check how well you can hold your urine. You might also need to see a health care provider who specializes in the urinary tract (urologist). TREATMENT Treatment for overactive bladder depends on the cause of your condition and whether it is mild or severe. Certain treatments can be done in your health care provider's office or clinic. You can also make lifestyle changes at home. Options include: Behavioral Treatments  Biofeedback. A specialist uses sensors to help you become aware of your body's signals.  Keeping a daily log of when you need to urinate and what happens after the urge. This may help you manage your condition.  Bladder training. This helps you learn to control the urge to urinate by following a schedule that directs you to urinate at regular intervals (timed voiding). At first, you might have to wait a few minutes after feeling the urge. In time, you should be able to schedule bathroom visits an hour or more apart.  Kegel exercises. These are exercises to strengthen the pelvic floor muscles, which support the bladder. Toning these muscles can help you control urination, even if your bladder muscles are overactive. A specialist will teach you how to do these exercises correctly. They   require daily practice.  Weight loss. If you are obese or overweight, losing weight might relieve your symptoms of overactive bladder. Talk to your health care provider about losing weight and whether there is a specific program or method that would work best for you.  Diet  change. This might help if constipation is making your overactive bladder worse. Your health care provider or a dietitian can explain ways to change what you eat to ease constipation. You might also need to consume less alcohol and caffeine or drink other fluids at different times of the day.  Stopping smoking.  Wearing pads to absorb leakage while you wait for other treatments to take effect. Physical Treatments  Electrical stimulation. Electrodes send gentle pulses of electricity to strengthen the nerves or muscles that help to control the bladder. Sometimes, the electrodes are placed outside of the body. In other cases, they might be placed inside the body (implanted). This treatment can take several months to have an effect.  Supportive devices. Women may need a plastic device that fits into the vagina and supports the bladder (pessary). Medicines Several medicines can help treat overactive bladder and are usually used along with other treatments. Some are injected into the muscles involved in urination. Others come in pill form. Your health care provider may prescribe:  Antispasmodics. These medicines block the signals that the nerves send to the bladder. This keeps the bladder from releasing urine at the wrong time.  Tricyclic antidepressants. These types of antidepressants also relax bladder muscles. Surgery  You may have a device implanted to help manage the nerve signals that indicate when you need to urinate.  You may have surgery to implant electrodes for electrical stimulation.  Sometimes, very severe cases of overactive bladder require surgery to change the shape of the bladder. HOME CARE INSTRUCTIONS   Take medicines only as directed by your health care provider.  Use any implants or a pessary as directed by your health care provider.  Make any diet or lifestyle changes that are recommended by your health care provider. These might include:  Drinking less fluid or  drinking at different times of the day. If you need to urinate often during the night, you may need to stop drinking fluids early in the evening.  Cutting down on caffeine or alcohol. Both can make an overactive bladder worse. Caffeine is found in coffee, tea, and sodas.  Doing Kegel exercises to strengthen muscles.  Losing weight if you need to.  Eating a healthy and balanced diet to prevent constipation.  Keep a journal or log to track how much and when you drink and also when you feel the need to urinate. This will help your health care provider to monitor your condition. SEEK MEDICAL CARE IF:  Your symptoms do not get better after treatment.  Your pain and discomfort are getting worse.  You have more frequent urges to urinate.  You have a fever. SEEK IMMEDIATE MEDICAL CARE IF: You are not able to control your bladder at all.   This information is not intended to replace advice given to you by your health care provider. Make sure you discuss any questions you have with your health care provider.   Document Released: 08/26/2009 Document Revised: 11/20/2014 Document Reviewed: 03/25/2014 Elsevier Interactive Patient Education 2016 [employer]vier Inc.  

## 2016-04-25 NOTE — Progress Notes (Signed)
Contacted pt in regards to Urology referral to [facility] @ [phone] per pt request.  I informed her that her appt is scheduled for June 15 @ 0900 with Dr. Venia Minks.  Pt stated understanding with no further questions.

## 2016-04-27 LAB — URINE CULTURE
Colony Count: NO GROWTH
Organism ID, Bacteria: NO GROWTH

## 2017-01-12 DIAGNOSIS — G43009 Migraine without aura, not intractable, without status migrainosus: Secondary | ICD-10-CM | POA: Insufficient documentation

## 2017-02-07 ENCOUNTER — Ambulatory Visit (INDEPENDENT_AMBULATORY_CARE_PROVIDER_SITE_OTHER): Payer: BLUE CROSS/BLUE SHIELD | Admitting: Obstetrics & Gynecology

## 2017-02-07 ENCOUNTER — Encounter: Payer: Self-pay | Admitting: Obstetrics & Gynecology

## 2017-02-07 VITALS — BP 126/63 | HR 91 | Ht 66.0 in | Wt 218.3 lb

## 2017-02-07 DIAGNOSIS — N898 Other specified noninflammatory disorders of vagina: Secondary | ICD-10-CM | POA: Diagnosis not present

## 2017-02-07 DIAGNOSIS — Z113 Encounter for screening for infections with a predominantly sexual mode of transmission: Secondary | ICD-10-CM | POA: Diagnosis not present

## 2017-02-07 LAB — POCT URINALYSIS DIP (DEVICE)
Bilirubin Urine: NEGATIVE
GLUCOSE, UA: NEGATIVE mg/dL
Hgb urine dipstick: NEGATIVE
Ketones, ur: NEGATIVE mg/dL
Leukocytes, UA: NEGATIVE
Nitrite: NEGATIVE
PROTEIN: NEGATIVE mg/dL
Specific Gravity, Urine: 1.025 (ref 1.005–1.030)
UROBILINOGEN UA: 0.2 mg/dL (ref 0.0–1.0)
pH: 6 (ref 5.0–8.0)

## 2017-02-07 MED ORDER — ESTRADIOL 0.1 MG/GM VA CREA: TOPICAL_CREAM | VAGINAL | 2 refills | Status: DC

## 2017-02-07 NOTE — Progress Notes (Signed)
Patient states of having dryness and burning, not when urinating. Also patient states of having very dark urine and discharge.Patient wants to get checked for STD's.

## 2017-02-07 NOTE — Progress Notes (Signed)
Patient ID: [name], female   DOB: 1966-01-01, 51 y.o.   MRN: [id number]  Chief Complaint  Patient presents with  . Gynecologic Exam    HPI Gwendolyn Russell is a 51 y.o. female.  [id number] Patient's last menstrual period was 08/25/2015. s/p hysterectomy 1 year ago. Vaginal dryness and discharge and urinary odor which she thinks could be due to her vitamin supplements HPI  Past Medical History:  Diagnosis Date  . Abnormal Pap smear   . Anemia    Hx  . Anxiety   . Arthritis    hips, knees, lower back  . Chronic back pain   . Depression   . DVT of leg (deep venous thrombosis), left   . GERD (gastroesophageal reflux disease)   . Headache   . Hypertension    Hx weight loss surgery, no current meds  . Insomnia   . Obesity   . PONV (postoperative nausea and vomiting)   . 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 SVD (spontaneous vaginal delivery)    x 2  . Vaginal delivery 1985, 1988    Past Surgical History:  Procedure Laterality Date  . BUNIONECTOMY Bilateral   . CARPAL TUNNEL RELEASE Left   . COLPOSCOPY    . LAPAROSCOPIC GASTRIC SLEEVE RESECTION  10/11/15  . LASER ABLATION CONDYLOMA CERVICAL / VULVAR    . LEEP    . TONSILLECTOMY    . TUBAL LIGATION    . VAGINAL HYSTERECTOMY N/A 02/08/2016   Procedure: HYSTERECTOMY VAGINAL;  Surgeon: Woodroe Mode, MD;  Location: [facility];  Service: Gynecology;  Laterality: N/A;    Family History  Problem Relation Age of Onset  . Heart disease Maternal Grandmother   . Diabetes Father   . Heart disease Father   . Deep vein thrombosis Father   . Early death Mother   . Breast cancer Sister   . Diabetes Sister     Social History Social History  Substance Use Topics  . Smoking status: Never Smoker  . Smokeless tobacco: Never Used  . Alcohol use No    Allergies  Allergen Reactions  . Cephalosporins Hives  . Clindamycin Itching  . Tdap [Diphth-Acell Pertussis-Tetanus]   .  Sulfa Antibiotics Rash  . Tape Rash    Adhesive tape    Current Outpatient Prescriptions  Medication Sig Dispense Refill  . ALPRAZolam (XANAX) 1 MG tablet Take 1 mg by mouth 3 (three) times daily.    . Biotin 5000 MCG CAPS Take 1 capsule by mouth daily.     . calcium citrate (CALCITRATE - DOSED IN MG ELEMENTAL CALCIUM) 950 MG tablet Take by mouth.    . Calcium Polycarbophil (FIBERCON PO) Take 1 tablet by mouth daily.    . cetirizine (ZYRTEC) 10 MG tablet Take by mouth.    . Cholecalciferol (VITAMIN D3) 2000 units TABS Take 2 tablets by mouth daily.     Marland Kitchen CINNAMON PO Take 2 capsules by mouth daily.    . cyclobenzaprine (FLEXERIL) 10 MG tablet Take 10 mg by mouth at bedtime.    . diclofenac sodium (VOLTAREN) 1 % GEL 4 (four) times a day as needed. for pain    . Docusate Sodium (COLACE PO) Take 2 tablets by mouth daily.    Marland Kitchen esomeprazole (NEXIUM) 40 MG capsule     . gabapentin (NEURONTIN) 300 MG capsule Take 300 mg by mouth 3 (three) times daily.    Marland Kitchen  metoCLOPramide (REGLAN) 10 MG tablet Take 10 mg by mouth every 6 (six) hours as needed for nausea.     . norelgestromin-ethinyl estradiol (ORTHO EVRA) 150-35 MCG/24HR transdermal patch Place 1 patch onto the skin once a week. 3 patch 12  . nortriptyline (PAMELOR) 25 MG capsule Take 25 mg by mouth at bedtime.    Marland Kitchen nystatin (MYCOSTATIN) powder Apply 1 Bottle topically as needed (rash).     . ondansetron (ZOFRAN) 4 MG tablet Take 4 mg by mouth daily. Reported on 03/20/2016    . oxybutynin (DITROPAN) 5 MG tablet Take 1 tablet (5 mg total) by mouth 2 (two) times daily. 60 tablet 2  . oxyCODONE-acetaminophen (PERCOCET/ROXICET) 5-325 MG tablet Take 1-2 tablets by mouth every 4 (four) hours as needed for moderate pain or severe pain. 20 tablet 0  . Prenatal Vit-Fe Fumarate-FA (PRENATAL MULTIVITAMIN) TABS tablet Take 1 tablet by mouth daily at 12 noon.    . Probiotic Product (PROBIOTIC DAILY PO) Take 1 tablet by mouth daily.    Marland Kitchen topiramate (TOPAMAX) 50  MG tablet Take 50 mg by mouth 3 (three) times daily.     Marland Kitchen triamcinolone cream (KENALOG) 0.1 % Apply 1 application topically as needed (rash).     . zolpidem (AMBIEN) 10 MG tablet Take 10 mg by mouth at bedtime.    Marland Kitchen estradiol (ESTRACE VAGINAL) 0.1 MG/GM vaginal cream 1/2 applicator per vagina daily for 2 weeks then 2 times a week for a total of 8 weeks 42.5 g 2   No current facility-administered medications for this visit.     Review of Systems Review of Systems  Constitutional: Negative.   Gastrointestinal: Negative.   Genitourinary: Positive for dyspareunia and vaginal discharge. Negative for dysuria, pelvic pain and vaginal bleeding.    Blood pressure 126/63, pulse 91, height 5\' 6"  (1.676 m), weight 218 lb 4.8 oz (99 kg), last menstrual period 08/25/2015.  Physical Exam Physical Exam  Constitutional: She appears well-developed. No distress.  Cardiovascular: Normal rate.   Genitourinary:  Genitourinary Comments: Minimal atrophy, scant discharge, cuff intact no lesions  Psychiatric: She has a normal mood and affect. Her behavior is normal.    Data Reviewed Path report   Assessment    Possible vaginal atrophy    Plan    Estrace cream for 8 weeks STD testing done       Emeterio Reeve 02/07/2017, 3:05 PM

## 2017-02-08 LAB — CERVICOVAGINAL ANCILLARY ONLY
BACTERIAL VAGINITIS: NEGATIVE
CANDIDA VAGINITIS: NEGATIVE
CHLAMYDIA, DNA PROBE: NEGATIVE
Neisseria Gonorrhea: NEGATIVE
Trichomonas: NEGATIVE

## 2017-02-16 ENCOUNTER — Telehealth: Payer: Self-pay | Admitting: *Deleted

## 2017-02-16 NOTE — Telephone Encounter (Signed)
Called pt and informed her of negative test results from recent visit. She has no vaginal infections requiring treatment.  Pt voiced understanding.

## 2017-04-30 ENCOUNTER — Other Ambulatory Visit: Payer: Self-pay | Admitting: Obstetrics & Gynecology

## 2017-04-30 DIAGNOSIS — Z3045 Encounter for surveillance of transdermal patch hormonal contraceptive device: Secondary | ICD-10-CM

## 2017-05-21 ENCOUNTER — Telehealth: Payer: Self-pay | Admitting: *Deleted

## 2017-05-21 NOTE — Telephone Encounter (Signed)
I called Gwendolyn Russell back and left a message I got  Her message and am calling back. I also stated it looks like a refill was sent in, so if there is still an issue , please call us back.

## 2017-05-21 NOTE — Telephone Encounter (Signed)
Gwendolyn Russell called 7/6./18 am and left a message she called about a week ago. States she is a patient of Dr. Roselie Awkward and called about her xulane patch. States she wears it continuously and ran out. States [facility] in [place] states they have faxed [place] 3 times . Would like someone to call  Her.

## 2017-05-23 NOTE — Telephone Encounter (Signed)
I called Gwendolyn Russell again and heard a message" the number you dialed is not in service", unable to leave a message.

## 2017-08-20 ENCOUNTER — Telehealth: Payer: Self-pay | Admitting: *Deleted

## 2017-08-20 DIAGNOSIS — N898 Other specified noninflammatory disorders of vagina: Secondary | ICD-10-CM

## 2017-08-20 MED ORDER — FLUCONAZOLE 150 MG PO TABS: 150.0000 mg | ORAL_TABLET | Freq: Once | ORAL | 0 refills | Status: AC

## 2017-08-20 NOTE — Telephone Encounter (Signed)
Gwendolyn Russell left a voicemessage this am stating she is a patient of Dr. Roselie Awkward and she has had 2 rounds of amoxicillin from her dentist because of infection in her teeth where bridge removed and has had a root canal and will be getting another. States thinks she has a yeast infection because she has itching and burning and would like diflucan called into her [address] [place]. States she can't be called until after 12:30 because she is taking exams.   Order sent to [facility]

## 2017-08-21 NOTE — Telephone Encounter (Signed)
Called patient stating I am returning her phone call. Told patient Rx has already been sent in for diflucan that she can pick up. Patient verbalized understanding and states she has been on so many antibiotics lately and they always give her a yeast infection. Asked patient if she was taking a probiotic and she states yes. Told patient she may want to slightly increase dose during antibiotic therapy since all her body's bacteria is being wiped out. Patient verbalized understanding. Told patient to call back in 3 days if she isn't feeling better. Patient verbalized understanding and had no questions

## 2017-10-08 ENCOUNTER — Encounter (HOSPITAL_COMMUNITY): Payer: Self-pay

## 2018-03-14 ENCOUNTER — Other Ambulatory Visit: Payer: Self-pay | Admitting: Obstetrics & Gynecology

## 2018-03-14 DIAGNOSIS — Z3045 Encounter for surveillance of transdermal patch hormonal contraceptive device: Secondary | ICD-10-CM

## 2019-04-11 ENCOUNTER — Other Ambulatory Visit: Payer: Self-pay | Admitting: Obstetrics & Gynecology

## 2019-04-11 DIAGNOSIS — Z3045 Encounter for surveillance of transdermal patch hormonal contraceptive device: Secondary | ICD-10-CM

## 2019-04-28 ENCOUNTER — Telehealth (INDEPENDENT_AMBULATORY_CARE_PROVIDER_SITE_OTHER): Payer: BLUE CROSS/BLUE SHIELD | Admitting: Lactation Services

## 2019-04-28 DIAGNOSIS — Z3045 Encounter for surveillance of transdermal patch hormonal contraceptive device: Secondary | ICD-10-CM

## 2019-04-28 NOTE — Telephone Encounter (Signed)
Called pt to inform her that her prescription has been sent to her pharmacy today. Pt voiced understanding.

## 2019-05-03 ENCOUNTER — Other Ambulatory Visit: Payer: Self-pay | Admitting: Obstetrics & Gynecology

## 2019-05-03 DIAGNOSIS — Z3045 Encounter for surveillance of transdermal patch hormonal contraceptive device: Secondary | ICD-10-CM

## 2019-05-05 ENCOUNTER — Telehealth: Payer: Self-pay

## 2019-05-05 NOTE — Telephone Encounter (Signed)
Pt called stating that she received a call from a nurse on Friday telling her that her patches was refilled but when she went to pharmacy they did not have her Rx.

## 2019-05-06 NOTE — Telephone Encounter (Signed)
Zendaya called again and left voicemail she is still trying to get her xulane patch. States was told by someone it was sent to pharmacy but [facility] on [address] states they never got it and have sent multiple requests.

## 2019-05-06 NOTE — Telephone Encounter (Signed)
I verified order has been placed by Dr. Roselie Awkward on 04/28/19 . I called [facility] [employer] and they had not received it.  I verified order by phone.   I called [facility] and left a message we received her message and have sent in the prescription- should be ready later today- call us if issues. Also left message that we have not seen you in 2 years- please call to make appt - we cannot approve further refills without appt. Call if questions.   Linda,RN

## 2019-06-19 ENCOUNTER — Encounter: Payer: BLUE CROSS/BLUE SHIELD | Admitting: Obstetrics & Gynecology

## 2019-06-19 ENCOUNTER — Encounter: Payer: Self-pay | Admitting: Obstetrics & Gynecology

## 2019-06-19 DIAGNOSIS — Z3045 Encounter for surveillance of transdermal patch hormonal contraceptive device: Secondary | ICD-10-CM

## 2019-06-19 MED ORDER — XULANE 150-35 MCG/24HR TD PTWK: MEDICATED_PATCH | TRANSDERMAL | 11 refills | Status: DC

## 2019-06-19 NOTE — Progress Notes (Signed)
Refill for Xulane patch sent to the pharmacy. She may reschedule her visit if needed Woodroe Mode, MD

## 2019-07-30 ENCOUNTER — Other Ambulatory Visit: Payer: Self-pay

## 2019-07-30 ENCOUNTER — Encounter: Payer: Self-pay | Admitting: Obstetrics & Gynecology

## 2019-07-30 ENCOUNTER — Ambulatory Visit (INDEPENDENT_AMBULATORY_CARE_PROVIDER_SITE_OTHER): Payer: BC Managed Care – PPO | Admitting: Obstetrics & Gynecology

## 2019-07-30 VITALS — BP 112/79 | HR 121 | Wt 251.3 lb

## 2019-07-30 DIAGNOSIS — Z3045 Encounter for surveillance of transdermal patch hormonal contraceptive device: Secondary | ICD-10-CM

## 2019-07-30 DIAGNOSIS — G43909 Migraine, unspecified, not intractable, without status migrainosus: Secondary | ICD-10-CM

## 2019-07-30 DIAGNOSIS — N951 Menopausal and female climacteric states: Secondary | ICD-10-CM | POA: Diagnosis not present

## 2019-07-30 MED ORDER — ESTRADIOL 0.1 MG/24HR TD PTWK: 0.1000 mg | MEDICATED_PATCH | TRANSDERMAL | 12 refills | Status: DC

## 2019-07-30 NOTE — Patient Instructions (Signed)
Menopause and Hormone Replacement Therapy Menopause is a normal time of life when menstrual periods stop completely and the ovaries stop producing the female hormones estrogen and progesterone. This lack of hormones can affect your health and cause undesirable symptoms. Hormone replacement therapy (HRT) can relieve some of those symptoms. What is hormone replacement therapy? HRT is the use of artificial (synthetic) hormones to replace hormones that your body has stopped producing because you have reached menopause. What are my options for HRT?  HRT may consist of the synthetic hormones estrogen and progestin, or it may consist of only estrogen (estrogen-only therapy). You and your health care provider will decide which form of HRT is best for you. If you choose to be on HRT and you have a uterus, estrogen and progestin are usually prescribed. Estrogen-only therapy is used for women who do not have a uterus. Possible options for taking HRT include:  Pills.  Patches.  Gels.  Sprays.  Vaginal cream.  Vaginal rings.  Vaginal inserts. The amount of hormone(s) that you take and how long you take the hormone(s) varies according to your health. It is important to:  Begin HRT with the lowest possible dosage.  Stop HRT as soon as your health care provider tells you to stop.  Work with your health care provider so that you feel informed and comfortable with your decisions. What are the benefits of HRT? HRT can reduce the frequency and severity of menopausal symptoms. Benefits of HRT vary according to the kind of symptoms that you have, how severe they are, and your overall health. HRT may help to improve the following symptoms of menopause:  Hot flashes and night sweats. These are sudden feelings of heat that spread over the face and body. The skin may turn red, like a blush. Night sweats are hot flashes that happen while you are sleeping or trying to sleep.  Bone loss (osteoporosis). The  body loses calcium more quickly after menopause, causing the bones to become weaker. This can increase the risk for bone breaks (fractures).  Vaginal dryness. The lining of the vagina can become thin and dry, which can cause pain during sex or cause infection, burning, or itching.  Urinary tract infections.  Urinary incontinence. This is the inability to control when you pass urine.  Irritability.  Short-term memory problems. What are the risks of HRT? Risks of HRT vary depending on your individual health and medical history. Risks of HRT also depend on whether you receive both estrogen and progestin or you receive estrogen only. HRT may increase the risk of:  Spotting. This is when a small amount of blood leaks from the vagina unexpectedly.  Endometrial cancer. This cancer is in the lining of the uterus (endometrium).  Breast cancer.  Increased density of breast tissue. This can make it harder to find breast cancer on a breast X-ray (mammogram).  Stroke.  Heart disease.  Blood clots.  Gallbladder disease.  Liver disease. Risks of HRT can increase if you have any of the following conditions:  Endometrial cancer.  Liver disease.  Heart disease.  Breast cancer.  History of blood clots.  History of stroke. Follow these instructions at home:  Take over-the-counter and prescription medicines only as told by your health care provider.  Get mammograms, pelvic exams, and medical checkups as often as told by your health care provider.  Have Pap tests done as often as told by your health care provider. A Pap test is sometimes called a Pap smear. It   is a screening test that is used to check for signs of cancer of the cervix and vagina. A Pap test can also identify the presence of infection or precancerous changes. Pap tests may be done: ? Every 3 years, starting at age 21. ? Every 5 years, starting after age 30, in combination with testing for human papillomavirus (HPV). ?  More often or less often depending on other medical conditions you have, your age, and other risk factors.  It is up to you to get the results of your Pap test. Ask your health care provider, or the department that is doing the test, when your results will be ready.  Keep all follow-up visits as told by your health care provider. This is important. Contact a health care provider if you have:  Pain or swelling in your legs.  Shortness of breath.  Chest pain.  Lumps or changes in your breasts or armpits.  Slurred speech.  Pain, burning, or bleeding when you urinate.  Unusual vaginal bleeding.  Dizziness or headaches.  Weakness or numbness in any part of your arms or legs.  Pain in your abdomen. Summary  Menopause is a normal time of life when menstrual periods stop completely and the ovaries stop producing the female hormones estrogen and progesterone.  Hormone replacement therapy (HRT) can relieve some of the symptoms of menopause.  HRT can reduce the frequency and severity of menopausal symptoms.  Risks of HRT vary depending on your individual health and medical history. This information is not intended to replace advice given to you by your health care provider. Make sure you discuss any questions you have with your health care provider. Document Released: 07/29/2003 Document Revised: 07/02/2018 Document Reviewed: 07/02/2018 Elsevier Patient Education  2020 Elsevier Inc.  

## 2019-07-30 NOTE — Progress Notes (Signed)
Patient ID: Gwendolyn Russell, female   DOB: [birth year], 53 y.o.   MRN: [id number]  Chief Complaint  Patient presents with  . Consult  menopausal symptoms  HPI Gwendolyn Russell is a 53 y.o. female.  [id number] Patient's last menstrual period was 08/25/2015. S/p TVH 2017. She has used [employer] and notices more VMS and weight gain and various other somatic sx. I prescribed Climara postop but she continued to request refills for [facility] after that. Her PCP suggested she come here to discuss management. Breast f/u is with her plastic surgeon. HPI  Past Medical History:  Diagnosis Date  . Abnormal Pap smear   . Anemia    Hx  . Anxiety   . Arthritis    hips, knees, lower back  . Chronic back pain   . Depression   . DVT of leg (deep venous thrombosis), left   . GERD (gastroesophageal reflux disease)   . Headache   . Hypertension    Hx weight loss surgery, no current meds  . Insomnia   . Obesity   . PONV (postoperative nausea and vomiting)   . 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 SVD (spontaneous vaginal delivery)    x 2  . Vaginal delivery 1985, 1988    Past Surgical History:  Procedure Laterality Date  . BUNIONECTOMY Bilateral   . CARPAL TUNNEL RELEASE Left   . COLPOSCOPY    . LAPAROSCOPIC GASTRIC SLEEVE RESECTION  10/11/15  . LASER ABLATION CONDYLOMA CERVICAL / VULVAR    . LEEP    . TONSILLECTOMY    . TUBAL LIGATION    . VAGINAL HYSTERECTOMY N/A 02/08/2016   Procedure: HYSTERECTOMY VAGINAL;  Surgeon: Woodroe Mode, MD;  Location: [facility];  Service: Gynecology;  Laterality: N/A;    Family History  Problem Relation Age of Onset  . Heart disease Maternal Grandmother   . Diabetes Father   . Heart disease Father   . Deep vein thrombosis Father   . Early death Mother   . Breast cancer Sister   . Diabetes Sister     Social History Social History   Tobacco Use  . Smoking status: Never Smoker  . Smokeless tobacco: Never  Used  Substance Use Topics  . Alcohol use: No  . Drug use: No    Allergies  Allergen Reactions  . Cephalosporins Hives  . Clindamycin Itching  . Tdap [Tetanus-Diphth-Acell Pertussis]   . Sulfa Antibiotics Rash  . Tape Rash    Adhesive tape    Current Outpatient Medications  Medication Sig Dispense Refill  . ALPRAZolam (XANAX) 1 MG tablet Take 1 mg by mouth 3 (three) times daily.    . Biotin 5000 MCG CAPS Take 1 capsule by mouth daily.     . calcium citrate (CALCITRATE - DOSED IN MG ELEMENTAL CALCIUM) 950 MG tablet Take by mouth.    . cetirizine (ZYRTEC) 10 MG tablet Take by mouth.    . cyclobenzaprine (FLEXERIL) 10 MG tablet Take 10 mg by mouth at bedtime.    . diclofenac sodium (VOLTAREN) 1 % GEL 4 (four) times a day as needed. for pain    . Docusate Sodium (COLACE PO) Take 2 tablets by mouth daily.    . famotidine (PEPCID) 20 MG tablet Take 20 mg by mouth 2 (two) times daily.    Marland Kitchen gabapentin (NEURONTIN) 300 MG capsule Take 300 mg by mouth 3 (three) times daily.    Marland Kitchen  norelgestromin-ethinyl estradiol (XULANE) 150-35 MCG/24HR transdermal patch APPLY ONE PATCH TOPICALLY TO SKIN ONCE A WEEK. REMOVE OLD PATCH. 3 patch 11  . nortriptyline (PAMELOR) 25 MG capsule Take 25 mg by mouth at bedtime.    . pantoprazole sodium (PROTONIX) 40 mg/20 mL PACK Place into feeding tube daily.    . prazosin (MINIPRESS) 2 MG capsule Take 4 mg by mouth at bedtime.    . Prenatal Vit-Fe Fumarate-FA (PRENATAL MULTIVITAMIN) TABS tablet Take 1 tablet by mouth daily at 12 noon.    . Probiotic Product (PROBIOTIC DAILY PO) Take 1 tablet by mouth daily.    Marland Kitchen zolpidem (AMBIEN) 10 MG tablet Take 10 mg by mouth at bedtime.    . Calcium Polycarbophil (FIBERCON PO) Take 1 tablet by mouth daily.    . Cholecalciferol (VITAMIN D3) 2000 units TABS Take 2 tablets by mouth daily.     Marland Kitchen CINNAMON PO Take 2 capsules by mouth daily.    Marland Kitchen esomeprazole (NEXIUM) 40 MG capsule     . estradiol (CLIMARA) 0.1 mg/24hr patch Place 1  patch (0.1 mg total) onto the skin once a week. 4 patch 12  . estradiol (ESTRACE VAGINAL) 0.1 MG/GM vaginal cream 1/2 applicator per vagina daily for 2 weeks then 2 times a week for a total of 8 weeks 42.5 g 2  . metoCLOPramide (REGLAN) 10 MG tablet Take 10 mg by mouth every 6 (six) hours as needed for nausea.     Marland Kitchen oxybutynin (DITROPAN) 5 MG tablet Take 1 tablet (5 mg total) by mouth 2 (two) times daily. 60 tablet 2  . topiramate (TOPAMAX) 50 MG tablet Take 50 mg by mouth 3 (three) times daily.     Marland Kitchen triamcinolone cream (KENALOG) 0.1 % Apply 1 application topically as needed (rash).      No current facility-administered medications for this visit.     Review of Systems Review of Systems  Constitutional: Positive for unexpected weight change (gain).  Endocrine:       Hot flushes  Genitourinary: Negative.   Musculoskeletal: Positive for arthralgias.  Neurological: Positive for headaches.    Blood pressure 112/79, pulse (!) 121, weight 251 lb 4.8 oz (114 kg), last menstrual period 08/25/2015.  Physical Exam Physical Exam Constitutional:      Appearance: Normal appearance. She is obese. She is not ill-appearing.  Pulmonary:     Effort: Pulmonary effort is normal.  Abdominal:     General: There is no distension.  Musculoskeletal: Normal range of motion.  Psychiatric:        Mood and Affect: Mood normal.        Behavior: Behavior normal.     Data Reviewed Office notes  Assessment VMS associated with menopausal ovarian failure Migraine headache Weight gain  Plan Climara 0.1 mg patches FSH today F/U with neurology re migraine    Emeterio Reeve 07/30/2019, 2:57 PM

## 2019-07-30 NOTE — Progress Notes (Signed)
Pt is here for refill of xulane patches. Pt reports hyst in 2017. Pt is concerned she is becoming menopausal. Last pap 2015, normal.

## 2019-07-31 LAB — FOLLICLE STIMULATING HORMONE: FSH: 16 m[IU]/mL

## 2019-08-13 ENCOUNTER — Telehealth: Payer: BC Managed Care – PPO | Admitting: Obstetrics & Gynecology

## 2019-08-14 ENCOUNTER — Telehealth: Payer: BC Managed Care – PPO | Admitting: Obstetrics & Gynecology

## 2019-09-02 ENCOUNTER — Other Ambulatory Visit: Payer: Self-pay | Admitting: Obstetrics and Gynecology

## 2019-09-02 MED ORDER — ESTRADIOL 0.5 MG PO TABS: 0.5000 mg | ORAL_TABLET | Freq: Every day | ORAL | 3 refills | Status: DC

## 2019-11-26 ENCOUNTER — Other Ambulatory Visit: Payer: Self-pay | Admitting: Obstetrics and Gynecology

## 2019-12-04 ENCOUNTER — Other Ambulatory Visit: Payer: Self-pay

## 2019-12-04 MED ORDER — ESTRADIOL 0.5 MG PO TABS: 0.5000 mg | ORAL_TABLET | Freq: Every day | ORAL | 3 refills | Status: DC

## 2020-03-26 ENCOUNTER — Other Ambulatory Visit: Payer: Self-pay | Admitting: Obstetrics and Gynecology

## 2020-03-31 ENCOUNTER — Other Ambulatory Visit: Payer: Self-pay | Admitting: Obstetrics & Gynecology

## 2020-03-31 DIAGNOSIS — N951 Menopausal and female climacteric states: Secondary | ICD-10-CM

## 2020-03-31 MED ORDER — ESTRADIOL 1 MG PO TABS: 1.0000 mg | ORAL_TABLET | Freq: Every day | ORAL | 6 refills | Status: DC

## 2020-03-31 NOTE — Progress Notes (Signed)
Estradiol 1 mg po daily prescribed

## 2020-06-01 ENCOUNTER — Encounter (HOSPITAL_COMMUNITY): Payer: Self-pay

## 2020-06-01 ENCOUNTER — Ambulatory Visit (HOSPITAL_COMMUNITY)
Admission: EM | Admit: 2020-06-01 | Discharge: 2020-06-01 | Disposition: A | Discharge status: Home / Self Care | Payer: BC Managed Care – PPO | Attending: Family Medicine | Admitting: Family Medicine

## 2020-06-01 ENCOUNTER — Other Ambulatory Visit: Payer: Self-pay

## 2020-06-01 DIAGNOSIS — K219 Gastro-esophageal reflux disease without esophagitis: Secondary | ICD-10-CM | POA: Diagnosis not present

## 2020-06-01 DIAGNOSIS — M1990 Unspecified osteoarthritis, unspecified site: Secondary | ICD-10-CM | POA: Diagnosis not present

## 2020-06-01 DIAGNOSIS — R05 Cough: Secondary | ICD-10-CM | POA: Diagnosis not present

## 2020-06-01 DIAGNOSIS — Z79899 Other long term (current) drug therapy: Secondary | ICD-10-CM | POA: Diagnosis not present

## 2020-06-01 DIAGNOSIS — J22 Unspecified acute lower respiratory infection: Secondary | ICD-10-CM | POA: Insufficient documentation

## 2020-06-01 DIAGNOSIS — Z6839 Body mass index (BMI) 39.0-39.9, adult: Secondary | ICD-10-CM | POA: Insufficient documentation

## 2020-06-01 DIAGNOSIS — F419 Anxiety disorder, unspecified: Secondary | ICD-10-CM | POA: Diagnosis not present

## 2020-06-01 DIAGNOSIS — G43909 Migraine, unspecified, not intractable, without status migrainosus: Secondary | ICD-10-CM | POA: Diagnosis not present

## 2020-06-01 DIAGNOSIS — I10 Essential (primary) hypertension: Secondary | ICD-10-CM | POA: Diagnosis not present

## 2020-06-01 DIAGNOSIS — R439 Unspecified disturbances of smell and taste: Secondary | ICD-10-CM | POA: Insufficient documentation

## 2020-06-01 DIAGNOSIS — Z86718 Personal history of other venous thrombosis and embolism: Secondary | ICD-10-CM | POA: Diagnosis not present

## 2020-06-01 DIAGNOSIS — F329 Major depressive disorder, single episode, unspecified: Secondary | ICD-10-CM | POA: Diagnosis not present

## 2020-06-01 DIAGNOSIS — Z20822 Contact with and (suspected) exposure to covid-19: Secondary | ICD-10-CM | POA: Diagnosis not present

## 2020-06-01 DIAGNOSIS — E669 Obesity, unspecified: Secondary | ICD-10-CM | POA: Diagnosis not present

## 2020-06-01 MED ORDER — FLUCONAZOLE 150 MG PO TABS: 150.0000 mg | ORAL_TABLET | Freq: Once | ORAL | 0 refills | Status: AC

## 2020-06-01 MED ORDER — DM-GUAIFENESIN ER 30-600 MG PO TB12
1.0000 | ORAL_TABLET | Freq: Two times a day (BID) | ORAL | 0 refills | Status: DC
Start: 2020-06-01 — End: 2021-02-04

## 2020-06-01 MED ORDER — DOXYCYCLINE HYCLATE 100 MG PO CAPS
100.0000 mg | ORAL_CAPSULE | Freq: Two times a day (BID) | ORAL | 0 refills | Status: AC
Start: 2020-06-01 — End: 2020-06-08

## 2020-06-01 MED ORDER — BENZONATATE 200 MG PO CAPS: 200.0000 mg | ORAL_CAPSULE | Freq: Three times a day (TID) | ORAL | 0 refills | Status: AC | PRN

## 2020-06-01 NOTE — Discharge Instructions (Signed)
Covid test pending, monitor my chart for results Continue Flonase 1 to 2 spray in each nostril daily Mucinex DM twice daily for further congestion/cough relief Tessalon/benzonatate every 8 hours as needed for cough Doxycycline twice daily for the next week to cover infection in sinuses/lungs Diflucan prescribed if needed Rest and drink plenty of fluids  Please follow-up if any symptoms not improving or worsening despite use of the above

## 2020-06-01 NOTE — ED Provider Notes (Signed)
[facility]    CSN: [id number] Arrival date & time: 06/01/20  1806      History   Chief Complaint Chief Complaint  Patient presents with  . loss of taste, dry throat    HPI Gwendolyn Russell is a 54 y.o. female history of hypertension, GERD, presenting today for evaluation of throat irritation and cough.  Patient reports for approximately 1 week she has had a cough as well as dryness throughout her sinuses throat and ears.  She has had an occasionally productive cough with thick brown mucus.  She has a bad taste in her throat which she describes as "infection".  She found out today she has had exposure to Covid within the past 2 weeks.  Reports recent loss in family and has been avoiding large gatherings with people that recently tested positive.  She denies fevers.  Denies chest pain or shortness of breath.  Denies GI symptoms.  HPI  Past Medical History:  Diagnosis Date  . Abnormal Pap smear   . Anemia    Hx  . Anxiety   . Arthritis    hips, knees, lower back  . Chronic back pain   . Depression   . DVT of leg (deep venous thrombosis), left   . GERD (gastroesophageal reflux disease)   . Headache   . Hypertension    Hx weight loss surgery, no current meds  . Insomnia   . Obesity   . PONV (postoperative nausea and vomiting)   . 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 SVD (spontaneous vaginal delivery)    x 2  . Vaginal delivery 1985, 1988    Patient Active Problem List   Diagnosis Date Noted  . Migraine 07/30/2019  . Obesity 11/15/2012    Past Surgical History:  Procedure Laterality Date  . BUNIONECTOMY Bilateral   . CARPAL TUNNEL RELEASE Left   . COLPOSCOPY    . LAPAROSCOPIC GASTRIC SLEEVE RESECTION  10/11/15  . LASER ABLATION CONDYLOMA CERVICAL / VULVAR    . LEEP    . MENISCUS REPAIR Right   . TONSILLECTOMY    . TUBAL LIGATION    . VAGINAL HYSTERECTOMY N/A 02/08/2016   Procedure: HYSTERECTOMY VAGINAL;   Surgeon: Woodroe Mode, MD;  Location: [facility];  Service: Gynecology;  Laterality: N/A;    OB History    Gravida  2   Para  2   Term  2   Preterm      AB      Living  2     SAB      TAB      Ectopic      Multiple      Live Births               Home Medications    Prior to Admission medications   Medication Sig Start Date End Date Taking? Authorizing Provider  hydrochlorothiazide (HYDRODIURIL) 25 MG tablet Take 25 mg by mouth daily.   Yes [provider]  ALPRAZolam Duanne Moron) 1 MG tablet Take 1 mg by mouth 3 (three) times daily.    [provider]  benzonatate (TESSALON) 200 MG capsule Take 1 capsule (200 mg total) by mouth 3 (three) times daily as needed for up to 7 days for cough. 06/01/20 06/08/20  Carlisle Torgeson C, PA-C  Biotin 5000 MCG CAPS Take 1 capsule by mouth daily.  10/25/15   [provider]  calcium citrate (CALCITRATE - DOSED IN MG ELEMENTAL CALCIUM) 950 MG tablet Take by mouth.    [provider]  Calcium Polycarbophil (FIBERCON PO) Take 1 tablet by mouth daily.    [provider]  cetirizine (ZYRTEC) 10 MG tablet Take by mouth.    [provider]  cyclobenzaprine (FLEXERIL) 10 MG tablet Take 10 mg by mouth at bedtime.    [provider]  dextromethorphan-guaiFENesin (MUCINEX DM) 30-600 MG 12hr tablet Take 1 tablet by mouth 2 (two) times daily. 06/01/20   Ameliah Baskins C, PA-C  diclofenac sodium (VOLTAREN) 1 % GEL 4 (four) times a day as needed. for pain 09/02/15   [provider]  Docusate Sodium (COLACE PO) Take 2 tablets by mouth daily.    [provider]  doxycycline (VIBRAMYCIN) 100 MG capsule Take 1 capsule (100 mg total) by mouth 2 (two) times daily for 7 days. 06/01/20 06/08/20  Deklyn Gibbon C, PA-C  esomeprazole (NEXIUM) 40 MG capsule  11/25/16   [provider]  estradiol (ESTRACE) 1 MG tablet Take 1 tablet (1 mg total) by mouth daily. 03/31/20   Woodroe Mode, MD  famotidine (PEPCID) 20 MG tablet Take 20 mg by mouth 2 (two) times daily.    [provider]  fluconazole (DIFLUCAN) 150 MG tablet Take 1 tablet (150 mg total) by mouth once for 1 dose. 06/01/20 06/01/20  Tamee Battin C, PA-C  gabapentin (NEURONTIN) 300 MG capsule Take 300 mg by mouth 3 (three) times daily.    [provider]  metoCLOPramide (REGLAN) 10 MG tablet Take 10 mg by mouth every 6 (six) hours as needed for nausea.  10/05/15   [provider]  pantoprazole sodium (PROTONIX) 40 mg/20 mL PACK Place into feeding tube daily.    [provider]  prazosin (MINIPRESS) 2 MG capsule Take 4 mg by mouth at bedtime.    [provider]  Prenatal Vit-Fe Fumarate-FA (PRENATAL MULTIVITAMIN) TABS tablet Take 1 tablet by mouth daily at 12 noon.    [provider]  Probiotic Product (PROBIOTIC DAILY PO) Take 1 tablet by mouth daily.    [provider]  triamcinolone cream (KENALOG) 0.1 % Apply 1 application topically as needed (rash).  05/30/14   [provider]  nortriptyline (PAMELOR) 25 MG capsule Take 100 mg by mouth at bedtime.   06/01/20  [provider]  topiramate (TOPAMAX) 50 MG tablet Take 50 mg by mouth 3 (three) times daily.   06/01/20  [provider]  [name] 150-35 MCG/24HR transdermal patch APPLY ONE PATCH TOPICALLY TO SKIN ONCE A WEEK. REMOVE OLD PATCH. 04/28/19   Woodroe Mode, MD    Family History Family History  Problem Relation Age of Onset  . Heart disease Maternal Grandmother   . Diabetes Father   . Heart disease Father   . Deep vein thrombosis Father   . Early death Mother   . Breast cancer Sister   . Diabetes Sister     Social History Social History   Tobacco Use  . Smoking status: Never Smoker  . Smokeless tobacco: Never Used  Substance Use Topics  . Alcohol use: No  . Drug use: No     Allergies   Cephalosporins, Clindamycin, Tdap [tetanus-diphth-acell  pertussis], Sulfa antibiotics, and Tape   Review of Systems Review of Systems  Constitutional: Positive for chills and fatigue. Negative for activity change, appetite change and fever.  HENT: Positive for congestion and sinus pressure. Negative for  ear pain, rhinorrhea, sore throat and trouble swallowing.   Eyes: Negative for discharge and redness.  Respiratory: Positive for cough. Negative for chest tightness and shortness of breath.   Cardiovascular: Negative for chest pain.  Gastrointestinal: Negative for abdominal pain, diarrhea, nausea and vomiting.  Musculoskeletal: Negative for myalgias.  Skin: Negative for rash.  Neurological: Negative for dizziness, light-headedness and headaches.     Physical Exam Triage Vital Signs ED Triage Vitals  Enc Vitals Group     BP 06/01/20 1922 (!) 141/80     Pulse Rate 06/01/20 1922 (!) 106     Resp 06/01/20 1922 16     Temp 06/01/20 1922 98.4 F (36.9 C)     Temp Source 06/01/20 1922 Oral     SpO2 06/01/20 1922 97 %     Weight 06/01/20 1923 245 lb (111.1 kg)     Height 06/01/20 1923 5\' 6"  (1.676 m)     Head Circumference --      Peak Flow --      Pain Score 06/01/20 1922 7     Pain Loc --      Pain Edu? --      Excl. in [place]? --    No data found.  Updated Vital Signs BP (!) 141/80   Pulse (!) 106   Temp 98.4 F (36.9 C) (Oral)   Resp 16   Ht 5\' 6"  (1.676 m)   Wt 245 lb (111.1 kg)   LMP 08/25/2015   SpO2 97%   BMI 39.54 kg/m   Visual Acuity Right Eye Distance:   Left Eye Distance:   Bilateral Distance:    Right Eye Near:   Left Eye Near:    Bilateral Near:     Physical Exam Vitals and nursing note reviewed.  Constitutional:      Appearance: She is well-developed.     Comments: No acute distress  HENT:     Head: Normocephalic and atraumatic.     Ears:     Comments: Bilateral ears without tenderness to palpation of external auricle, tragus and mastoid, EAC's without erythema or swelling, TM's with good bony  landmarks and cone of light. Non erythematous.     Nose: Nose normal.     Mouth/Throat:     Comments: Oral mucosa pink and moist, no tonsillar enlargement or exudate. Posterior pharynx patent and nonerythematous, no uvula deviation or swelling. Normal phonation. Eyes:     Conjunctiva/sclera: Conjunctivae normal.  Cardiovascular:     Rate and Rhythm: Normal rate.  Pulmonary:     Effort: Pulmonary effort is normal. No respiratory distress.     Comments: Breathing comfortably at rest, CTABL, no wheezing, rales or other adventitious sounds auscultated Abdominal:     General: There is no distension.  Musculoskeletal:        General: Normal range of motion.     Cervical back: Neck supple.  Skin:    General: Skin is warm and dry.  Neurological:     Mental Status: She is alert and oriented to person, place, and time.      UC Treatments / Results  Labs (all labs ordered are listed, but only abnormal results are displayed) Labs Reviewed  SARS CORONAVIRUS 2 (TAT 6-24 HRS)    EKG   Radiology No results found.  Procedures Procedures (including critical care time)  Medications Ordered in UC Medications - No data to display  Initial Impression / Assessment and Plan / UC Course  I have reviewed the  triage vital signs and the nursing notes.  Pertinent labs & imaging results that were available during my care of the patient were reviewed by me and considered in my medical decision making (see chart for details).     Covid test pending.  Recommending continued symptomatic and supportive care, will try Mucinex to help loosen up mucus in hopes to help with dryness sensation.  Tessalon for cough.  Doxycycline to cover for sinusitis/lower respiratory infection given symptoms greater than 1 week.  Diflucan if needed for yeast.  Rest and fluids.  Continue to monitor symptoms,Discussed strict return precautions. Patient verbalized understanding and is agreeable with plan.  Final Clinical  Impressions(s) / UC Diagnoses   Final diagnoses:  Lower respiratory infection (e.g., bronchitis, pneumonia, pneumonitis, pulmonitis)     Discharge Instructions     Covid test pending, monitor my chart for results Continue Flonase 1 to 2 spray in each nostril daily Mucinex DM twice daily for further congestion/cough relief Tessalon/benzonatate every 8 hours as needed for cough Doxycycline twice daily for the next week to cover infection in sinuses/lungs Diflucan prescribed if needed Rest and drink plenty of fluids  Please follow-up if any symptoms not improving or worsening despite use of the above   ED Prescriptions    Medication Sig Dispense Auth. Provider   doxycycline (VIBRAMYCIN) 100 MG capsule Take 1 capsule (100 mg total) by mouth 2 (two) times daily for 7 days. 14 capsule Janaysia Mcleroy C, PA-C   fluconazole (DIFLUCAN) 150 MG tablet Take 1 tablet (150 mg total) by mouth once for 1 dose. 2 tablet Seaborn Nakama C, PA-C   dextromethorphan-guaiFENesin (MUCINEX DM) 30-600 MG 12hr tablet Take 1 tablet by mouth 2 (two) times daily. 20 tablet Aayana Reinertsen C, PA-C   benzonatate (TESSALON) 200 MG capsule Take 1 capsule (200 mg total) by mouth 3 (three) times daily as needed for up to 7 days for cough. 28 capsule Leisel Pinette, [place] C, PA-C     PDMP not reviewed this encounter.   Janith Lima, [place] 06/01/20 2053

## 2020-06-01 NOTE — ED Triage Notes (Signed)
Pt c/o loss of taste, bad taste in mouth, dry throatx5 days. PT was exposed to [place] + person. Pt states feels dried out.

## 2020-06-02 LAB — SARS CORONAVIRUS 2 (TAT 6-24 HRS): SARS Coronavirus 2: NEGATIVE

## 2020-06-20 ENCOUNTER — Ambulatory Visit (HOSPITAL_COMMUNITY)
Admission: EM | Admit: 2020-06-20 | Discharge: 2020-06-20 | Disposition: A | Discharge status: Home / Self Care | Payer: BC Managed Care – PPO | Attending: Emergency Medicine | Admitting: Emergency Medicine

## 2020-06-20 ENCOUNTER — Ambulatory Visit (INDEPENDENT_AMBULATORY_CARE_PROVIDER_SITE_OTHER): Payer: BC Managed Care – PPO

## 2020-06-20 ENCOUNTER — Encounter (HOSPITAL_COMMUNITY): Payer: Self-pay | Admitting: *Deleted

## 2020-06-20 ENCOUNTER — Other Ambulatory Visit: Payer: Self-pay

## 2020-06-20 DIAGNOSIS — Z20822 Contact with and (suspected) exposure to covid-19: Secondary | ICD-10-CM | POA: Insufficient documentation

## 2020-06-20 DIAGNOSIS — Z882 Allergy status to sulfonamides status: Secondary | ICD-10-CM | POA: Insufficient documentation

## 2020-06-20 DIAGNOSIS — R05 Cough: Secondary | ICD-10-CM

## 2020-06-20 DIAGNOSIS — K219 Gastro-esophageal reflux disease without esophagitis: Secondary | ICD-10-CM | POA: Insufficient documentation

## 2020-06-20 DIAGNOSIS — F419 Anxiety disorder, unspecified: Secondary | ICD-10-CM | POA: Insufficient documentation

## 2020-06-20 DIAGNOSIS — F329 Major depressive disorder, single episode, unspecified: Secondary | ICD-10-CM | POA: Diagnosis not present

## 2020-06-20 DIAGNOSIS — R0982 Postnasal drip: Secondary | ICD-10-CM

## 2020-06-20 DIAGNOSIS — Z881 Allergy status to other antibiotic agents status: Secondary | ICD-10-CM | POA: Insufficient documentation

## 2020-06-20 DIAGNOSIS — M1990 Unspecified osteoarthritis, unspecified site: Secondary | ICD-10-CM | POA: Diagnosis not present

## 2020-06-20 DIAGNOSIS — R0602 Shortness of breath: Secondary | ICD-10-CM | POA: Insufficient documentation

## 2020-06-20 DIAGNOSIS — E669 Obesity, unspecified: Secondary | ICD-10-CM | POA: Diagnosis not present

## 2020-06-20 DIAGNOSIS — R439 Unspecified disturbances of smell and taste: Secondary | ICD-10-CM | POA: Diagnosis not present

## 2020-06-20 DIAGNOSIS — I10 Essential (primary) hypertension: Secondary | ICD-10-CM | POA: Insufficient documentation

## 2020-06-20 DIAGNOSIS — Z79899 Other long term (current) drug therapy: Secondary | ICD-10-CM | POA: Diagnosis not present

## 2020-06-20 DIAGNOSIS — Z79890 Hormone replacement therapy (postmenopausal): Secondary | ICD-10-CM | POA: Diagnosis not present

## 2020-06-20 HISTORY — DX: Spinal stenosis, site unspecified: M48.00

## 2020-06-20 MED ORDER — ALBUTEROL SULFATE HFA 108 (90 BASE) MCG/ACT IN AERS: 1.0000 | INHALATION_SPRAY | Freq: Four times a day (QID) | RESPIRATORY_TRACT | 0 refills | Status: AC | PRN

## 2020-06-20 MED ORDER — PREDNISONE 50 MG PO TABS: 50.0000 mg | ORAL_TABLET | Freq: Every day | ORAL | 0 refills | Status: AC

## 2020-06-20 MED ORDER — CETIRIZINE HCL 5 MG/5ML PO SOLN: 10.0000 mg | Freq: Every day | ORAL | 0 refills | Status: DC

## 2020-06-20 MED ORDER — NAPROXEN 500 MG PO TABS
500.0000 mg | ORAL_TABLET | Freq: Two times a day (BID) | ORAL | 0 refills | Status: DC
Start: 2020-06-20 — End: 2021-02-02

## 2020-06-20 NOTE — ED Triage Notes (Addendum)
Pt reports starting with general malaise and fatigue onset 05/27/20; pt was involved in funeral with multiple attendees testing positive for Covid, including pt's daughter (living in same household).  Pt tested negative for Covid 06/01/2020. C/O continued "phlegm in chest" and sore throat.  States 2 days ago felt "like something was sitting on the middle of my chest", which has resolved, and has felt more SOB.  Pt wishes to be retested for Covid today.

## 2020-06-20 NOTE — Discharge Instructions (Addendum)
Repeat COVID pending Chest x-ray without pneumonia Please continue Mucinex DM for mucus relief, please start using daily cetirizine to help with postnasal drainage Prednisone daily for 5 days to help with inflammation within chest and sinuses contributing to continued mucus production Albuterol inhaler as needed for shortness of breath, wheezing Honey and hot tea Naprosyn as needed for chest wall inflammation/back discomfort  Please continue to monitor symptoms and follow-up if still not improving or worsening

## 2020-06-21 LAB — SARS CORONAVIRUS 2 (TAT 6-24 HRS): SARS Coronavirus 2: NEGATIVE

## 2020-06-21 NOTE — ED Provider Notes (Signed)
[facility]    CSN: [id number] Arrival date & time: 06/20/20  1736      History   Chief Complaint Chief Complaint  Patient presents with  . Loss of taste  . Cough    HPI Gwendolyn Russell is a 54 y.o. female history of hypertension presenting today for evaluation of shortness of breath and mucus production.  Patient reports that she has had continued mucus production as well as sensation of shortness of breath.  Patient reports often when talking on the phone she will get winded.  She denies any associated cough with her symptoms.  Denies rhinorrhea or sore throat.  Denies fevers or chills.  Yesterday she did have a sensation of pressure on her chest centrally, but this is resolved.  She also reports discomfort in bilateral lower rib areas of back.  She previously tested negative for Covid at her prior visit.  Completed course of doxycycline, continues to use Mucinex.  05/27/2020 HPI Gwendolyn Russell is a 54 y.o. female history of hypertension, GERD, presenting today for evaluation of throat irritation and cough.  Patient reports for approximately 1 week she has had a cough as well as dryness throughout her sinuses throat and ears.  She has had an occasionally productive cough with thick brown mucus.  She has a bad taste in her throat which she describes as "infection".  She found out today she has had exposure to Covid within the past 2 weeks.  Reports recent loss in family and has been avoiding large gatherings with people that recently tested positive.  She denies fevers.  Denies chest pain or shortness of breath.  Denies GI symptoms.  HPI  Past Medical History:  Diagnosis Date  . Abnormal Pap smear   . Anemia    Hx  . Anxiety   . Arthritis    hips, knees, lower back  . Chronic back pain   . Depression   . DVT of leg (deep venous thrombosis), left   . GERD (gastroesophageal reflux disease)   . Headache   . Hypertension    Hx weight loss surgery, no current meds    . Insomnia   . Obesity   . PONV (postoperative nausea and vomiting)   . 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 Spinal stenosis   . SVD (spontaneous vaginal delivery)    x 2  . Vaginal delivery 1985, 1988    Patient Active Problem List   Diagnosis Date Noted  . Migraine 07/30/2019  . Obesity 11/15/2012    Past Surgical History:  Procedure Laterality Date  . ABDOMINAL HYSTERECTOMY    . BUNIONECTOMY Bilateral   . CARPAL TUNNEL RELEASE Left   . COLPOSCOPY    . LAPAROSCOPIC GASTRIC SLEEVE RESECTION  10/11/15  . LASER ABLATION CONDYLOMA CERVICAL / VULVAR    . LEEP    . MENISCUS REPAIR Right   . TONSILLECTOMY    . TUBAL LIGATION    . VAGINAL HYSTERECTOMY N/A 02/08/2016   Procedure: HYSTERECTOMY VAGINAL;  Surgeon: Woodroe Mode, MD;  Location: [facility];  Service: Gynecology;  Laterality: N/A;    OB History    Gravida  2   Para  2   Term  2   Preterm      AB      Living  2     SAB      TAB      Ectopic  Multiple      Live Births               Home Medications    Prior to Admission medications   Medication Sig Start Date End Date Taking? Authorizing Provider  ALPRAZolam Duanne Moron) 1 MG tablet Take 1 mg by mouth 3 (three) times daily.   Yes [provider]  Biotin 5000 MCG CAPS Take 1 capsule by mouth daily.  10/25/15  Yes [provider]  calcium citrate (CALCITRATE - DOSED IN MG ELEMENTAL CALCIUM) 950 MG tablet Take by mouth.   Yes [provider]  cyclobenzaprine (FLEXERIL) 10 MG tablet Take 10 mg by mouth at bedtime.   Yes [provider]  dextromethorphan-guaiFENesin (MUCINEX DM) 30-600 MG 12hr tablet Take 1 tablet by mouth 2 (two) times daily. 06/01/20  Yes Laycee Fitzsimmons C, PA-C  diclofenac sodium (VOLTAREN) 1 % GEL 4 (four) times a day as needed. for pain 09/02/15  Yes [provider]  Docusate Sodium (COLACE PO) Take 2 tablets by mouth daily.   Yes [provider]  estradiol (ESTRACE) 1 MG tablet Take 1 tablet (1 mg total) by mouth daily. 03/31/20  Yes Woodroe Mode, MD  famotidine (PEPCID) 20 MG tablet Take 20 mg by mouth 2 (two) times daily.   Yes [provider]  gabapentin (NEURONTIN) 300 MG capsule Take 300 mg by mouth 3 (three) times daily.   Yes [provider]  hydrochlorothiazide (HYDRODIURIL) 25 MG tablet Take 25 mg by mouth daily.   Yes [provider]  pantoprazole sodium (PROTONIX) 40 mg/20 mL PACK Place into feeding tube daily.   Yes [provider]  prazosin (MINIPRESS) 2 MG capsule Take 4 mg by mouth at bedtime.   Yes [provider]  Prenatal Vit-Fe Fumarate-FA (PRENATAL MULTIVITAMIN) TABS tablet Take 1 tablet by mouth daily at 12 noon.   Yes [provider]  Probiotic Product (PROBIOTIC DAILY PO) Take 1 tablet by mouth daily.   Yes [provider]  albuterol (VENTOLIN HFA) 108 (90 Base) MCG/ACT inhaler Inhale 1-2 puffs into the lungs every 6 (six) hours as needed for wheezing or shortness of breath. 06/20/20   Arrow Tomko C, PA-C  Calcium Polycarbophil (FIBERCON PO) Take 1 tablet by mouth daily.    [provider]  cetirizine HCl (ZYRTEC) 5 MG/5ML SOLN Take 10 mLs (10 mg total) by mouth daily. 06/20/20   Haiden Rawlinson C, PA-C  esomeprazole (NEXIUM) 40 MG capsule  11/25/16   [provider]  metoCLOPramide (REGLAN) 10 MG tablet Take 10 mg by mouth every 6 (six) hours as needed for nausea.  10/05/15   [provider]  naproxen (NAPROSYN) 500 MG tablet Take 1 tablet (500 mg total) by mouth 2 (two) times daily. 06/20/20   Luan Urbani C, PA-C  predniSONE (DELTASONE) 50 MG tablet Take 1 tablet (50 mg total) by mouth daily for 5 days. 06/20/20 06/25/20  Stori Royse C, PA-C  triamcinolone cream (KENALOG) 0.1 % Apply 1 application topically as needed (rash).  05/30/14   [provider]  nortriptyline (PAMELOR) 25 MG capsule Take 100  mg by mouth at bedtime.   06/01/20  [provider]  topiramate (TOPAMAX) 50 MG tablet Take 50 mg by mouth 3 (three) times daily.   06/01/20  [provider]  [name] 150-35 MCG/24HR transdermal patch APPLY ONE PATCH TOPICALLY TO SKIN ONCE A WEEK. REMOVE OLD PATCH. 04/28/19   Woodroe Mode, MD  Family History Family History  Problem Relation Age of Onset  . Heart disease Maternal Grandmother   . Diabetes Father   . Heart disease Father   . Deep vein thrombosis Father   . Early death Mother   . Breast cancer Sister   . Diabetes Sister     Social History Social History   Tobacco Use  . Smoking status: Never Smoker  . Smokeless tobacco: Never Used  Vaping Use  . Vaping Use: Never used  Substance Use Topics  . Alcohol use: No  . Drug use: No     Allergies   Cephalosporins, Clindamycin, Tdap [tetanus-diphth-acell pertussis], Sulfa antibiotics, and Tape   Review of Systems Review of Systems  Constitutional: Negative for activity change, appetite change, chills, fatigue and fever.  HENT: Positive for congestion. Negative for ear pain, rhinorrhea, sinus pressure, sore throat and trouble swallowing.   Eyes: Negative for discharge and redness.  Respiratory: Positive for shortness of breath. Negative for cough and chest tightness.   Cardiovascular: Negative for chest pain.  Gastrointestinal: Negative for abdominal pain, diarrhea, nausea and vomiting.  Musculoskeletal: Negative for myalgias.  Skin: Negative for rash.  Neurological: Negative for dizziness, light-headedness and headaches.     Physical Exam Triage Vital Signs ED Triage Vitals  Enc Vitals Group     BP 06/20/20 1833 123/82     Pulse Rate 06/20/20 1833 91     Resp 06/20/20 1833 20     Temp 06/20/20 1833 98.2 F (36.8 C)     Temp Source 06/20/20 1833 Oral     SpO2 06/20/20 1833 98 %     Weight --      Height --      Head Circumference --      Peak Flow --      Pain Score 06/20/20 1835 0       Pain Loc --      Pain Edu? --      Excl. in [place]? --    No data found.  Updated Vital Signs BP 123/82   Pulse 91   Temp 98.2 F (36.8 C) (Oral)   Resp 20 Comment: talkative  LMP 08/25/2015   SpO2 98%   Visual Acuity Right Eye Distance:   Left Eye Distance:   Bilateral Distance:    Right Eye Near:   Left Eye Near:    Bilateral Near:     Physical Exam Vitals and nursing note reviewed.  Constitutional:      Appearance: She is well-developed.     Comments: No acute distress  HENT:     Head: Normocephalic and atraumatic.     Ears:     Comments: Bilateral ears without tenderness to palpation of external auricle, tragus and mastoid, EAC's without erythema or swelling, TM's with good bony landmarks and cone of light. Non erythematous.     Nose: Nose normal.     Mouth/Throat:     Comments: Oral mucosa pink and moist, no tonsillar enlargement or exudate. Posterior pharynx patent and nonerythematous, no uvula deviation or swelling. Normal phonation.  Eyes:     Conjunctiva/sclera: Conjunctivae normal.  Cardiovascular:     Rate and Rhythm: Normal rate.  Pulmonary:     Effort: Pulmonary effort is normal. No respiratory distress.     Comments: Breathing comfortably at rest, CTABL, no wheezing, rales or other adventitious sounds auscultated  Abdominal:     General: There is no distension.  Musculoskeletal:  General: Normal range of motion.     Cervical back: Neck supple.     Comments: Mild reproducible tenderness to palpation of lower thoracic area on the right side  Skin:    General: Skin is warm and dry.  Neurological:     Mental Status: She is alert and oriented to person, place, and time.      UC Treatments / Results  Labs (all labs ordered are listed, but only abnormal results are displayed) Labs Reviewed  SARS CORONAVIRUS 2 (TAT 6-24 HRS)    EKG   Radiology DG Chest 2 View  Result Date: 06/20/2020 CLINICAL DATA:  Shortness of breath, cough EXAM:  CHEST - 2 VIEW COMPARISON:  05/28/2015 FINDINGS: The heart size and mediastinal contours are within normal limits. Both lungs are clear. The visualized skeletal structures are unremarkable. IMPRESSION: No active cardiopulmonary disease. Electronically Signed   By: Rolm Baptise M.D.   On: 06/20/2020 19:20    Procedures Procedures (including critical care time)  Medications Ordered in UC Medications - No data to display  Initial Impression / Assessment and Plan / UC Course  I have reviewed the triage vital signs and the nursing notes.  Pertinent labs & imaging results that were available during my care of the patient were reviewed by me and considered in my medical decision making (see chart for details).     EKG normal sinus rhythm, no acute signs of fever or extreme, stable from prior EKG.  Chest x-ray without acute process, no pneumonia.  Recommending to continue symptomatic and supportive care, deferring further antibiotic therapy.  Trial of prednisone to decrease inflammation contributing to continued mucus production.  Continue Mucinex, antihistamines.  Repeat Covid test today per patient request.  Discussed strict return precautions. Patient verbalized understanding and is agreeable with plan.  Final Clinical Impressions(s) / UC Diagnoses   Final diagnoses:  Shortness of breath  Post-nasal drainage     Discharge Instructions     Repeat COVID pending Chest x-ray without pneumonia Please continue Mucinex DM for mucus relief, please start using daily cetirizine to help with postnasal drainage Prednisone daily for 5 days to help with inflammation within chest and sinuses contributing to continued mucus production Albuterol inhaler as needed for shortness of breath, wheezing Honey and hot tea Naprosyn as needed for chest wall inflammation/back discomfort  Please continue to monitor symptoms and follow-up if still not improving or worsening   ED Prescriptions    Medication  Sig Dispense Auth. Provider   cetirizine HCl (ZYRTEC) 5 MG/5ML SOLN Take 10 mLs (10 mg total) by mouth daily. 118 mL Truitt Cruey C, PA-C   predniSONE (DELTASONE) 50 MG tablet Take 1 tablet (50 mg total) by mouth daily for 5 days. 5 tablet Epic Tribbett C, PA-C   albuterol (VENTOLIN HFA) 108 (90 Base) MCG/ACT inhaler Inhale 1-2 puffs into the lungs every 6 (six) hours as needed for wheezing or shortness of breath. 8 g Donabelle Molden C, PA-C   naproxen (NAPROSYN) 500 MG tablet Take 1 tablet (500 mg total) by mouth 2 (two) times daily. 30 tablet Jannatul Wojdyla, [place] C, PA-C     I have reviewed the PDMP during this encounter.   Joneen Caraway Kane C, PA-C 06/21/20 1025

## 2020-12-02 ENCOUNTER — Other Ambulatory Visit: Payer: Self-pay | Admitting: Obstetrics & Gynecology

## 2020-12-02 DIAGNOSIS — N951 Menopausal and female climacteric states: Secondary | ICD-10-CM

## 2020-12-27 ENCOUNTER — Ambulatory Visit: Payer: Medicare Other | Admitting: Obstetrics & Gynecology

## 2021-02-02 ENCOUNTER — Other Ambulatory Visit: Payer: Self-pay

## 2021-02-02 ENCOUNTER — Emergency Department (HOSPITAL_COMMUNITY): Payer: BC Managed Care – PPO

## 2021-02-02 ENCOUNTER — Emergency Department (HOSPITAL_COMMUNITY)
Admission: EM | Admit: 2021-02-02 | Discharge: 2021-02-02 | Disposition: A | Discharge status: Home / Self Care | Payer: BC Managed Care – PPO | Attending: Emergency Medicine | Admitting: Emergency Medicine

## 2021-02-02 ENCOUNTER — Encounter (HOSPITAL_COMMUNITY): Payer: Self-pay

## 2021-02-02 DIAGNOSIS — Z91040 Latex allergy status: Secondary | ICD-10-CM | POA: Insufficient documentation

## 2021-02-02 DIAGNOSIS — Z7982 Long term (current) use of aspirin: Secondary | ICD-10-CM | POA: Diagnosis not present

## 2021-02-02 DIAGNOSIS — R1010 Upper abdominal pain, unspecified: Secondary | ICD-10-CM | POA: Diagnosis present

## 2021-02-02 DIAGNOSIS — K8050 Calculus of bile duct without cholangitis or cholecystitis without obstruction: Secondary | ICD-10-CM | POA: Diagnosis not present

## 2021-02-02 DIAGNOSIS — I10 Essential (primary) hypertension: Secondary | ICD-10-CM | POA: Diagnosis not present

## 2021-02-02 DIAGNOSIS — Z79899 Other long term (current) drug therapy: Secondary | ICD-10-CM | POA: Diagnosis not present

## 2021-02-02 LAB — LIPASE, BLOOD: Lipase: 24 U/L (ref 11–51)

## 2021-02-02 LAB — COMPREHENSIVE METABOLIC PANEL
ALT: 77 U/L — ABNORMAL HIGH (ref 0–44)
AST: 127 U/L — ABNORMAL HIGH (ref 15–41)
Albumin: 3.7 g/dL (ref 3.5–5.0)
Alkaline Phosphatase: 133 U/L — ABNORMAL HIGH (ref 38–126)
Anion gap: 9 (ref 5–15)
BUN: 28 mg/dL — ABNORMAL HIGH (ref 6–20)
CO2: 26 mmol/L (ref 22–32)
Calcium: 9.3 mg/dL (ref 8.9–10.3)
Chloride: 100 mmol/L (ref 98–111)
Creatinine, Ser: 1.12 mg/dL — ABNORMAL HIGH (ref 0.44–1.00)
GFR, Estimated: 58 mL/min — ABNORMAL LOW (ref >60)
Glucose, Bld: 146 mg/dL — ABNORMAL HIGH (ref 70–99)
Potassium: 4.6 mmol/L (ref 3.5–5.1)
Sodium: 135 mmol/L (ref 135–145)
Total Bilirubin: 0.7 mg/dL (ref 0.3–1.2)
Total Protein: 7 g/dL (ref 6.5–8.1)

## 2021-02-02 LAB — I-STAT BETA HCG BLOOD, ED (MC, WL, AP ONLY): I-stat hCG, quantitative: <5 m[IU]/mL (ref <5)

## 2021-02-02 LAB — CBC
HCT: 34.9 % — ABNORMAL LOW (ref 36.0–46.0)
Hemoglobin: 10.8 g/dL — ABNORMAL LOW (ref 12.0–15.0)
MCH: 26.7 pg (ref 26.0–34.0)
MCHC: 30.9 g/dL (ref 30.0–36.0)
MCV: 86.2 fL (ref 80.0–100.0)
Platelets: 239 10*3/uL (ref 150–400)
RBC: 4.05 MIL/uL (ref 3.87–5.11)
RDW: 12.6 % (ref 11.5–15.5)
WBC: 9.3 10*3/uL (ref 4.0–10.5)
nRBC: 0 % (ref 0.0–0.2)

## 2021-02-02 MED ORDER — KETOROLAC TROMETHAMINE 30 MG/ML IJ SOLN
15.0000 mg | Freq: Once | INTRAMUSCULAR | Status: AC
  Administered 2021-02-02: 15 mg via INTRAVENOUS
  Filled 2021-02-02: qty 1

## 2021-02-02 MED ORDER — FAMOTIDINE IN NACL 20-0.9 MG/50ML-% IV SOLN
20.0000 mg | Freq: Once | INTRAVENOUS | Status: AC
  Administered 2021-02-02: 20 mg via INTRAVENOUS
  Filled 2021-02-02: qty 50

## 2021-02-02 MED ORDER — HYDROMORPHONE HCL 1 MG/ML IJ SOLN
1.0000 mg | Freq: Once | INTRAMUSCULAR | Status: AC
  Administered 2021-02-02: 1 mg via INTRAVENOUS
  Filled 2021-02-02: qty 1

## 2021-02-02 MED ORDER — ONDANSETRON HCL 4 MG/2ML IJ SOLN
4.0000 mg | Freq: Once | INTRAMUSCULAR | Status: AC
  Administered 2021-02-02: 4 mg via INTRAVENOUS
  Filled 2021-02-02: qty 2

## 2021-02-02 MED ORDER — OXYCODONE-ACETAMINOPHEN 2.5-325 MG PO TABS: 1.0000 | ORAL_TABLET | Freq: Four times a day (QID) | ORAL | 0 refills | Status: DC | PRN

## 2021-02-02 MED ORDER — ONDANSETRON HCL 4 MG/2ML IJ SOLN
INTRAMUSCULAR | Status: AC
  Filled 2021-02-02: qty 2

## 2021-02-02 MED ORDER — ONDANSETRON 8 MG PO TBDP: 8.0000 mg | ORAL_TABLET | Freq: Three times a day (TID) | ORAL | 0 refills | Status: AC | PRN

## 2021-02-02 NOTE — ED Triage Notes (Signed)
Pt c/o abd pain d/t hiatal hernia- hx of chronic pain but states pain is worse today. Pt c/o nausea

## 2021-02-02 NOTE — ED Provider Notes (Signed)
[name] DEPT Provider Note   CSN: [id number] Arrival date & time: 02/02/21  1806     History Chief Complaint  Patient presents with  . Abdominal Pain    Gwendolyn Russell is a 55 y.o. female.  HPI   Patient presented to the emergency room with complaints of abdominal pain.  Patient states Gwendolyn Russell does have a history of recurrent nausea and vomiting.  Patient has a history of gastric bypass and has recurrent issues since then.  Gwendolyn Russell states Gwendolyn Russell also has a hiatal hernia.  Gwendolyn Russell sees gastroenterology at [facility].  Patient however states that since early this morning around 2 AM Gwendolyn Russell has had constant abdominal pain.  Patient states prior to that Gwendolyn Russell had a protein shake.  Pain is severe in her upper abdomen and goes towards her chest.  Gwendolyn Russell does have nausea but has not vomited.  Gwendolyn Russell denies any constipation.  No dysuria.  Past Medical History:  Diagnosis Date  . Abnormal Pap smear   . Anemia    Hx  . Anxiety   . Arthritis    hips, knees, lower back  . Chronic back pain   . Depression   . DVT of leg (deep venous thrombosis), left   . GERD (gastroesophageal reflux disease)   . Headache   . Hypertension    Hx weight loss surgery, no current meds  . Insomnia   . Obesity   . PONV (postoperative nausea and vomiting)   . 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 Spinal stenosis   . SVD (spontaneous vaginal delivery)    x 2  . Vaginal delivery 1985, 1988    Patient Active Problem List   Diagnosis Date Noted  . Migraine 07/30/2019  . Obesity 11/15/2012    Past Surgical History:  Procedure Laterality Date  . ABDOMINAL HYSTERECTOMY    . BUNIONECTOMY Bilateral   . CARPAL TUNNEL RELEASE Left   . COLPOSCOPY    . LAPAROSCOPIC GASTRIC SLEEVE RESECTION  10/11/15  . LASER ABLATION CONDYLOMA CERVICAL / VULVAR    . LEEP    . MENISCUS REPAIR Right   . TONSILLECTOMY    . TUBAL LIGATION    . VAGINAL HYSTERECTOMY N/A  02/08/2016   Procedure: HYSTERECTOMY VAGINAL;  Surgeon: Woodroe Mode, MD;  Location: [facility];  Service: Gynecology;  Laterality: N/A;     OB History    Gravida  2   Para  2   Term  2   Preterm      AB      Living  2     SAB      IAB      Ectopic      Multiple      Live Births              Family History  Problem Relation Age of Onset  . Heart disease Maternal Grandmother   . Diabetes Father   . Heart disease Father   . Deep vein thrombosis Father   . Early death Mother   . Breast cancer Sister   . Diabetes Sister     Social History   Tobacco Use  . Smoking status: Never Smoker  . Smokeless tobacco: Never Used  Vaping Use  . Vaping Use: Never used  Substance Use Topics  . Alcohol use: No  . Drug use: No    Home Medications Prior to Admission medications  Medication Sig Start Date End Date Taking? Authorizing Provider  albuterol (VENTOLIN HFA) 108 (90 Base) MCG/ACT inhaler Inhale 1-2 puffs into the lungs every 6 (six) hours as needed for wheezing or shortness of breath. 06/20/20  Yes Wieters, Hallie C, PA-C  ALPRAZolam (XANAX) 1 MG tablet Take 1 mg by mouth 3 (three) times daily as needed.   Yes [provider]  aspirin EC 81 MG tablet Take 81 mg by mouth daily. Swallow whole.   Yes [provider]  Biotin 10000 MCG TABS Take 10,000 Units by mouth daily.   Yes [provider]  Cyanocobalamin (VITAMIN B 12 PO) Take 1 capsule by mouth daily.   Yes [provider]  diclofenac Sodium (VOLTAREN) 1 % GEL Apply 2 g topically daily as needed (pain).   Yes [provider]  dicyclomine (BENTYL) 20 MG tablet Take 20 mg by mouth 3 (three) times daily before meals.   Yes [provider]  famotidine (PEPCID) 40 MG tablet Take 40 mg by mouth at bedtime.   Yes [provider]  gabapentin (NEURONTIN) 300 MG capsule Take 600 mg by mouth 3 (three) times daily.   Yes [provider]  hydrOXYzine  (VISTARIL) 25 MG capsule Take 25 mg by mouth at bedtime.   Yes [provider]  Lactobacillus (PROBIOTIC ACIDOPHILUS PO) Take 1 tablet by mouth daily.   Yes [provider]  levocetirizine (XYZAL) 5 MG tablet Take 5 mg by mouth daily as needed for allergies.   Yes [provider]  linaclotide (LINZESS) 290 MCG CAPS capsule Take 290 mcg by mouth daily before breakfast.   Yes [provider]  magnesium gluconate (MAGONATE) 500 MG tablet Take 500 mg by mouth daily.   Yes [provider]  nortriptyline (PAMELOR) 50 MG capsule Take 100 mg by mouth at bedtime.   Yes [provider]  ondansetron (ZOFRAN ODT) 8 MG disintegrating tablet Take 1 tablet (8 mg total) by mouth every 8 (eight) hours as needed for nausea or vomiting. 02/02/21  Yes Dorie Rank, MD  Oxcarbazepine (TRILEPTAL) 300 MG tablet Take 150-300 mg by mouth See admin instructions. Takes 1/2 tablet in the morning and 1 tablet at night   Yes [provider]  oxycodone-acetaminophen (PERCOCET) 2.5-325 MG tablet Take 1 tablet by mouth every 6 (six) hours as needed for pain. 02/02/21  Yes Dorie Rank, MD  pantoprazole (PROTONIX) 40 MG tablet Take 40 mg by mouth 2 (two) times daily.   Yes [provider]  prazosin (MINIPRESS) 5 MG capsule Take 5 mg by mouth at bedtime.   Yes [provider]  Prenatal Vit-Fe Fumarate-FA (PRENATAL MULTIVITAMIN) TABS tablet Take 1 tablet by mouth daily at 12 noon.   Yes [provider]  sucralfate (CARAFATE) 1 g tablet Take 1 g by mouth daily as needed (stomach pain).   Yes [provider]  traMADol (ULTRAM) 50 MG tablet Take 50 mg by mouth every 6 (six) hours as needed for moderate pain.   Yes [provider]  Biotin 5000 MCG CAPS Take 1 capsule by mouth daily.  10/25/15   [provider]  calcium citrate (CALCITRATE - DOSED IN MG ELEMENTAL CALCIUM) 950 MG tablet Take by mouth.    [provider]   cetirizine HCl (ZYRTEC) 5 MG/5ML SOLN Take 10 mLs (10 mg total) by mouth daily. 06/20/20   Wieters, Hallie C, PA-C  cyclobenzaprine (FLEXERIL) 10 MG tablet Take 10 mg by mouth at bedtime.  [provider]  dextromethorphan-guaiFENesin (MUCINEX DM) 30-600 MG 12hr tablet Take 1 tablet by mouth 2 (two) times daily. 06/01/20   Wieters, Hallie C, PA-C  diclofenac sodium (VOLTAREN) 1 % GEL 4 (four) times a day as needed. for pain 09/02/15   [provider]  Docusate Sodium (COLACE PO) Take 2 tablets by mouth daily.    [provider]  estradiol (ESTRACE) 1 MG tablet Take 1 tablet (1 mg total) by mouth daily. 03/31/20   Woodroe Mode, MD  famotidine (PEPCID) 20 MG tablet Take 20 mg by mouth 2 (two) times daily.    [provider]  hydrochlorothiazide (HYDRODIURIL) 25 MG tablet Take 25 mg by mouth daily.    [provider]  metoCLOPramide (REGLAN) 10 MG tablet Take 10 mg by mouth every 6 (six) hours as needed for nausea.  10/05/15   [provider]  naproxen (NAPROSYN) 500 MG tablet Take 1 tablet (500 mg total) by mouth 2 (two) times daily. 06/20/20   Wieters, Hallie C, PA-C  Prenatal Vit-Fe Fumarate-FA (PRENATAL MULTIVITAMIN) TABS tablet Take 1 tablet by mouth daily at 12 noon.    [provider]  Probiotic Product (PROBIOTIC DAILY PO) Take 1 tablet by mouth daily.    [provider]  triamcinolone cream (KENALOG) 0.1 % Apply 1 application topically as needed (rash).  05/30/14   [provider]  norelgestromin-ethinyl estradiol [name]) 150-35 MCG/24HR transdermal patch APPLY ONE PATCH TOPICALLY TO SKIN ONCE A WEEK. REMOVE OLD PATCH. 06/19/19 06/01/20  Woodroe Mode, MD  topiramate (TOPAMAX) 50 MG tablet Take 50 mg by mouth 3 (three) times daily.   06/01/20  [provider]    Allergies    Nsaids, Cephalosporins, Clindamycin, Tdap [tetanus-diphth-acell pertussis], Latex, Sulfa antibiotics, and Tape  Review of Systems    Review of Systems  All other systems reviewed and are negative.   Physical Exam Updated Vital Signs BP 132/74   Pulse 84   Temp 97.6 F (36.4 C) (Oral)   Resp 14   Ht 1.676 m (5\' 6" )   Wt 111 kg   LMP 08/25/2015   SpO2 95%   BMI 39.50 kg/m   Physical Exam Vitals and nursing note reviewed.  Constitutional:      General: Gwendolyn Russell is in acute distress.     Appearance: Gwendolyn Russell is well-developed.  HENT:     Head: Normocephalic and atraumatic.     Right Ear: External ear normal.     Left Ear: External ear normal.  Eyes:     General: No scleral icterus.       Right eye: No discharge.        Left eye: No discharge.     Conjunctiva/sclera: Conjunctivae normal.  Neck:     Trachea: No tracheal deviation.  Cardiovascular:     Rate and Rhythm: Normal rate and regular rhythm.  Pulmonary:     Effort: Pulmonary effort is normal. No respiratory distress.     Breath sounds: Normal breath sounds. No stridor. No wheezing or rales.  Abdominal:     General: Bowel sounds are normal. There is no distension.     Palpations: Abdomen is soft.     Tenderness: There is abdominal tenderness in the right upper quadrant and epigastric area. There is guarding. There is no rebound.  Musculoskeletal:        General: No tenderness.     Cervical back: Neck supple.  Skin:    General: Skin is warm and  dry.     Findings: No rash.  Neurological:     Mental Status: Gwendolyn Russell is alert.     Cranial Nerves: No cranial nerve deficit (no facial droop, extraocular movements intact, no slurred speech).     Sensory: No sensory deficit.     Motor: No abnormal muscle tone or seizure activity.     Coordination: Coordination normal.     ED Results / Procedures / Treatments   Labs (all labs ordered are listed, but only abnormal results are displayed) Labs Reviewed  COMPREHENSIVE METABOLIC PANEL - Abnormal; Notable for the following components:      Result Value   Glucose, Bld 146 (*)    BUN 28 (*)    Creatinine, Ser  1.12 (*)    AST 127 (*)    ALT 77 (*)    Alkaline Phosphatase 133 (*)    GFR, Estimated 58 (*)    All other components within normal limits  CBC - Abnormal; Notable for the following components:   Hemoglobin 10.8 (*)    HCT 34.9 (*)    All other components within normal limits  LIPASE, BLOOD  URINALYSIS, ROUTINE W REFLEX MICROSCOPIC  I-STAT BETA HCG BLOOD, ED ([facility], [facility], [facility] ONLY)    EKG EKG Interpretation  Date/Time:  Wednesday February 02 2021 19:34:06 EDT Ventricular Rate:  91 PR Interval:    QRS Duration: 108 QT Interval:  360 QTC Calculation: 443 R Axis:   0 Text Interpretation: Sinus rhythm Borderline prolonged PR interval Probable anteroseptal infarct, old No significant change since last tracing Confirmed by Dorie Rank [phone]) on 02/02/2021 8:10:12 PM   Radiology US Abdomen Complete  Result Date: 02/02/2021 CLINICAL DATA:  Abdominal pain, elevated LFTs EXAM: ABDOMEN ULTRASOUND COMPLETE COMPARISON:  11/28/2014 FINDINGS: Gallbladder: Numerous stones throughout the gallbladder measuring up to 1 cm. Sludge within the gallbladder. Small amount of pericholecystic fluid. No wall thickening or sonographic Murphy sign. Common bile duct: Diameter: Upper limits normal in caliber, 6 mm. Liver: Increased echotexture compatible with fatty infiltration. No focal abnormality or biliary ductal dilatation. Portal vein is patent on color Doppler imaging with normal direction of blood flow towards the liver. IVC: No abnormality visualized. Pancreas: Visualized portion unremarkable. Spleen: Size and appearance within normal limits. Right Kidney: Length: 10.5 cm. Echogenicity within normal limits. No mass or hydronephrosis visualized. Left Kidney: Length: 9.8 cm. Echogenicity within normal limits. No mass or hydronephrosis visualized. Abdominal aorta: No aneurysm visualized. Other findings: None. IMPRESSION: Stones and sludge within the gallbladder. Small amount of pericholecystic fluid without wall  thickening or sonographic Murphy sign. Recommend clinical correlation to exclude early acute cholecystitis. Hepatic steatosis. Electronically Signed   By: Rolm Baptise M.D.   On: 02/02/2021 21:09   DG Chest Portable 1 View  Result Date: 02/02/2021 CLINICAL DATA:  Abdominal pain, chest pain EXAM: PORTABLE CHEST 1 VIEW COMPARISON:  01/14/2021 FINDINGS: Heart and mediastinal contours are within normal limits. No focal opacities or effusions. No acute bony abnormality. IMPRESSION: Negative. Electronically Signed   By: Rolm Baptise M.D.   On: 02/02/2021 19:54    Procedures Procedures   Medications Ordered in ED Medications  ondansetron (ZOFRAN) 4 MG/2ML injection (  Not Given 02/02/21 1948)  HYDROmorphone (DILAUDID) injection 1 mg (1 mg Intravenous Given 02/02/21 1947)  ondansetron (ZOFRAN) injection 4 mg (4 mg Intravenous Given 02/02/21 1947)  famotidine (PEPCID) IVPB 20 mg premix (0 mg Intravenous Stopped 02/02/21 2032)  ketorolac (TORADOL) 30 MG/ML injection 15 mg (15 mg Intravenous Given  02/02/21 2237)    ED Course  I have reviewed the triage vital signs and the nursing notes.  Pertinent labs & imaging results that were available during my care of the patient were reviewed by me and considered in my medical decision making (see chart for details).  Clinical Course as of 02/02/21 2245  Wed Feb 02, 2021  2010 LFT's are elevated compared to previous. [JK]  2146 Ultrasound shows gallstones.  Small amount of pericholecystic fluid.  No sonographic Murphy. [JK]  2234 Patient requested a dose of Toradol prior to discharge [JK]    Clinical Course User Index [JK] Dorie Rank, MD   MDM Rules/Calculators/A&P                          Patient presented to ED for evaluation of abdominal pain.  Patient does have history of recurrent issues with nausea vomiting abdominal discomfort after her bariatric surgery.  Gwendolyn Russell did have tenderness in the epigastric region.  Laboratory test did show elevation of LFTs.   Ultrasound was performed and Gwendolyn Russell had evidence of gallstones.  Patient improved with pain medications.  I doubt acute cholecystitis with no fever no white blood cell count.  Plan on discharge home with outpatient general surgery follow-up. Final Clinical Impression(s) / ED Diagnoses Final diagnoses:  Biliary colic    Rx / DC Orders ED Discharge Orders         Ordered    oxycodone-acetaminophen (PERCOCET) 2.5-325 MG tablet  Every 6 hours PRN        02/02/21 2240    ondansetron (ZOFRAN ODT) 8 MG disintegrating tablet  Every 8 hours PRN        02/02/21 2240           Dorie Rank, MD 02/02/21 2245

## 2021-02-02 NOTE — Discharge Instructions (Signed)
Take the medications as needed for pain.  Follow-up with a general surgeon as we discussed.  Return to the ED for fevers severe pain

## 2021-02-03 ENCOUNTER — Telehealth (HOSPITAL_COMMUNITY): Payer: Self-pay | Admitting: Emergency Medicine

## 2021-02-03 MED ORDER — OXYCODONE-ACETAMINOPHEN 5-325 MG PO TABS: 1.0000 | ORAL_TABLET | Freq: Four times a day (QID) | ORAL | 0 refills | Status: DC | PRN

## 2021-02-03 NOTE — Telephone Encounter (Signed)
Changed prescription to alternate pharmacy as requested

## 2021-02-04 ENCOUNTER — Emergency Department (HOSPITAL_COMMUNITY): Payer: BC Managed Care – PPO

## 2021-02-04 ENCOUNTER — Inpatient Hospital Stay (HOSPITAL_COMMUNITY)
Admission: EM | Admit: 2021-02-04 | Discharge: 2021-02-09 | DRG: 418 | Disposition: A | Discharge status: Home / Self Care | Payer: BC Managed Care – PPO | Attending: Student | Admitting: Student

## 2021-02-04 ENCOUNTER — Other Ambulatory Visit: Payer: Self-pay

## 2021-02-04 ENCOUNTER — Encounter (HOSPITAL_COMMUNITY): Payer: Self-pay

## 2021-02-04 DIAGNOSIS — Z881 Allergy status to other antibiotic agents status: Secondary | ICD-10-CM

## 2021-02-04 DIAGNOSIS — Z7982 Long term (current) use of aspirin: Secondary | ICD-10-CM

## 2021-02-04 DIAGNOSIS — M160 Bilateral primary osteoarthritis of hip: Secondary | ICD-10-CM | POA: Diagnosis present

## 2021-02-04 DIAGNOSIS — K8020 Calculus of gallbladder without cholecystitis without obstruction: Secondary | ICD-10-CM

## 2021-02-04 DIAGNOSIS — K810 Acute cholecystitis: Secondary | ICD-10-CM | POA: Diagnosis not present

## 2021-02-04 DIAGNOSIS — Z887 Allergy status to serum and vaccine status: Secondary | ICD-10-CM

## 2021-02-04 DIAGNOSIS — K760 Fatty (change of) liver, not elsewhere classified: Secondary | ICD-10-CM | POA: Diagnosis present

## 2021-02-04 DIAGNOSIS — M170 Bilateral primary osteoarthritis of knee: Secondary | ICD-10-CM | POA: Diagnosis present

## 2021-02-04 DIAGNOSIS — K8063 Calculus of gallbladder and bile duct with acute cholecystitis with obstruction: Principal | ICD-10-CM | POA: Diagnosis present

## 2021-02-04 DIAGNOSIS — F32A Depression, unspecified: Secondary | ICD-10-CM | POA: Diagnosis present

## 2021-02-04 DIAGNOSIS — Z20822 Contact with and (suspected) exposure to covid-19: Secondary | ICD-10-CM | POA: Diagnosis present

## 2021-02-04 DIAGNOSIS — Z9884 Bariatric surgery status: Secondary | ICD-10-CM

## 2021-02-04 DIAGNOSIS — Z9851 Tubal ligation status: Secondary | ICD-10-CM

## 2021-02-04 DIAGNOSIS — F419 Anxiety disorder, unspecified: Secondary | ICD-10-CM | POA: Diagnosis present

## 2021-02-04 DIAGNOSIS — I10 Essential (primary) hypertension: Secondary | ICD-10-CM | POA: Diagnosis present

## 2021-02-04 DIAGNOSIS — N179 Acute kidney failure, unspecified: Secondary | ICD-10-CM

## 2021-02-04 DIAGNOSIS — G43909 Migraine, unspecified, not intractable, without status migrainosus: Secondary | ICD-10-CM | POA: Diagnosis present

## 2021-02-04 DIAGNOSIS — Z91048 Other nonmedicinal substance allergy status: Secondary | ICD-10-CM

## 2021-02-04 DIAGNOSIS — Z79890 Hormone replacement therapy (postmenopausal): Secondary | ICD-10-CM

## 2021-02-04 DIAGNOSIS — M479 Spondylosis, unspecified: Secondary | ICD-10-CM | POA: Diagnosis present

## 2021-02-04 DIAGNOSIS — Z8249 Family history of ischemic heart disease and other diseases of the circulatory system: Secondary | ICD-10-CM

## 2021-02-04 DIAGNOSIS — Z91040 Latex allergy status: Secondary | ICD-10-CM

## 2021-02-04 DIAGNOSIS — Z6841 Body Mass Index (BMI) 40.0 and over, adult: Secondary | ICD-10-CM

## 2021-02-04 DIAGNOSIS — Z79899 Other long term (current) drug therapy: Secondary | ICD-10-CM

## 2021-02-04 DIAGNOSIS — Z419 Encounter for procedure for purposes other than remedying health state, unspecified: Secondary | ICD-10-CM

## 2021-02-04 DIAGNOSIS — G8929 Other chronic pain: Secondary | ICD-10-CM | POA: Diagnosis present

## 2021-02-04 DIAGNOSIS — K2970 Gastritis, unspecified, without bleeding: Secondary | ICD-10-CM | POA: Diagnosis present

## 2021-02-04 DIAGNOSIS — K5730 Diverticulosis of large intestine without perforation or abscess without bleeding: Secondary | ICD-10-CM | POA: Diagnosis present

## 2021-02-04 DIAGNOSIS — M4800 Spinal stenosis, site unspecified: Secondary | ICD-10-CM | POA: Diagnosis present

## 2021-02-04 DIAGNOSIS — K219 Gastro-esophageal reflux disease without esophagitis: Secondary | ICD-10-CM | POA: Diagnosis present

## 2021-02-04 DIAGNOSIS — Z888 Allergy status to other drugs, medicaments and biological substances status: Secondary | ICD-10-CM

## 2021-02-04 DIAGNOSIS — Z86718 Personal history of other venous thrombosis and embolism: Secondary | ICD-10-CM

## 2021-02-04 DIAGNOSIS — R1011 Right upper quadrant pain: Secondary | ICD-10-CM

## 2021-02-04 DIAGNOSIS — Z882 Allergy status to sulfonamides status: Secondary | ICD-10-CM

## 2021-02-04 DIAGNOSIS — Z90710 Acquired absence of both cervix and uterus: Secondary | ICD-10-CM

## 2021-02-04 DIAGNOSIS — M5440 Lumbago with sciatica, unspecified side: Secondary | ICD-10-CM | POA: Diagnosis present

## 2021-02-04 DIAGNOSIS — D509 Iron deficiency anemia, unspecified: Secondary | ICD-10-CM | POA: Diagnosis present

## 2021-02-04 LAB — URINALYSIS, ROUTINE W REFLEX MICROSCOPIC
Glucose, UA: NEGATIVE mg/dL
Hgb urine dipstick: NEGATIVE
Ketones, ur: NEGATIVE mg/dL
Leukocytes,Ua: NEGATIVE
Nitrite: NEGATIVE
Protein, ur: NEGATIVE mg/dL
Specific Gravity, Urine: 1.019 (ref 1.005–1.030)
pH: 5 (ref 5.0–8.0)

## 2021-02-04 LAB — COMPREHENSIVE METABOLIC PANEL
ALT: 333 U/L — ABNORMAL HIGH (ref 0–44)
AST: 125 U/L — ABNORMAL HIGH (ref 15–41)
Albumin: 3.6 g/dL (ref 3.5–5.0)
Alkaline Phosphatase: 284 U/L — ABNORMAL HIGH (ref 38–126)
Anion gap: 13 (ref 5–15)
BUN: 25 mg/dL — ABNORMAL HIGH (ref 6–20)
CO2: 23 mmol/L (ref 22–32)
Calcium: 8.9 mg/dL (ref 8.9–10.3)
Chloride: 100 mmol/L (ref 98–111)
Creatinine, Ser: 1.47 mg/dL — ABNORMAL HIGH (ref 0.44–1.00)
GFR, Estimated: 42 mL/min — ABNORMAL LOW (ref >60)
Glucose, Bld: 132 mg/dL — ABNORMAL HIGH (ref 70–99)
Potassium: 3.9 mmol/L (ref 3.5–5.1)
Sodium: 136 mmol/L (ref 135–145)
Total Bilirubin: 3.7 mg/dL — ABNORMAL HIGH (ref 0.3–1.2)
Total Protein: 7.6 g/dL (ref 6.5–8.1)

## 2021-02-04 LAB — CBC
HCT: 35.2 % — ABNORMAL LOW (ref 36.0–46.0)
Hemoglobin: 10.8 g/dL — ABNORMAL LOW (ref 12.0–15.0)
MCH: 26.7 pg (ref 26.0–34.0)
MCHC: 30.7 g/dL (ref 30.0–36.0)
MCV: 86.9 fL (ref 80.0–100.0)
Platelets: 237 10*3/uL (ref 150–400)
RBC: 4.05 MIL/uL (ref 3.87–5.11)
RDW: 13.2 % (ref 11.5–15.5)
WBC: 6.6 10*3/uL (ref 4.0–10.5)
nRBC: 0 % (ref 0.0–0.2)

## 2021-02-04 LAB — RESP PANEL BY RT-PCR (FLU A&B, COVID) ARPGX2
Influenza A by PCR: NEGATIVE
Influenza B by PCR: NEGATIVE
SARS Coronavirus 2 by RT PCR: NEGATIVE

## 2021-02-04 LAB — LIPASE, BLOOD: Lipase: 19 U/L (ref 11–51)

## 2021-02-04 MED ORDER — GABAPENTIN 300 MG PO CAPS
600.0000 mg | ORAL_CAPSULE | Freq: Three times a day (TID) | ORAL | Status: DC
  Administered 2021-02-05 – 2021-02-08 (×10): 600 mg via ORAL
  Filled 2021-02-04 (×10): qty 2

## 2021-02-04 MED ORDER — PIPERACILLIN-TAZOBACTAM 3.375 G IVPB 30 MIN: 3.3750 g | Freq: Four times a day (QID) | INTRAVENOUS | Status: DC

## 2021-02-04 MED ORDER — SODIUM CHLORIDE 0.9 % IV SOLN: INTRAVENOUS | Status: AC

## 2021-02-04 MED ORDER — PRAZOSIN HCL 5 MG PO CAPS
5.0000 mg | ORAL_CAPSULE | Freq: Every day | ORAL | Status: DC
  Administered 2021-02-05 – 2021-02-08 (×4): 5 mg via ORAL
  Filled 2021-02-04 (×4): qty 1

## 2021-02-04 MED ORDER — HYDROXYZINE HCL 25 MG PO TABS
25.0000 mg | ORAL_TABLET | Freq: Every day | ORAL | Status: DC
  Administered 2021-02-05 – 2021-02-08 (×5): 25 mg via ORAL
  Filled 2021-02-04 (×5): qty 1

## 2021-02-04 MED ORDER — SODIUM CHLORIDE 0.9 % IV BOLUS
1000.0000 mL | Freq: Once | INTRAVENOUS | Status: AC
  Administered 2021-02-04: 1000 mL via INTRAVENOUS

## 2021-02-04 MED ORDER — ALBUTEROL SULFATE HFA 108 (90 BASE) MCG/ACT IN AERS
1.0000 | INHALATION_SPRAY | Freq: Four times a day (QID) | RESPIRATORY_TRACT | Status: DC | PRN
  Administered 2021-02-05 – 2021-02-06 (×2): 2 via RESPIRATORY_TRACT
  Filled 2021-02-04: qty 6.7

## 2021-02-04 MED ORDER — HYDROXYZINE PAMOATE 25 MG PO CAPS: 25.0000 mg | ORAL_CAPSULE | Freq: Every day | ORAL | Status: DC

## 2021-02-04 MED ORDER — HYDROMORPHONE HCL 1 MG/ML IJ SOLN
0.5000 mg | INTRAMUSCULAR | Status: DC | PRN
  Administered 2021-02-05 – 2021-02-07 (×4): 0.5 mg via INTRAVENOUS
  Filled 2021-02-04: qty 1
  Filled 2021-02-04 (×3): qty 0.5

## 2021-02-04 MED ORDER — ONDANSETRON HCL 4 MG PO TABS: 4.0000 mg | ORAL_TABLET | Freq: Four times a day (QID) | ORAL | Status: DC | PRN

## 2021-02-04 MED ORDER — OXCARBAZEPINE 150 MG PO TABS
150.0000 mg | ORAL_TABLET | Freq: Every day | ORAL | Status: DC
  Administered 2021-02-07: 150 mg via ORAL
  Filled 2021-02-04 (×4): qty 1

## 2021-02-04 MED ORDER — ONDANSETRON HCL 4 MG/2ML IJ SOLN: 4.0000 mg | Freq: Four times a day (QID) | INTRAMUSCULAR | Status: DC | PRN

## 2021-02-04 MED ORDER — PIPERACILLIN-TAZOBACTAM 3.375 G IVPB 30 MIN
3.3750 g | Freq: Once | INTRAVENOUS | Status: AC
  Administered 2021-02-04: 3.375 g via INTRAVENOUS
  Filled 2021-02-04: qty 50

## 2021-02-04 MED ORDER — OXCARBAZEPINE 300 MG PO TABS
300.0000 mg | ORAL_TABLET | Freq: Every day | ORAL | Status: DC
  Administered 2021-02-05 – 2021-02-08 (×5): 300 mg via ORAL
  Filled 2021-02-04 (×3): qty 1
  Filled 2021-02-04: qty 2
  Filled 2021-02-04: qty 1

## 2021-02-04 MED ORDER — NORTRIPTYLINE HCL 25 MG PO CAPS
100.0000 mg | ORAL_CAPSULE | Freq: Every day | ORAL | Status: DC
Start: 2021-02-05 — End: 2021-02-09
  Administered 2021-02-05 – 2021-02-08 (×4): 100 mg via ORAL
  Filled 2021-02-04 (×4): qty 4

## 2021-02-04 MED ORDER — PANTOPRAZOLE SODIUM 40 MG PO TBEC
40.0000 mg | DELAYED_RELEASE_TABLET | Freq: Two times a day (BID) | ORAL | Status: DC
  Administered 2021-02-05 – 2021-02-08 (×6): 40 mg via ORAL
  Filled 2021-02-04 (×6): qty 1

## 2021-02-04 MED ORDER — FENTANYL CITRATE (PF) 100 MCG/2ML IJ SOLN
50.0000 ug | Freq: Once | INTRAMUSCULAR | Status: AC
Start: 2021-02-04 — End: 2021-02-04
  Administered 2021-02-04: 50 ug via INTRAVENOUS
  Filled 2021-02-04: qty 2

## 2021-02-04 MED ORDER — PIPERACILLIN-TAZOBACTAM 3.375 G IVPB
3.3750 g | Freq: Three times a day (TID) | INTRAVENOUS | Status: DC
  Administered 2021-02-05 – 2021-02-09 (×11): 3.375 g via INTRAVENOUS
  Filled 2021-02-04 (×11): qty 50

## 2021-02-04 MED ORDER — SODIUM CHLORIDE 0.9% FLUSH: 3.0000 mL | Freq: Two times a day (BID) | INTRAVENOUS | Status: DC

## 2021-02-04 MED ORDER — GADOBUTROL 1 MMOL/ML IV SOLN
10.0000 mL | Freq: Once | INTRAVENOUS | Status: AC | PRN
  Administered 2021-02-04: 10 mL via INTRAVENOUS

## 2021-02-04 MED ORDER — POLYETHYLENE GLYCOL 3350 17 G PO PACK: 17.0000 g | PACK | Freq: Every day | ORAL | Status: DC | PRN

## 2021-02-04 MED ORDER — ALPRAZOLAM 1 MG PO TABS
1.0000 mg | ORAL_TABLET | Freq: Three times a day (TID) | ORAL | Status: DC | PRN
  Administered 2021-02-06 – 2021-02-08 (×5): 1 mg via ORAL
  Filled 2021-02-04 (×5): qty 1

## 2021-02-04 NOTE — ED Notes (Signed)
Patient transported to MRI 

## 2021-02-04 NOTE — TOC Initial Note (Signed)
Transition of Care [facility]) - Initial/Assessment Note    Patient Details  Name: Gwendolyn Russell MRN: [id number] Date of Birth: 10/31/1966  [facility] CM/SW Contact:    Erenest Rasher, RN Phone Number: [phone] 02/04/2021, 9:35 PM  Clinical Narrative:                 Received referral for assistance with medications. TOC CM spoke to pt about medication. States her copay are expensive. States her insurance does cover meds but she has several meds that the copay is $35. Explained she can discuss with attending meds and possible alternate low cost meds.   Expected Discharge Plan: Home/Self Care Barriers to Discharge: Continued Medical Work up   Patient Goals and CMS Choice Patient states their goals for this hospitalization and ongoing recovery are:: return home      Expected Discharge Plan and Services Expected Discharge Plan: Home/Self Care In-house Referral: Clinical Social Work Discharge Planning Services: CM Consult   Living arrangements for the past 2 months: [place]                                      Prior Living Arrangements/Services Living arrangements for the past 2 months: Single Family Home Lives with:: Spouse Patient language and need for interpreter reviewed:: Yes Do you feel safe going back to the place where you live?: Yes      Need for Family Participation in Patient Care: Yes (Comment) Care giver support system in place?: Yes (comment)   Criminal Activity/Legal Involvement Pertinent to Current Situation/Hospitalization: No - Comment as needed  Activities of Daily Living Home Assistive Devices/Equipment: Eyeglasses ADL Screening (condition at time of admission) Patient's cognitive ability adequate to safely complete daily activities?: Yes Is the patient deaf or have difficulty hearing?: No Does the patient have difficulty seeing, even when wearing glasses/contacts?: Yes (some trouble with peripheral vision) Does  the patient have difficulty concentrating, remembering, or making decisions?: Yes Patient able to express need for assistance with ADLs?: Yes Does the patient have difficulty dressing or bathing?: No Independently performs ADLs?: Yes (appropriate for developmental age) Does the patient have difficulty walking or climbing stairs?: No Weakness of Legs: Both Weakness of Arms/Hands: Both  Permission Sought/Granted Permission sought to share information with : Case Manager,PCP,Family Supports Permission granted to share information with : Yes, Verbal Permission Granted  Share Information with NAME: Gwendolyn Russell  Permission granted to share info w AGENCY: pharmacy, PCP  Permission granted to share info w Relationship: husband  Permission granted to share info w Contact Information: [phone]  Emotional Assessment   Attitude/Demeanor/Rapport: Gracious Affect (typically observed): Accepting Orientation: : Oriented to Self,Oriented to Place,Oriented to  Time,Oriented to Situation   Psych Involvement: No (comment)  Admission diagnosis:  Acute cholecystitis [K81.0] Patient Active Problem List   Diagnosis Date Noted  . Acute cholecystitis 02/04/2021  . Migraine 07/30/2019  . Migraine without aura and without status migrainosus, not intractable 01/12/2017  . Chronic GERD 12/29/2015  . Essential hypertension 12/29/2015  . Low back pain with sciatica 07/15/2015  . Anxiety 05/13/2015  . Obesity 11/15/2012   PCP:  Sofie Rower, [place]:   [place], [address] [place]. [place] [address] Phone: [phone] Fax: [phone]  [id number] [place], [place] [address] [place] [address]  Phone: [phone] Fax: [phone]     Social Determinants of Health (SDOH) Interventions    Readmission Risk Interventions No flowsheet data found.

## 2021-02-04 NOTE — H&P (Signed)
History and Physical   Trayce SHAUNESSY DOBRATZ NTI:[id number] DOB: [birth year] DOA: 02/04/2021  PCP: Sofie Rower, FNP   Patient coming from: Home  Chief Complaint: Abdominal pain, nausea, vomiting  HPI: Gwendolyn Russell is a 55 y.o. female with medical history significant of migraines, obesity, anxiety, DVT, GERD, hypertension who presents with worsening of abdominal pain and nausea and vomiting.  Patient has had ongoing intermittent abdominal pain for several weeks and has had associated recurrent nausea and vomiting, which has been nonbilious and nonbloody in nature.  She was seen for this issue 2 days ago in the ED with ultrasound noting sludge and stones but no cholecystitis patient was discharged with pain medication and outpatient general surgery follow-up.  She states that there is not an outpatient general surgeon follow-up available until April and that this morning at around 6 AM she suddenly had significant worsening of her abdominal pain consisting of a sharp and achy pain sometimes radiating to her back.  She tried Percocet which did not help.  She reports that she has not tolerated very much p.o. because she has had pain after eating.  She reports that she did have a fever to 101.1 at home but has not had a fever yet in the ED.  She denies chest pain, shortness of breath, constipation, diarrhea.  ED Course: Vital signs stable in the ED.  Lab work-up showed creatinine elevated to 1.47 from baseline of around 1, glucose 132, AST 125 AL T2 33, ALP 284, T bili 3.7.  CBC showed hemoglobin stable at 10.8 with a baseline of 11.  Lipase normal respiratory panel for flu and COVID pending and urinalysis without sign of infection.  Ultrasound showed gallstones, sludge and some minimal wall thickening.  General surgery was consulted and believe her presentation is suspicious for passing a stone, subsequently GI was consulted and recommend MRCP and if no stone is shown to be passing thin reconsult of general  surgery for possible cholecystectomy.  MRCP is pending.  Patient has received pain medication a liter of IV fluids and Zosyn in the ED.  Review of Systems: As per HPI otherwise all other systems reviewed and are negative.  Past Medical History:  Diagnosis Date  . Abnormal Pap smear   . Anemia    Hx  . Anxiety   . Arthritis    hips, knees, lower back  . Chronic back pain   . Depression   . DVT of leg (deep venous thrombosis), left   . GERD (gastroesophageal reflux disease)   . Headache   . Hypertension    Hx weight loss surgery, no current meds  . Insomnia   . Obesity   . PONV (postoperative nausea and vomiting)   . 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 Spinal stenosis   . SVD (spontaneous vaginal delivery)    x 2  . Vaginal delivery 1985, 1988    Past Surgical History:  Procedure Laterality Date  . ABDOMINAL HYSTERECTOMY    . BUNIONECTOMY Bilateral   . CARPAL TUNNEL RELEASE Left   . COLPOSCOPY    . LAPAROSCOPIC GASTRIC SLEEVE RESECTION  10/11/15  . LASER ABLATION CONDYLOMA CERVICAL / VULVAR    . LEEP    . MENISCUS REPAIR Right   . TONSILLECTOMY    . TUBAL LIGATION    . VAGINAL HYSTERECTOMY N/A 02/08/2016   Procedure: HYSTERECTOMY VAGINAL;  Surgeon: Woodroe Mode, MD;  Location: [facility];  Service: Gynecology;  Laterality: N/A;    Social History  reports that she has never smoked. She has never used smokeless tobacco. She reports that she does not drink alcohol and does not use drugs.  Allergies  Allergen Reactions  . Nsaids     Other reaction(s): Other Due to Gastric Bypass   . Tolmetin     Other reaction(s): Other (See Comments) Due to Gastric Bypass  . Zonisamide Rash  . Cephalosporins Hives  . Ciprofloxacin   . Clindamycin Itching  . Tdap [Tetanus-Diphth-Acell Pertussis]   . Latex Rash  . Sulfa Antibiotics Rash  . Tape Rash    Adhesive tape    Family History  Problem Relation Age of Onset  . Heart  disease Maternal Grandmother   . Diabetes Father   . Heart disease Father   . Deep vein thrombosis Father   . Early death Mother   . Breast cancer Sister   . Diabetes Sister   Reviewed on admission  Prior to Admission medications   Medication Sig Start Date End Date Taking? Authorizing Provider  acetaminophen (TYLENOL) 500 MG tablet Take 1,000 mg by mouth every 6 (six) hours as needed for moderate pain.    [provider]  albuterol (VENTOLIN HFA) 108 (90 Base) MCG/ACT inhaler Inhale 1-2 puffs into the lungs every 6 (six) hours as needed for wheezing or shortness of breath. 06/20/20   Wieters, Hallie C, PA-C  ALPRAZolam (XANAX) 1 MG tablet Take 1 mg by mouth 3 (three) times daily as needed.    [provider]  aspirin EC 81 MG tablet Take 81 mg by mouth daily. Swallow whole.    [provider]  betamethasone valerate ointment (VALISONE) 0.1 % Apply 1 application topically 2 (two) times daily. 09/03/20   [provider]  Biotin 10000 MCG TABS Take 10,000 Units by mouth daily.    [provider]  cetirizine HCl (ZYRTEC) 5 MG/5ML SOLN Take 10 mLs (10 mg total) by mouth daily. Patient not taking: Reported on 02/02/2021 06/20/20   Wieters, Madelynn Done C, PA-C  Cyanocobalamin (VITAMIN B 12 PO) Take 1 capsule by mouth daily.    [provider]  dextromethorphan-guaiFENesin (MUCINEX DM) 30-600 MG 12hr tablet Take 1 tablet by mouth 2 (two) times daily. Patient not taking: Reported on 02/02/2021 06/01/20   Wieters, Madelynn Done C, PA-C  diclofenac Sodium (VOLTAREN) 1 % GEL Apply 2 g topically daily as needed (pain).    [provider]  dicyclomine (BENTYL) 20 MG tablet Take 20 mg by mouth 3 (three) times daily before meals.    [provider]  estradiol (ESTRACE) 1 MG tablet Take 1 tablet (1 mg total) by mouth daily. Patient not taking: Reported on 02/02/2021 03/31/20   Woodroe Mode, MD  famotidine (PEPCID) 40 MG tablet Take 40 mg by mouth at  bedtime.    [provider]  fluocinonide ointment (LIDEX) 2.99 % Apply 1 application topically daily as needed. 10/05/20   [provider]  gabapentin (NEURONTIN) 300 MG capsule Take 600 mg by mouth 3 (three) times daily.    [provider]  hydrOXYzine (VISTARIL) 25 MG capsule Take 25 mg by mouth at bedtime.    [provider]  Lactobacillus (PROBIOTIC ACIDOPHILUS PO) Take 1 tablet by mouth daily.    [provider]  levocetirizine (XYZAL) 5 MG tablet Take 5 mg by mouth daily as needed for allergies.    [provider]  linaclotide Rolan Lipa) [place]  capsule Take 290 mcg by mouth daily before breakfast.    [provider]  magnesium gluconate (MAGONATE) 500 MG tablet Take 500 mg by mouth daily.    [provider]  nortriptyline (PAMELOR) 50 MG capsule Take 100 mg by mouth at bedtime.    [provider]  ondansetron (ZOFRAN ODT) 8 MG disintegrating tablet Take 1 tablet (8 mg total) by mouth every 8 (eight) hours as needed for nausea or vomiting. 02/02/21   Dorie Rank, MD  Oxcarbazepine (TRILEPTAL) 300 MG tablet Take 150-300 mg by mouth See admin instructions. Takes 1/2 tablet in the morning and 1 tablet at night    [provider]  oxyCODONE-acetaminophen (PERCOCET/ROXICET) 5-325 MG tablet Take 1 tablet by mouth every 6 (six) hours as needed for severe pain. 02/03/21   Dorie Rank, MD  pantoprazole (PROTONIX) 40 MG tablet Take 40 mg by mouth 2 (two) times daily.    [provider]  prazosin (MINIPRESS) 5 MG capsule Take 5 mg by mouth at bedtime.    [provider]  Prenatal Vit-Fe Fumarate-FA (PRENATAL MULTIVITAMIN) TABS tablet Take 1 tablet by mouth daily at 12 noon.    [provider]  Probiotic Product (PROBIOTIC DAILY PO) Take 1 tablet by mouth daily.    [provider]  sucralfate (CARAFATE) 1 g tablet Take 1 g by mouth daily as needed (stomach pain).    [provider]  traMADol (ULTRAM) 50 MG tablet Take 50 mg by mouth every 6 (six) hours as needed for moderate pain.    [provider]  norelgestromin-ethinyl estradiol [name]) 150-35 MCG/24HR transdermal patch APPLY ONE PATCH TOPICALLY TO SKIN ONCE A WEEK. REMOVE OLD PATCH. 06/19/19 06/01/20  Woodroe Mode, MD  topiramate (TOPAMAX) 50 MG tablet Take 50 mg by mouth 3 (three) times daily.   06/01/20  [provider]    Physical Exam: Vitals:   02/04/21 2000 02/04/21 2015 02/04/21 2030 02/04/21 2045  BP: 120/90 (!) 134/99 117/81 129/90  Pulse: 88 87  88  Resp: 13 (!) 24 20 18   Temp:      TempSrc:      SpO2: 98% 94%  95%  Weight:      Height:       Physical Exam Constitutional:      General: She is not in acute distress.    Appearance: Normal appearance.  HENT:     Head: Normocephalic and atraumatic.     Mouth/Throat:     Mouth: Mucous membranes are moist.     Pharynx: Oropharynx is clear.  Eyes:     Extraocular Movements: Extraocular movements intact.     Pupils: Pupils are equal, round, and reactive to light.  Cardiovascular:     Rate and Rhythm: Normal rate and regular rhythm.     Pulses: Normal pulses.     Heart sounds: Normal heart sounds.  Pulmonary:     Effort: Pulmonary effort is normal. No respiratory distress.     Breath sounds: Normal breath sounds.  Abdominal:     General: Bowel sounds are normal. There is no distension.     Palpations: Abdomen is soft.     Tenderness: There is abdominal tenderness in the right upper quadrant. Positive signs include Murphy's sign.  Musculoskeletal:        General: No swelling or deformity.  Skin:    General: Skin is warm and dry.  Neurological:     General: No focal deficit present.  Mental Status: Mental status is at baseline.     Labs on Admission: I have personally reviewed following labs and imaging studies  CBC: Recent Labs  Lab 02/02/21 1820 02/04/21 1411  WBC 9.3 6.6  HGB 10.8* 10.8*  HCT  34.9* 35.2*  MCV 86.2 86.9  PLT 239 580    Basic Metabolic Panel: Recent Labs  Lab 02/02/21 1820 02/04/21 1411  NA 135 136  K 4.6 3.9  CL 100 100  CO2 26 23  GLUCOSE 146* 132*  BUN 28* 25*  CREATININE 1.12* 1.47*  CALCIUM 9.3 8.9    GFR: Estimated Creatinine Clearance: 54 mL/min (A) (by C-G formula based on SCr of 1.47 mg/dL (H)).  Liver Function Tests: Recent Labs  Lab 02/02/21 1820 02/04/21 1411  AST 127* 125*  ALT 77* 333*  ALKPHOS 133* 284*  BILITOT 0.7 3.7*  PROT 7.0 7.6  ALBUMIN 3.7 3.6    Urine analysis:    Component Value Date/Time   COLORURINE AMBER (A) 02/04/2021 1411   APPEARANCEUR CLEAR 02/04/2021 1411   LABSPEC 1.019 02/04/2021 1411   PHURINE 5.0 02/04/2021 1411   GLUCOSEU NEGATIVE 02/04/2021 1411   HGBUR NEGATIVE 02/04/2021 1411   BILIRUBINUR SMALL (A) 02/04/2021 1411   KETONESUR NEGATIVE 02/04/2021 1411   PROTEINUR NEGATIVE 02/04/2021 1411   UROBILINOGEN 0.2 02/07/2017 1443   NITRITE NEGATIVE 02/04/2021 1411   LEUKOCYTESUR NEGATIVE 02/04/2021 1411    Radiological Exams on Admission: US Abdomen Limited RUQ (LIVER/GB)  Result Date: 02/04/2021 CLINICAL DATA:  RIGHT upper quadrant pain EXAM: ULTRASOUND ABDOMEN LIMITED RIGHT UPPER QUADRANT COMPARISON:  02/02/2021 FINDINGS: Gallbladder: Multiple shadowing calculi within gallbladder up to 13 mm diameter. Small amount of accompanying gallbladder sludge. Gallbladder wall minimally thickened 4 mm diameter, slightly increased from previous exam. No sonographic Murphy sign or pericholecystic fluid Common bile duct: Diameter: 6 mm, upper normal, unchanged Liver: Minimally increased echogenicity with smooth margins question fatty infiltration. No mass or nodularity. No intrahepatic biliary dilatation. Portal vein is patent on color Doppler imaging with normal direction of blood flow towards the liver. Other: No RIGHT upper quadrant free fluid. IMPRESSION: Multiple calculi and sludge within the gallbladder.  Minimal gallbladder wall thickening, new versus previous exam though no sonographic Murphy sign or pericholecystic fluid are identified. Electronically Signed   By: Lavonia Dana M.D.   On: 02/04/2021 19:22    EKG: Not obtained in ED  Assessment/Plan Principal Problem:   Acute cholecystitis Active Problems:   Migraine   Anxiety   Chronic GERD   Low back pain with sciatica   Essential hypertension  Right upper quadrant abdominal pain Cholecystitis ?Choledocholithiasis > Patient presenting with right upper quadrant pain was previously seen for this 2 days ago but pain has worsened and now with minimal wall thickening on ultrasound in addition to gallstones and sludge. > Positive Murphy sign on my exam the negative sonographic Murphy sign may been due to having pain medication prior to the exam. > LFTs have increased from 2 days ago including ALT, ALP and T bili. > General surgery consulted and think her presentation is suspicious for choledocholithiasis and recommend consulting GI GI recommend MRCP to prove there is a stone and if there is no stone passing then to call general surgery back.  EDP states they will contact GEN surge following results of pending MRCP if it returns during their shift. - General surgery will see the patient in the morning either way, appreciate their recommendations - Monitor on telemetry - Diet, sips  with meds - Follow-up MRCP - Has received a dose of Zosyn in ED, will continue  - Maintenance IVF - Continue to trend fever curve and CBC as well as LFTs  AKI > In the setting of decreased p.o. intake due to abdominal pain as above.  Creatinine elevated 1.47 from baseline of around 1. - Has received a 1 L bolus we will continue IV fluids overnight - Avoid nephrotoxic agents - Trend renal function and electrolytes  Anxiety/depression - Continue home prazosin, Xanax, hydroxyzine, nortriptyline  Migraines - Continue home  Hypertension - Hold home  spironolactone  Chronic pain  - Continue gabapentin and oxycodone  GERD - Continue PPI  History of sleeve gastrectomy - Noted  DVT prophylaxis: SCDs  Code Status:   Full  Family Communication:  None on admission Disposition Plan:   Patient is from:  Home  Anticipated DC to:  Home  Anticipated DC date:  1 to 5 days  Anticipated DC barriers: None  Consults called:  General surgery and GI contacted by EDP.  Case will be referred back to general surgery pending results of MRCP study.   Admission status:  Observation, telemetry   Severity of Illness: The appropriate patient status for this patient is OBSERVATION. Observation status is judged to be reasonable and necessary in order to provide the required intensity of service to ensure the patient's safety. The patient's presenting symptoms, physical exam findings, and initial radiographic and laboratory data in the context of their medical condition is felt to place them at decreased risk for further clinical deterioration. Furthermore, it is anticipated that the patient will be medically stable for discharge from the hospital within 2 midnights of admission. The following factors support the patient status of observation.   " The patient's presenting symptoms include right upper quad abdominal pain, fever at home, nausea and vomiting. " The physical exam findings include right upper quadrant abdominal pain. " The initial radiographic and laboratory data are significant for ultrasound with gallstones, sludge, minimal wall thickening.  Creatinine elevated to 1.47 from baseline of 1, AST 125, ALT 330, ALP 284, T bili 3.7.   Marcelyn Bruins MD [facility]  How to contact the [facility] Attending or Consulting provider [place] or covering provider during after hours [place], for this patient?   1. Check the care team in [facility] and look for a) attending/consulting [facility] provider listed and b) the [facility] team listed 2. Log into [email or web address] and  use [place]'s universal password to access. If you do not have the password, please contact the hospital operator. 3. Locate the [facility] provider you are looking for under [facility] and page to a number that you can be directly reached. 4. If you still have difficulty reaching the provider, please page the [facility] (Director on Call) for the Hospitalists listed on amion for assistance.  02/04/2021, 9:16 PM

## 2021-02-04 NOTE — ED Provider Notes (Signed)
[facility] DEPT Provider Note   CSN: [id number] Arrival date & time: 02/04/21  1335     History Chief Complaint  Patient presents with  . Fever  . Abdominal Pain    Gwendolyn Russell is a 55 y.o. female history of obesity, arthritis, chronic back pain, DVT, GERD, hypertension, obesity, hysterectomy.  Patient presented for right upper quadrant abdominal pain, she reports this has been an intermittent pain for the past few weeks and she has had recurrent nausea and nonbloody/nonbilious emesis.  She reports she was seen in the ER for the same problem around 2 days ago symptoms had improved.  This morning around 6 AM symptoms suddenly worsen she reports sharp aching pain occasionally radiating to her right back, no clear aggravating factors no alleviating factors she has tried Percocet without improvement pain is currently moderate in intensity.  Patient reports during her last visit she was informed that she had gallstones, she called general surgery but they do not have an appointment for her until the end of April.  Patient reports that she has had continued nausea today and noticed a fever of 101.1 F at home.  Denies chest pain/cough/hemoptysis, diarrhea, dysuria/hematuria, extremity swelling/color change or any additional concerns.  HPI     Past Medical History:  Diagnosis Date  . Abnormal Pap smear   . Anemia    Hx  . Anxiety   . Arthritis    hips, knees, lower back  . Chronic back pain   . Depression   . DVT of leg (deep venous thrombosis), left   . GERD (gastroesophageal reflux disease)   . Headache   . Hypertension    Hx weight loss surgery, no current meds  . Insomnia   . Obesity   . PONV (postoperative nausea and vomiting)   . 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 Spinal stenosis   . SVD (spontaneous vaginal delivery)    x 2  . Vaginal delivery 1985, 1988    Patient Active Problem  List   Diagnosis Date Noted  . Acute cholecystitis 02/04/2021  . Migraine 07/30/2019  . Migraine without aura and without status migrainosus, not intractable 01/12/2017  . Chronic GERD 12/29/2015  . Essential hypertension 12/29/2015  . Low back pain with sciatica 07/15/2015  . Anxiety 05/13/2015  . Obesity 11/15/2012    Past Surgical History:  Procedure Laterality Date  . ABDOMINAL HYSTERECTOMY    . BUNIONECTOMY Bilateral   . CARPAL TUNNEL RELEASE Left   . COLPOSCOPY    . LAPAROSCOPIC GASTRIC SLEEVE RESECTION  10/11/15  . LASER ABLATION CONDYLOMA CERVICAL / VULVAR    . LEEP    . MENISCUS REPAIR Right   . TONSILLECTOMY    . TUBAL LIGATION    . VAGINAL HYSTERECTOMY N/A 02/08/2016   Procedure: HYSTERECTOMY VAGINAL;  Surgeon: Woodroe Mode, MD;  Location: [facility];  Service: Gynecology;  Laterality: N/A;     OB History    Gravida  2   Para  2   Term  2   Preterm      AB      Living  2     SAB      IAB      Ectopic      Multiple      Live Births              Family History  Problem Relation Age of  Onset  . Heart disease Maternal Grandmother   . Diabetes Father   . Heart disease Father   . Deep vein thrombosis Father   . Early death Mother   . Breast cancer Sister   . Diabetes Sister     Social History   Tobacco Use  . Smoking status: Never Smoker  . Smokeless tobacco: Never Used  Vaping Use  . Vaping Use: Never used  Substance Use Topics  . Alcohol use: No  . Drug use: No    Home Medications Prior to Admission medications   Medication Sig Start Date End Date Taking? Authorizing Provider  acetaminophen (TYLENOL) 500 MG tablet Take 1,000 mg by mouth every 6 (six) hours as needed for moderate pain.    [provider]  albuterol (VENTOLIN HFA) 108 (90 Base) MCG/ACT inhaler Inhale 1-2 puffs into the lungs every 6 (six) hours as needed for wheezing or shortness of breath. 06/20/20   Wieters, Hallie C, PA-C  ALPRAZolam (XANAX) 1 MG tablet  Take 1 mg by mouth 3 (three) times daily as needed.    [provider]  aspirin EC 81 MG tablet Take 81 mg by mouth daily. Swallow whole.    [provider]  betamethasone valerate ointment (VALISONE) 0.1 % Apply 1 application topically 2 (two) times daily. 09/03/20   [provider]  Biotin 10000 MCG TABS Take 10,000 Units by mouth daily.    [provider]  cetirizine HCl (ZYRTEC) 5 MG/5ML SOLN Take 10 mLs (10 mg total) by mouth daily. Patient not taking: Reported on 02/02/2021 06/20/20   Wieters, Madelynn Done C, PA-C  Cyanocobalamin (VITAMIN B 12 PO) Take 1 capsule by mouth daily.    [provider]  dextromethorphan-guaiFENesin (MUCINEX DM) 30-600 MG 12hr tablet Take 1 tablet by mouth 2 (two) times daily. Patient not taking: Reported on 02/02/2021 06/01/20   Wieters, Madelynn Done C, PA-C  diclofenac Sodium (VOLTAREN) 1 % GEL Apply 2 g topically daily as needed (pain).    [provider]  dicyclomine (BENTYL) 20 MG tablet Take 20 mg by mouth 3 (three) times daily before meals.    [provider]  estradiol (ESTRACE) 1 MG tablet Take 1 tablet (1 mg total) by mouth daily. Patient not taking: Reported on 02/02/2021 03/31/20   Woodroe Mode, MD  famotidine (PEPCID) 40 MG tablet Take 40 mg by mouth at bedtime.    [provider]  fluocinonide ointment (LIDEX) 5.36 % Apply 1 application topically daily as needed. 10/05/20   [provider]  gabapentin (NEURONTIN) 300 MG capsule Take 600 mg by mouth 3 (three) times daily.    [provider]  hydrOXYzine (VISTARIL) 25 MG capsule Take 25 mg by mouth at bedtime.    [provider]  Lactobacillus (PROBIOTIC ACIDOPHILUS PO) Take 1 tablet by mouth daily.    [provider]  levocetirizine (XYZAL) 5 MG tablet Take 5 mg by mouth daily as needed for allergies.    [provider]  linaclotide (LINZESS) 290 MCG CAPS capsule Take 290 mcg by mouth daily before  breakfast.    [provider]  magnesium gluconate (MAGONATE) 500 MG tablet Take 500 mg by mouth daily.    [provider]  nortriptyline (PAMELOR) 50 MG capsule Take 100 mg by mouth at bedtime.    [provider]  ondansetron (ZOFRAN ODT) 8 MG disintegrating tablet Take 1 tablet (8 mg total) by mouth every 8 (eight) hours as needed for nausea or  vomiting. 02/02/21   Dorie Rank, MD  Oxcarbazepine (TRILEPTAL) 300 MG tablet Take 150-300 mg by mouth See admin instructions. Takes 1/2 tablet in the morning and 1 tablet at night    [provider]  oxyCODONE-acetaminophen (PERCOCET/ROXICET) 5-325 MG tablet Take 1 tablet by mouth every 6 (six) hours as needed for severe pain. 02/03/21   Dorie Rank, MD  pantoprazole (PROTONIX) 40 MG tablet Take 40 mg by mouth 2 (two) times daily.    [provider]  prazosin (MINIPRESS) 5 MG capsule Take 5 mg by mouth at bedtime.    [provider]  Prenatal Vit-Fe Fumarate-FA (PRENATAL MULTIVITAMIN) TABS tablet Take 1 tablet by mouth daily at 12 noon.    [provider]  Probiotic Product (PROBIOTIC DAILY PO) Take 1 tablet by mouth daily.    [provider]  sucralfate (CARAFATE) 1 g tablet Take 1 g by mouth daily as needed (stomach pain).    [provider]  traMADol (ULTRAM) 50 MG tablet Take 50 mg by mouth every 6 (six) hours as needed for moderate pain.    [provider]  norelgestromin-ethinyl estradiol [name]) 150-35 MCG/24HR transdermal patch APPLY ONE PATCH TOPICALLY TO SKIN ONCE A WEEK. REMOVE OLD PATCH. 06/19/19 06/01/20  Woodroe Mode, MD  topiramate (TOPAMAX) 50 MG tablet Take 50 mg by mouth 3 (three) times daily.   06/01/20  [provider]    Allergies    Nsaids, Tolmetin, Zonisamide, Cephalosporins, Ciprofloxacin, Clindamycin, Tdap [tetanus-diphth-acell pertussis], Latex, Sulfa antibiotics, and Tape  Review of Systems   Review of Systems Ten systems are  reviewed and are negative for acute change except as noted in the HPI  Physical Exam Updated Vital Signs BP 129/90   Pulse 88   Temp 97.6 F (36.4 C) (Oral)   Resp 18   Ht _0  (1.676 m)   Wt 111.1 kg   LMP 08/25/2015   SpO2 95%   BMI 39.54 kg/m   Physical Exam Constitutional:      General: She is not in acute distress.    Appearance: Normal appearance. She is well-developed. She is not ill-appearing or diaphoretic.  HENT:     Head: Normocephalic and atraumatic.  Eyes:     General: Vision grossly intact. Gaze aligned appropriately.     Pupils: Pupils are equal, round, and reactive to light.  Neck:     Trachea: Trachea and phonation normal.  Pulmonary:     Effort: Pulmonary effort is normal. No respiratory distress.  Abdominal:     General: There is no distension.     Palpations: Abdomen is soft.     Tenderness: There is abdominal tenderness in the right upper quadrant. There is guarding. There is no rebound. Positive signs include Murphy's sign.  Musculoskeletal:        General: Normal range of motion.     Cervical back: Normal range of motion.     Right lower leg: No edema.     Left lower leg: No edema.  Skin:    General: Skin is warm and dry.  Neurological:     Mental Status: She is alert.     GCS: GCS eye subscore is 4. GCS verbal subscore is 5. GCS motor subscore is 6.     Comments: Speech is clear and goal oriented, follows commands Major Cranial nerves without deficit, no facial droop Moves extremities without ataxia, coordination intact  Psychiatric:        Behavior: Behavior normal.  ED Results / Procedures / Treatments   Labs (all labs ordered are listed, but only abnormal results are displayed) Labs Reviewed  COMPREHENSIVE METABOLIC PANEL - Abnormal; Notable for the following components:      Result Value   Glucose, Bld 132 (*)    BUN 25 (*)    Creatinine, Ser 1.47 (*)    AST 125 (*)    ALT 333 (*)    Alkaline Phosphatase 284 (*)    Total  Bilirubin 3.7 (*)    GFR, Estimated 42 (*)    All other components within normal limits  CBC - Abnormal; Notable for the following components:   Hemoglobin 10.8 (*)    HCT 35.2 (*)    All other components within normal limits  URINALYSIS, ROUTINE W REFLEX MICROSCOPIC - Abnormal; Notable for the following components:   Color, Urine AMBER (*)    Bilirubin Urine SMALL (*)    All other components within normal limits  RESP PANEL BY RT-PCR (FLU A&B, COVID) ARPGX2  LIPASE, BLOOD  HIV ANTIBODY (ROUTINE TESTING W REFLEX)  COMPREHENSIVE METABOLIC PANEL  CBC    EKG None  Radiology US Abdomen Limited RUQ (LIVER/GB)  Result Date: 02/04/2021 CLINICAL DATA:  RIGHT upper quadrant pain EXAM: ULTRASOUND ABDOMEN LIMITED RIGHT UPPER QUADRANT COMPARISON:  02/02/2021 FINDINGS: Gallbladder: Multiple shadowing calculi within gallbladder up to 13 mm diameter. Small amount of accompanying gallbladder sludge. Gallbladder wall minimally thickened 4 mm diameter, slightly increased from previous exam. No sonographic Murphy sign or pericholecystic fluid Common bile duct: Diameter: 6 mm, upper normal, unchanged Liver: Minimally increased echogenicity with smooth margins question fatty infiltration. No mass or nodularity. No intrahepatic biliary dilatation. Portal vein is patent on color Doppler imaging with normal direction of blood flow towards the liver. Other: No RIGHT upper quadrant free fluid. IMPRESSION: Multiple calculi and sludge within the gallbladder. Minimal gallbladder wall thickening, new versus previous exam though no sonographic Murphy sign or pericholecystic fluid are identified. Electronically Signed   By: Lavonia Dana M.D.   On: 02/04/2021 19:22    Procedures Procedures   Medications Ordered in ED Medications  pantoprazole (PROTONIX) EC tablet 40 mg (has no administration in time range)  albuterol (VENTOLIN HFA) 108 (90 Base) MCG/ACT inhaler 1-2 puff (has no administration in time range)   HYDROmorphone (DILAUDID) injection 0.5 mg (has no administration in time range)  polyethylene glycol (MIRALAX / GLYCOLAX) packet 17 g (has no administration in time range)  ondansetron (ZOFRAN) tablet 4 mg (has no administration in time range)    Or  ondansetron (ZOFRAN) injection 4 mg (has no administration in time range)  sodium chloride 0.9 % bolus 1,000 mL (0 mLs Intravenous Stopped 02/04/21 2019)  fentaNYL (SUBLIMAZE) injection 50 mcg (50 mcg Intravenous Given 02/04/21 1919)  piperacillin-tazobactam (ZOSYN) IVPB 3.375 g (0 g Intravenous Stopped 02/04/21 2053)    ED Course  I have reviewed the triage vital signs and the nursing notes.  Pertinent labs & imaging results that were available during my care of the patient were reviewed by me and considered in my medical decision making (see chart for details).  Clinical Course as of 02/04/21 2122  Fri Feb 04, 2021  1851 Dr. Marcello Moores [BM]  1953 GI [BM]  2013 Hospitalist [BM]    Clinical Course User Index [BM] Gari Crown   MDM Rules/Calculators/A&P  Additional history obtained from: 1. Nursing notes from this visit. 2. Review of Electronic medical records.  Patient seen 02/02/2021 diagnosis biliary colic. ======= I ordered, reviewed and interpreted labs which include: CBC without leukocytosis or thrombocytopenia.  Anemia 10.8 similar to prior. Lipase within normal limits. CMP shows worsening LFTs, bilirubin now 3.7 up from 1.72 days ago.  ALT 333, AST 125, alk phos 284.  No emergent lecture derangement or gap. - On physical examination concern for cholecystitis versus stone.  Awaiting right upper quadrant ultrasound, consult placed to general surgery. - 6:51 PM: Consult with general surgery Dr. Marcello Moores advises GI consultation.  General surgery to see patient tomorrow morning. - RUQ [place]:  IMPRESSION:  Multiple calculi and sludge within the gallbladder.    Minimal gallbladder wall thickening, new  versus previous exam though  no sonographic Murphy sign or pericholecystic fluid are identified.   7:52 PM: Consult with gastroenterology.  Advise to obtain MRCP, if it does not show a stone then to call general surgery back for cholecystectomy. - MRCP ordered, patient reassessed states understanding of care plan is agreeable for admission.  Patient denies history of penicillin allergy.  Chart review shows patient took penicillin 2013 and patient reports no allergic reaction at that time.  I consulted pharmacy spoke with pharmacist [employer] who agrees with plan to give Zosyn at this time. - 8:13 PM: Consult with Dr. Trilby Drummer, patient accepted to hospitalist service.  MRCP pending.  Urinalysis without evidence of infection. Covid/influenza panel negative.   Note: Portions of this report may have been transcribed using voice recognition software. Every effort was made to ensure accuracy; however, inadvertent computerized transcription errors may still be present. Final Clinical Impression(s) / ED Diagnoses Final diagnoses:  RUQ abdominal pain    Rx / DC Orders ED Discharge Orders    None       Gari Crown 02/04/21 2124    Gareth Morgan, MD 02/05/21 [phone]

## 2021-02-04 NOTE — ED Triage Notes (Addendum)
Patient c/o RUQ pain that radiates into the back since waking this AM and a fever of 101.1 . Patient state she took a Percocet and muscle relaxer at 1145 which did not relieve the pain.   Patient states she has nausea all the time.

## 2021-02-05 ENCOUNTER — Observation Stay (HOSPITAL_COMMUNITY): Payer: BC Managed Care – PPO | Admitting: Certified Registered"

## 2021-02-05 ENCOUNTER — Encounter (HOSPITAL_COMMUNITY)
Admission: EM | Disposition: A | Discharge status: Home / Self Care | Payer: Self-pay | Source: Home / Self Care | Attending: Internal Medicine

## 2021-02-05 ENCOUNTER — Observation Stay (HOSPITAL_COMMUNITY): Payer: BC Managed Care – PPO

## 2021-02-05 ENCOUNTER — Encounter (HOSPITAL_COMMUNITY): Payer: Self-pay | Admitting: Internal Medicine

## 2021-02-05 DIAGNOSIS — K810 Acute cholecystitis: Secondary | ICD-10-CM | POA: Diagnosis not present

## 2021-02-05 DIAGNOSIS — K219 Gastro-esophageal reflux disease without esophagitis: Secondary | ICD-10-CM

## 2021-02-05 DIAGNOSIS — F419 Anxiety disorder, unspecified: Secondary | ICD-10-CM | POA: Diagnosis not present

## 2021-02-05 DIAGNOSIS — I10 Essential (primary) hypertension: Secondary | ICD-10-CM

## 2021-02-05 HISTORY — PX: CHOLECYSTECTOMY: SHX55

## 2021-02-05 LAB — COMPREHENSIVE METABOLIC PANEL
ALT: 221 U/L — ABNORMAL HIGH (ref 0–44)
AST: 62 U/L — ABNORMAL HIGH (ref 15–41)
Albumin: 3 g/dL — ABNORMAL LOW (ref 3.5–5.0)
Alkaline Phosphatase: 218 U/L — ABNORMAL HIGH (ref 38–126)
Anion gap: 8 (ref 5–15)
BUN: 17 mg/dL (ref 6–20)
CO2: 24 mmol/L (ref 22–32)
Calcium: 8.4 mg/dL — ABNORMAL LOW (ref 8.9–10.3)
Chloride: 103 mmol/L (ref 98–111)
Creatinine, Ser: 1.1 mg/dL — ABNORMAL HIGH (ref 0.44–1.00)
GFR, Estimated: 59 mL/min — ABNORMAL LOW (ref >60)
Glucose, Bld: 107 mg/dL — ABNORMAL HIGH (ref 70–99)
Potassium: 3.6 mmol/L (ref 3.5–5.1)
Sodium: 135 mmol/L (ref 135–145)
Total Bilirubin: 2.9 mg/dL — ABNORMAL HIGH (ref 0.3–1.2)
Total Protein: 6.4 g/dL — ABNORMAL LOW (ref 6.5–8.1)

## 2021-02-05 LAB — CBC
HCT: 30 % — ABNORMAL LOW (ref 36.0–46.0)
Hemoglobin: 9.1 g/dL — ABNORMAL LOW (ref 12.0–15.0)
MCH: 26.1 pg (ref 26.0–34.0)
MCHC: 30.3 g/dL (ref 30.0–36.0)
MCV: 86.2 fL (ref 80.0–100.0)
Platelets: 185 10*3/uL (ref 150–400)
RBC: 3.48 MIL/uL — ABNORMAL LOW (ref 3.87–5.11)
RDW: 13.3 % (ref 11.5–15.5)
WBC: 6.7 10*3/uL (ref 4.0–10.5)
nRBC: 0 % (ref 0.0–0.2)

## 2021-02-05 LAB — HIV ANTIBODY (ROUTINE TESTING W REFLEX): HIV Screen 4th Generation wRfx: NONREACTIVE

## 2021-02-05 SURGERY — LAPAROSCOPIC CHOLECYSTECTOMY WITH INTRAOPERATIVE CHOLANGIOGRAM
Anesthesia: General

## 2021-02-05 MED ORDER — MIDAZOLAM HCL 5 MG/5ML IJ SOLN
INTRAMUSCULAR | Status: DC | PRN
  Administered 2021-02-05: 2 mg via INTRAVENOUS

## 2021-02-05 MED ORDER — FENTANYL CITRATE (PF) 100 MCG/2ML IJ SOLN
INTRAMUSCULAR | Status: DC | PRN
  Administered 2021-02-05 (×4): 50 ug via INTRAVENOUS

## 2021-02-05 MED ORDER — LIDOCAINE 2% (20 MG/ML) 5 ML SYRINGE
INTRAMUSCULAR | Status: DC | PRN
  Administered 2021-02-05: 100 mg via INTRAVENOUS

## 2021-02-05 MED ORDER — ALBUMIN HUMAN 5 % IV SOLN
INTRAVENOUS | Status: AC
  Filled 2021-02-05: qty 250

## 2021-02-05 MED ORDER — MEPERIDINE HCL 50 MG/ML IJ SOLN: 6.2500 mg | INTRAMUSCULAR | Status: DC | PRN

## 2021-02-05 MED ORDER — BUPIVACAINE HCL (PF) 0.5 % IJ SOLN
INTRAMUSCULAR | Status: AC
  Filled 2021-02-05: qty 30

## 2021-02-05 MED ORDER — FENTANYL CITRATE (PF) 250 MCG/5ML IJ SOLN
INTRAMUSCULAR | Status: AC
  Filled 2021-02-05: qty 5

## 2021-02-05 MED ORDER — ALBUMIN HUMAN 5 % IV SOLN: INTRAVENOUS | Status: DC | PRN

## 2021-02-05 MED ORDER — PROPOFOL 10 MG/ML IV BOLUS
INTRAVENOUS | Status: DC | PRN
  Administered 2021-02-05: 200 mg via INTRAVENOUS

## 2021-02-05 MED ORDER — FENTANYL CITRATE (PF) 100 MCG/2ML IJ SOLN
INTRAMUSCULAR | Status: AC
  Filled 2021-02-05: qty 2

## 2021-02-05 MED ORDER — PROPOFOL 10 MG/ML IV BOLUS
INTRAVENOUS | Status: AC
  Filled 2021-02-05: qty 20

## 2021-02-05 MED ORDER — PIPERACILLIN-TAZOBACTAM 3.375 G IVPB
INTRAVENOUS | Status: AC
  Administered 2021-02-05: 3.375 g via INTRAVENOUS
  Filled 2021-02-05: qty 50

## 2021-02-05 MED ORDER — MIDAZOLAM HCL 2 MG/2ML IJ SOLN
INTRAMUSCULAR | Status: AC
  Filled 2021-02-05: qty 2

## 2021-02-05 MED ORDER — ALBUMIN HUMAN 5 % IV SOLN
INTRAVENOUS | Status: AC
  Filled 2021-02-05: qty 750

## 2021-02-05 MED ORDER — MORPHINE SULFATE (PF) 4 MG/ML IV SOLN: 2.0000 mg | INTRAVENOUS | Status: DC | PRN

## 2021-02-05 MED ORDER — FENTANYL CITRATE (PF) 100 MCG/2ML IJ SOLN
25.0000 ug | INTRAMUSCULAR | Status: DC | PRN
  Administered 2021-02-05 (×2): 50 ug via INTRAVENOUS

## 2021-02-05 MED ORDER — SUCCINYLCHOLINE CHLORIDE 200 MG/10ML IV SOSY
PREFILLED_SYRINGE | INTRAVENOUS | Status: DC | PRN
  Administered 2021-02-05: 160 mg via INTRAVENOUS

## 2021-02-05 MED ORDER — PHENYLEPHRINE 40 MCG/ML (10ML) SYRINGE FOR IV PUSH (FOR BLOOD PRESSURE SUPPORT)
PREFILLED_SYRINGE | INTRAVENOUS | Status: AC
  Filled 2021-02-05: qty 10

## 2021-02-05 MED ORDER — ONDANSETRON HCL 4 MG/2ML IJ SOLN
INTRAMUSCULAR | Status: DC | PRN
  Administered 2021-02-05: 4 mg via INTRAVENOUS

## 2021-02-05 MED ORDER — PROMETHAZINE HCL 25 MG/ML IJ SOLN: 6.2500 mg | INTRAMUSCULAR | Status: DC | PRN

## 2021-02-05 MED ORDER — DEXAMETHASONE SODIUM PHOSPHATE 10 MG/ML IJ SOLN
INTRAMUSCULAR | Status: DC | PRN
  Administered 2021-02-05: 5 mg via INTRAVENOUS

## 2021-02-05 MED ORDER — ROCURONIUM BROMIDE 100 MG/10ML IV SOLN
INTRAVENOUS | Status: DC | PRN
  Administered 2021-02-05: 40 mg via INTRAVENOUS
  Administered 2021-02-05: 20 mg via INTRAVENOUS
  Administered 2021-02-05: 10 mg via INTRAVENOUS

## 2021-02-05 MED ORDER — ACETAMINOPHEN 10 MG/ML IV SOLN: 1000.0000 mg | Freq: Once | INTRAVENOUS | Status: DC | PRN

## 2021-02-05 MED ORDER — LACTATED RINGERS IV SOLN: INTRAVENOUS | Status: DC | PRN

## 2021-02-05 MED ORDER — PHENYLEPHRINE 40 MCG/ML (10ML) SYRINGE FOR IV PUSH (FOR BLOOD PRESSURE SUPPORT)
PREFILLED_SYRINGE | INTRAVENOUS | Status: DC | PRN
  Administered 2021-02-05 (×2): 120 ug via INTRAVENOUS
  Administered 2021-02-05: 80 ug via INTRAVENOUS
  Administered 2021-02-05: 120 ug via INTRAVENOUS

## 2021-02-05 MED ORDER — OXYCODONE HCL 5 MG PO TABS
5.0000 mg | ORAL_TABLET | ORAL | Status: DC | PRN
  Administered 2021-02-05: 5 mg via ORAL
  Administered 2021-02-05 – 2021-02-07 (×6): 10 mg via ORAL
  Filled 2021-02-05 (×4): qty 2
  Filled 2021-02-05: qty 1
  Filled 2021-02-05 (×2): qty 2

## 2021-02-05 MED ORDER — SUGAMMADEX SODIUM 200 MG/2ML IV SOLN
INTRAVENOUS | Status: DC | PRN
  Administered 2021-02-05: 250 mg via INTRAVENOUS

## 2021-02-05 MED ORDER — ONDANSETRON HCL 4 MG/2ML IJ SOLN
INTRAMUSCULAR | Status: AC
  Filled 2021-02-05: qty 4

## 2021-02-05 MED ORDER — LIDOCAINE 2% (20 MG/ML) 5 ML SYRINGE
INTRAMUSCULAR | Status: AC
  Filled 2021-02-05: qty 5

## 2021-02-05 MED ORDER — IOHEXOL 300 MG/ML  SOLN
INTRAMUSCULAR | Status: DC | PRN
  Administered 2021-02-05: 20 mL

## 2021-02-05 MED ORDER — DEXAMETHASONE SODIUM PHOSPHATE 10 MG/ML IJ SOLN
INTRAMUSCULAR | Status: AC
  Filled 2021-02-05: qty 1

## 2021-02-05 MED ORDER — BUPIVACAINE HCL (PF) 0.5 % IJ SOLN
INTRAMUSCULAR | Status: DC | PRN
  Administered 2021-02-05: 20 mL

## 2021-02-05 SURGICAL SUPPLY — 34 items
ADH SKN CLS APL DERMABOND .7 (GAUZE/BANDAGES/DRESSINGS) ×1
APL PRP STRL LF DISP 70% ISPRP (MISCELLANEOUS) ×1
APPLIER CLIP 5 13 M/L LIGAMAX5 (MISCELLANEOUS) ×2
APR CLP MED LRG 5 ANG JAW (MISCELLANEOUS) ×1
BAG SPEC RTRVL LRG 6X4 10 (ENDOMECHANICALS) ×1
CABLE HIGH FREQUENCY MONO STRZ (ELECTRODE) ×2 IMPLANT
CHLORAPREP W/TINT 26 (MISCELLANEOUS) ×2 IMPLANT
CLIP APPLIE 5 13 M/L LIGAMAX5 (MISCELLANEOUS) ×1 IMPLANT
COVER MAYO STAND STRL (DRAPES) IMPLANT
COVER WAND RF STERILE (DRAPES) IMPLANT
DECANTER SPIKE VIAL GLASS SM (MISCELLANEOUS) ×2 IMPLANT
DERMABOND ADVANCED (GAUZE/BANDAGES/DRESSINGS) ×1
DERMABOND ADVANCED .7 DNX12 (GAUZE/BANDAGES/DRESSINGS) ×1 IMPLANT
DRAPE C-ARM 42X120 X-RAY (DRAPES) IMPLANT
ELECT REM PT RETURN 15FT ADLT (MISCELLANEOUS) ×2 IMPLANT
GLOVE SURG ENC MOIS LTX SZ7.5 (GLOVE) ×2 IMPLANT
GOWN STRL REUS W/TWL XL LVL3 (GOWN DISPOSABLE) ×4 IMPLANT
HEMOSTAT SNOW SURGICEL 2X4 (HEMOSTASIS) ×2 IMPLANT
HEMOSTAT SURGICEL 4X8 (HEMOSTASIS) IMPLANT
KIT BASIN OR (CUSTOM PROCEDURE TRAY) ×2 IMPLANT
KIT TURNOVER KIT A (KITS) ×2 IMPLANT
PENCIL SMOKE EVACUATOR (MISCELLANEOUS) IMPLANT
POUCH SPECIMEN RETRIEVAL 10MM (ENDOMECHANICALS) ×2 IMPLANT
SCISSORS LAP 5X35 DISP (ENDOMECHANICALS) ×2 IMPLANT
SET CHOLANGIOGRAPH MIX (MISCELLANEOUS) IMPLANT
SET IRRIG TUBING LAPAROSCOPIC (IRRIGATION / IRRIGATOR) ×2 IMPLANT
SET TUBE SMOKE EVAC HIGH FLOW (TUBING) ×2 IMPLANT
SLEEVE XCEL OPT CAN 5 100 (ENDOMECHANICALS) ×4 IMPLANT
SUT MNCRL AB 4-0 PS2 18 (SUTURE) ×2 IMPLANT
TOWEL OR 17X26 10 PK STRL BLUE (TOWEL DISPOSABLE) ×2 IMPLANT
TOWEL OR NON WOVEN STRL DISP B (DISPOSABLE) ×2 IMPLANT
TRAY LAPAROSCOPIC (CUSTOM PROCEDURE TRAY) ×2 IMPLANT
TROCAR BLADELESS OPT 5 100 (ENDOMECHANICALS) ×2 IMPLANT
TROCAR XCEL BLUNT TIP 100MML (ENDOMECHANICALS) ×2 IMPLANT

## 2021-02-05 NOTE — Consult Note (Signed)
UNASSIGNED PATIENT CROSS COVER [facility] Reason for Consult: Abnormal LFT's ?need for ERCP. Referring Physician: THP.  Gwendolyn Russell is an 55 y.o. female.  HPI: Gwendolyn Russell is a 55 year old, black female, with multiple problems listed below who presented to the emergency room at [facility] with right upper quadrant pain associate with nausea and vomiting for the last several weeks.  She was in the ER for the same problem 14 days ago and was sent home after IV hydration.  Yesterday about 6 AM she developed severe worsening of her abdominal pain radiating to her back from the right upper quadrant prompting her to come back to the emergency room. She gives a history of a fever of 101.1 F at home. She had been nauseated all day but had not vomited.  In the ER she had a right upper quadrant ultrasound revealed minimal thickening of the gallbladder with multiple calculi and sludge within the gallbladder lumen and increase in liver echogenicity consistent with fatty liver.  MRCP done on admission revealed numerous cholelithiasis and biliary sludge within the lumen of a mildly distended gallbladder measuring 3.2 cm with wall thickening of 4 mm slightly increased in comparison to previous studies.  There was trace pericholecystic fluid noted.  There is description of layering low-density debris in the common bile duct reflecting sludge without ductal dilatation or visualize choledocholithiasis.  Mild diffuse hepatic steatosis was also described in changes of the sleeve gastrectomy mild thickening of the gastric antral wall was noted.  Patient was taken to the OR this morning had a laparoscopic cholecystectomy with a failed intraoperative cholangiogram.  Her alkaline phosphatase on admission was 284 with a AST of 125 and ALT of 333 and a total bilirubin of 3.7.  Today her total bilirubin is 2.9 with an alkaline phosphatase of 218 AST of 62 and ALT of 221.  Hemoglobin is trended down from 10.8 g/dL yesterday  to 9.1 g/dL today.  Past Medical History:  Diagnosis Date  . Abnormal Pap smear   .  History of iron deficiency anemia   . Anxiety disorder   . Arthritis    hips, knees, lower back  . Chronic back pain   . Depression   . DVT of leg (deep venous thrombosis), left   . GERD (gastroesophageal reflux disease)   . Headache   . Hypertension    Hx weight loss surgery, no current meds  . Insomnia   . Morbid obesity status post gastric sleeve   . PONV (postoperative nausea and vomiting)   . 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 Spinal stenosis   . SVD (spontaneous vaginal delivery) x 2       Past Surgical History:  Procedure Laterality Date  . ABDOMINAL HYSTERECTOMY    . BUNIONECTOMY Bilateral   . CARPAL TUNNEL RELEASE Left   . COLPOSCOPY    . LAPAROSCOPIC GASTRIC SLEEVE RESECTION  10/11/15  . LASER ABLATION CONDYLOMA CERVICAL / VULVAR    . LEEP    . MENISCUS REPAIR Right   . TONSILLECTOMY    . TUBAL LIGATION    . VAGINAL HYSTERECTOMY N/A 02/08/2016   Procedure: HYSTERECTOMY VAGINAL;  Surgeon: Woodroe Mode, MD;  Location: [facility];  Service: Gynecology;  Laterality: N/A;   Family History  Problem Relation Age of Onset  . Heart disease Maternal Grandmother   . Diabetes Father   . Heart disease Father   . Deep vein  thrombosis Father   . Early death Mother   . Breast cancer Sister   . Diabetes Sister    Social History:  reports that she has never smoked. She has never used smokeless tobacco. She reports that she does not drink alcohol and does not use drugs.  Allergies:  Allergies  Allergen Reactions  . Nsaids     Other reaction(s): Other Due to Gastric Bypass   . Tolmetin     Other reaction(s): Other (See Comments) Due to Gastric Bypass  . Zonisamide Rash  . Cephalosporins Hives  . Ciprofloxacin   . Clindamycin Itching  . Tdap [Tetanus-Diphth-Acell Pertussis]   . Latex Rash  . Sulfa Antibiotics Rash  . Tape Rash     Adhesive tape   Medications: I have reviewed the patient's current medications.  Results for orders placed or performed during the hospital encounter of 02/04/21 (from the past 48 hour(s))  Lipase, blood     Status: None   Collection Time: 02/04/21  2:11 PM  Result Value Ref Range   Lipase 19 11 - 51 U/L    Comment: Performed at [facility], [place] [address] [place], [place] [address]  Comprehensive metabolic panel     Status: Abnormal   Collection Time: 02/04/21  2:11 PM  Result Value Ref Range   Sodium 136 135 - 145 mmol/L   Potassium 3.9 3.5 - 5.1 mmol/L   Chloride 100 98 - 111 mmol/L   CO2 23 22 - 32 mmol/L   Glucose, Bld 132 (H) 70 - 99 mg/dL    Comment: Glucose reference range applies only to samples taken after fasting for at least 8 hours.   BUN 25 (H) 6 - 20 mg/dL   Creatinine, Ser 1.47 (H) 0.44 - 1.00 mg/dL   Calcium 8.9 8.9 - 10.3 mg/dL   Total Protein 7.6 6.5 - 8.1 g/dL   Albumin 3.6 3.5 - 5.0 g/dL   AST 125 (H) 15 - 41 U/L   ALT 333 (H) 0 - 44 U/L   Alkaline Phosphatase 284 (H) 38 - 126 U/L   Total Bilirubin 3.7 (H) 0.3 - 1.2 mg/dL   GFR, Estimated 42 (L) >60 mL/min    Comment: (NOTE) Calculated using the CKD-EPI Creatinine Equation (2021)    Anion gap 13 5 - 15    Comment: Performed at [facility], [place] [address] [place], [place] [address]  CBC     Status: Abnormal   Collection Time: 02/04/21  2:11 PM  Result Value Ref Range   WBC 6.6 4.0 - 10.5 K/uL   RBC 4.05 3.87 - 5.11 MIL/uL   Hemoglobin 10.8 (L) 12.0 - 15.0 g/dL   HCT 35.2 (L) 36.0 - 46.0 %   MCV 86.9 80.0 - 100.0 fL   MCH 26.7 26.0 - 34.0 pg   MCHC 30.7 30.0 - 36.0 g/dL   RDW 13.2 11.5 - 15.5 %   Platelets 237 150 - 400 K/uL   nRBC 0.0 0.0 - 0.2 %    Comment: Performed at [facility], [place] [address], [place], [place] [address]  Urinalysis, Routine w reflex microscopic     Status: Abnormal   Collection Time: 02/04/21  2:11 PM  Result  Value Ref Range   Color, Urine AMBER (A) YELLOW    Comment: BIOCHEMICALS MAY BE AFFECTED BY COLOR   APPearance CLEAR CLEAR   Specific Gravity, Urine 1.019 1.005 - 1.030   pH 5.0 5.0 - 8.0  Glucose, UA NEGATIVE NEGATIVE mg/dL   Hgb urine dipstick NEGATIVE NEGATIVE   Bilirubin Urine SMALL (A) NEGATIVE   Ketones, ur NEGATIVE NEGATIVE mg/dL   Protein, ur NEGATIVE NEGATIVE mg/dL   Nitrite NEGATIVE NEGATIVE   Leukocytes,Ua NEGATIVE NEGATIVE    Comment: Performed at [facility] [address], [place], [place] [address]  Resp Panel by RT-PCR (Flu A&B, Covid) Nasopharyngeal Swab     Status: None   Collection Time: 02/04/21  6:43 PM   Specimen: Nasopharyngeal Swab; Nasopharyngeal(NP) swabs in vial transport medium  Result Value Ref Range   SARS Coronavirus 2 by RT PCR NEGATIVE NEGATIVE    Comment: (NOTE) SARS-CoV-2 target nucleic acids are NOT DETECTED.  The SARS-CoV-2 RNA is generally detectable in upper respiratory specimens during the acute phase of infection. The lowest concentration of SARS-CoV-2 viral copies this assay can detect is 138 copies/mL. A negative result does not preclude SARS-Cov-2 infection and should not be used as the sole basis for treatment or other patient management decisions. A negative result may occur with  improper specimen collection/handling, submission of specimen other than nasopharyngeal swab, presence of viral mutation(s) within the areas targeted by this assay, and inadequate number of viral copies(<138 copies/mL). A negative result must be combined with clinical observations, patient history, and epidemiological information. The expected result is Negative.  Fact Sheet for Patients:  EntrepreneurPulse.com.au  Fact Sheet for Healthcare Providers:  IncredibleEmployment.be  This test is no t yet approved or cleared by the [place] FDA and  has been authorized for detection and/or diagnosis  of SARS-CoV-2 by FDA under an Emergency Use Authorization (EUA). This EUA will remain  in effect (meaning this test can be used) for the duration of the COVID-19 declaration under Section 564(b)(1) of the Act, 21 U.S.C.section 360bbb-3(b)(1), unless the authorization is terminated  or revoked sooner.       Influenza A by PCR NEGATIVE NEGATIVE   Influenza B by PCR NEGATIVE NEGATIVE    Comment: (NOTE) The Xpert Xpress SARS-CoV-2/FLU/RSV plus assay is intended as an aid in the diagnosis of influenza from Nasopharyngeal swab specimens and should not be used as a sole basis for treatment. Nasal washings and aspirates are unacceptable for Xpert Xpress SARS-CoV-2/FLU/RSV testing.  Fact Sheet for Patients: EntrepreneurPulse.com.au  Fact Sheet for Healthcare Providers: IncredibleEmployment.be  This test is not yet approved or cleared by the [place] FDA and has been authorized for detection and/or diagnosis of SARS-CoV-2 by FDA under an Emergency Use Authorization (EUA). This EUA will remain in effect (meaning this test can be used) for the duration of the COVID-19 declaration under Section 564(b)(1) of the Act, 21 U.S.C. section 360bbb-3(b)(1), unless the authorization is terminated or revoked.  Performed at [facility], [place] [address] [place], [place] [address]   Comprehensive metabolic panel     Status: Abnormal   Collection Time: 02/05/21  6:04 AM  Result Value Ref Range   Sodium 135 135 - 145 mmol/L   Potassium 3.6 3.5 - 5.1 mmol/L   Chloride 103 98 - 111 mmol/L   CO2 24 22 - 32 mmol/L   Glucose, Bld 107 (H) 70 - 99 mg/dL    Comment: Glucose reference range applies only to samples taken after fasting for at least 8 hours.   BUN 17 6 - 20 mg/dL   Creatinine, Ser 1.10 (H) 0.44 - 1.00 mg/dL   Calcium 8.4 (L) 8.9 - 10.3 mg/dL   Total Protein 6.4 (L) 6.5 -  8.1 g/dL   Albumin 3.0 (L) 3.5 - 5.0 g/dL   AST 62 (H) 15 -  41 U/L   ALT 221 (H) 0 - 44 U/L   Alkaline Phosphatase 218 (H) 38 - 126 U/L   Total Bilirubin 2.9 (H) 0.3 - 1.2 mg/dL   GFR, Estimated 59 (L) >60 mL/min    Comment: (NOTE) Calculated using the CKD-EPI Creatinine Equation (2021)    Anion gap 8 5 - 15    Comment: Performed at [facility], [place] [address], [place], [place] [address]  CBC     Status: Abnormal   Collection Time: 02/05/21  6:04 AM  Result Value Ref Range   WBC 6.7 4.0 - 10.5 K/uL   RBC 3.48 (L) 3.87 - 5.11 MIL/uL   Hemoglobin 9.1 (L) 12.0 - 15.0 g/dL   HCT 30.0 (L) 36.0 - 46.0 %   MCV 86.2 80.0 - 100.0 fL   MCH 26.1 26.0 - 34.0 pg   MCHC 30.3 30.0 - 36.0 g/dL   RDW 13.3 11.5 - 15.5 %   Platelets 185 150 - 400 K/uL   nRBC 0.0 0.0 - 0.2 %    Comment: Performed at [facility], [place] [address] [place], [place] [address]    DG Cholangiogram Operative  Result Date: 02/05/2021 CLINICAL DATA:  Cholelithiasis EXAM: INTRAOPERATIVE CHOLANGIOGRAM TECHNIQUE: Cholangiographic images from the C-arm fluoroscopic device were submitted for interpretation post-operatively. Please see the procedural report for the amount of contrast and the fluoroscopy time utilized. COMPARISON:  MRCP 02/04/2021 FLUOROSCOPY TIME:  0 minutes 25 seconds Dose: 25.70 mGy FINDINGS: Two intraoperative imaging series. Common hepatic duct and common bile duct appear normal in caliber. Visualized intrahepatic ducts normal appearance. No filling defects or stricture identified. Spillage of contrast into the duodenum is not demonstrated on the submitted series. Tapering at distal CBD. IMPRESSION: No evidence of choledocholithiasis or CBD stricture on submitted series. No spillage of contrast into the duodenum is present on the submitted series to confirm distal CBD patency. Electronically Signed   By: Lavonia Dana M.D.   On: 02/05/2021 12:25   MR 3D Recon At Scanner  Result Date: 02/04/2021 CLINICAL DATA:  Right upper quadrant  abdominal pain, ultrasound nondiagnostic. EXAM: MRI ABDOMEN WITHOUT AND WITH CONTRAST (INCLUDING MRCP) TECHNIQUE: Multiplanar multisequence MR imaging of the abdomen was performed both before and after the administration of intravenous contrast. Heavily T2-weighted images of the biliary and pancreatic ducts were obtained, and three-dimensional MRCP images were rendered by post processing. CONTRAST:  71mL GADAVIST GADOBUTROL 1 MMOL/ML IV SOLN COMPARISON:  Multiple priors including same-day ultrasound, CT abdomen pelvis December 15, 2014, right upper quadrant ultrasound November 28, 2014 and HIDA scan January 01, 2015. FINDINGS: Lower chest: No acute abnormality. Hepatobiliary: Mild diffuse hepatic steatosis. No suspicious hepatic lesion. Numerous cholelithiasis and biliary sludge are again visualized within the lumen of a mildly distended gallbladder measuring up to 3.2 cm. There is mild wall thickening measuring up to 4 mm, slightly increased wall thickness in comparison to studies from 2016 when it measured upper limits of normal at 3 mm. There is trace pericholecystic fluid. No biliary ductal dilation with the common duct measuring 5.6 mm. There is layering low-density debris in the common bile duct without visualized choledocholithiasis. Pancreas: No mass, inflammatory changes, or other parenchymal abnormality identified. Spleen:  Within normal limits in size and appearance. Adrenals/Urinary Tract: No masses identified. No evidence of hydronephrosis. Stomach/Bowel: Changes of sleeve gastrectomy with mild thickening of  the wall of the gastric antrum, which is at least in part accentuated by under distension. No evidence of obstruction. Colonic diverticulosis. Vascular/Lymphatic: No pathologically enlarged lymph nodes identified. No abdominal aortic aneurysm demonstrated. Other:  None. Musculoskeletal: No suspicious bone lesions identified. IMPRESSION: 1. Numerous cholelithiasis and biliary sludge are again  visualized within the lumen of a mildly distended gallbladder measuring up to 3.2 cm, with mild wall thickening measuring up to 4 mm. Slightly increased in comparison to studies from 2016 when it measured upper limits of normal at 3 mm. There is trace pericholecystic fluid. These findings are equivocal for acute cholecystitis. Suggest further evaluation with nuclear medicine HIDA scan to assess for cystic duct patency. 2. There is layering low-density debris in the common bile duct, likely reflecting biliary sludge without biliary ductal dilation or visualized choledocholithiasis. 3. Mild diffuse hepatic steatosis. 4. 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 Electronically Signed   By: Dahlia Bailiff MD   On: 02/04/2021 22:51   MR ABDOMEN MRCP W WO CONTAST  Result Date: 02/04/2021 CLINICAL DATA:  Right upper quadrant abdominal pain, ultrasound nondiagnostic. EXAM: MRI ABDOMEN WITHOUT AND WITH CONTRAST (INCLUDING MRCP) TECHNIQUE: Multiplanar multisequence MR imaging of the abdomen was performed both before and after the administration of intravenous contrast. Heavily T2-weighted images of the biliary and pancreatic ducts were obtained, and three-dimensional MRCP images were rendered by post processing. CONTRAST:  69mL GADAVIST GADOBUTROL 1 MMOL/ML IV SOLN COMPARISON:  Multiple priors including same-day ultrasound, CT abdomen pelvis December 15, 2014, right upper quadrant ultrasound November 28, 2014 and HIDA scan January 01, 2015. FINDINGS: Lower chest: No acute abnormality. Hepatobiliary: Mild diffuse hepatic steatosis. No suspicious hepatic lesion. Numerous cholelithiasis and biliary sludge are again visualized within the lumen of a mildly distended gallbladder measuring up to 3.2 cm. There is mild wall thickening measuring up to 4 mm, slightly increased wall thickness in comparison  to studies from 2016 when it measured upper limits of normal at 3 mm. There is trace pericholecystic fluid. No biliary ductal dilation with the common duct measuring 5.6 mm. There is layering low-density debris in the common bile duct without visualized choledocholithiasis. Pancreas: No mass, inflammatory changes, or other parenchymal abnormality identified. Spleen:  Within normal limits in size and appearance. Adrenals/Urinary Tract: No masses identified. No evidence of hydronephrosis. 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 No evidence of obstruction. Colonic diverticulosis. Vascular/Lymphatic: No pathologically enlarged lymph nodes identified. No abdominal aortic aneurysm demonstrated. Other:  None. Musculoskeletal: No suspicious bone lesions identified. IMPRESSION: 1. Numerous cholelithiasis and biliary sludge are again visualized within the lumen of a mildly distended gallbladder measuring up to 3.2 cm, with mild wall thickening measuring up to 4 mm. Slightly increased in comparison to studies from 2016 when it measured upper limits of normal at 3 mm. There is trace pericholecystic fluid. These findings are equivocal for acute cholecystitis. Suggest further evaluation with nuclear medicine HIDA scan to assess for cystic duct patency. 2. There is layering low-density debris in the common bile duct, likely reflecting biliary sludge without biliary ductal dilation or visualized choledocholithiasis. 3. Mild diffuse hepatic steatosis. 4. Changes of sleeve gastrectomy with mild thickening of the wall of the gastric antrum, which is at least in part accentuated by under distension, but  may reflect gastritis. Electronically Signed   By: Dahlia Bailiff MD   On: 02/04/2021 22:51   US Abdomen Limited RUQ (LIVER/GB)  Result Date: 02/04/2021 CLINICAL DATA:  RIGHT upper quadrant pain EXAM: ULTRASOUND  ABDOMEN LIMITED RIGHT UPPER QUADRANT COMPARISON:  02/02/2021 FINDINGS: Gallbladder: Multiple shadowing calculi within gallbladder up to 13 mm diameter. Small amount of accompanying gallbladder sludge. Gallbladder wall minimally thickened 4 mm diameter, slightly increased from previous exam. No sonographic Murphy sign or pericholecystic fluid Common bile duct: Diameter: 6 mm, upper normal, unchanged Liver: Minimally increased echogenicity with smooth margins question fatty infiltration. No mass or nodularity. No intrahepatic biliary dilatation. Portal vein is patent on color Doppler imaging with normal direction of blood flow towards the liver. Other: No RIGHT upper quadrant free fluid. IMPRESSION: Multiple calculi and sludge within the gallbladder. Minimal gallbladder wall thickening, new versus previous exam though no sonographic Murphy sign or pericholecystic fluid are identified. Electronically Signed   By: Lavonia Dana M.D.   On: 02/04/2021 19:22   Review of Systems  Constitutional: Positive for fever. Negative for activity change, appetite change, chills, diaphoresis, fatigue and unexpected weight change.  HENT: Negative.   Eyes: Negative.   Respiratory: Negative.   Cardiovascular: Negative.   Gastrointestinal: Positive for abdominal pain and nausea. Negative for abdominal distention, anal bleeding, blood in stool, constipation, diarrhea and rectal pain.  Endocrine: Negative.   Genitourinary: Negative.   Musculoskeletal: Positive for arthralgias and joint swelling. Negative for back pain, gait problem, myalgias, neck pain and neck stiffness.  Skin: Negative.   Allergic/Immunologic: Negative.   Neurological: Negative.   Hematological: Negative.   Psychiatric/Behavioral: Negative.    Blood pressure 136/83, pulse 98, temperature 99.4 F (37.4 C), resp. rate (!) 26, height 5\' 6"  (1.676 m), weight 111.1 kg, last menstrual period 08/25/2015, SpO2 100 %. Physical Exam Constitutional:       Appearance: She is well-developed. She is not ill-appearing, toxic-appearing or diaphoretic.  HENT:     Head: Normocephalic and atraumatic.  Eyes:     Extraocular Movements: Extraocular movements intact.     Pupils: Pupils are equal, round, and reactive to light.  Cardiovascular:     Rate and Rhythm: Normal rate and regular rhythm.     Heart sounds: Normal heart sounds.  Pulmonary:     Breath sounds: Normal breath sounds.  Abdominal:     General: Abdomen is protuberant. Bowel sounds are decreased. There is no distension or abdominal bruit.     Palpations: Abdomen is soft.     Tenderness: There is generalized abdominal tenderness.     Hernia: No hernia is present. There is no hernia in the right inguinal area.  Skin:    General: Skin is warm and dry.  Neurological:     Mental Status: She is alert.   Assessment/Plan: 1) Cholelithiasis with gallbladder sludge and thickening of the gallbladder wall consistent with acute cholecystitis-elevated liver enzymes patient is status post laparoscopic cholecystectomy with a failed intraoperative cholangiogram. There is some question of layering of the sludge in the CBD which does not seem to be dilated. LFTs seem to be improving therefore we will continue to monitor LFTs monitor LFTs and make further decisions with regards to ERCP over the next couple of days. 2) GERD on Pantoprazole. 3) Colonic diverticulosis on CT scan 4) Abnormal thickening of the gastric antrum on CT scan ? gastritis 5) Iron deficiency anemia-patient will need work-up for this when she recovers from her surgery with possible EGD and  colonoscopy. 6) History of anxiety and depression. 7) Chronic back pain. 8) Morbid obesity. 9) History of DVT. Juanita Craver 02/05/2021, 12:48 PM

## 2021-02-05 NOTE — Anesthesia Preprocedure Evaluation (Signed)
Anesthesia Evaluation  Patient identified by MRN, date of birth, ID band Patient awake    Reviewed: Allergy & Precautions, NPO status , Patient's Chart, lab work & pertinent test results  History of Anesthesia Complications (+) PONV and history of anesthetic complications  Airway Mallampati: II       Dental no notable dental hx.    Pulmonary neg pulmonary ROS,    Pulmonary exam normal        Cardiovascular hypertension, Pt. on medications Normal cardiovascular exam Rhythm:Regular Rate:Normal     Neuro/Psych  Headaches, PSYCHIATRIC DISORDERS Anxiety Depression  Neuromuscular disease    GI/Hepatic GERD  Medicated,  Endo/Other  Morbid obesity  Renal/GU Renal InsufficiencyRenal disease  negative genitourinary   Musculoskeletal   Abdominal (+) + obese,   Peds  Hematology  (+) Blood dyscrasia, anemia ,   Anesthesia Other Findings   Reproductive/Obstetrics                             Anesthesia Physical Anesthesia Plan  ASA: III  Anesthesia Plan: General   Post-op Pain Management:    Induction: Intravenous  PONV Risk Score and Plan: 4 or greater and Ondansetron, Dexamethasone, Midazolam and Scopolamine patch - Pre-op  Airway Management Planned: Oral ETT  Additional Equipment: None  Intra-op Plan:   Post-operative Plan: Extubation in OR  Informed Consent: I have reviewed the patients History and Physical, chart, labs and discussed the procedure including the risks, benefits and alternatives for the proposed anesthesia with the patient or authorized representative who has indicated his/her understanding and acceptance.     Dental advisory given  Plan Discussed with: CRNA  Anesthesia Plan Comments:         Anesthesia Quick Evaluation

## 2021-02-05 NOTE — Consult Note (Signed)
CONSULT  CC: RUQ abdominall pain  Requesting provider: Dr. Earley Favor  HPI: Gwendolyn Russell is an 55 y.o. female who is here for right upper quadrant abdominal pain, gallstones, and possible cholecystitis  She reports ongoing intermittent abdominal pain for several weeks.  She has had recurrent nausea and vomiting.  She has a previous history of a gastric sleeve.  She presented to the emergency department 2 days ago and her liver function tests were slightly elevated at that time.  Her white blood count was normal.  An ultrasound showed gallstones and sludge with some pericholecystic fluid.  She was sent home but returned last evening with worsening abdominal pain.  At this point she had worsening creatinine and a bilirubin of 3.7.  She is still having moderate to severe right upper quadrant abdominal pain.  After rehydration, her creatinine is decreased and her bilirubin is 2.9.  She underwent an MRCP which showed gallstones and a distended gallbladder with mild gallbladder wall thickening and trace pericholecystic fluid.  There was suspected sludge in the bile duct without biliary dilation.  She denies jaundice.  Past Medical History:  Diagnosis Date  . Abnormal Pap smear   . Anemia    Hx  . Anxiety   . Arthritis    hips, knees, lower back  . Chronic back pain   . Depression   . DVT of leg (deep venous thrombosis), left   . GERD (gastroesophageal reflux disease)   . Headache   . Hypertension    Hx weight loss surgery, no current meds  . Insomnia   . Obesity   . PONV (postoperative nausea and vomiting)   . 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 Spinal stenosis   . SVD (spontaneous vaginal delivery)    x 2  . Vaginal delivery 1985, 1988    Past Surgical History:  Procedure Laterality Date  . ABDOMINAL HYSTERECTOMY    . BUNIONECTOMY Bilateral   . CARPAL TUNNEL RELEASE Left   . COLPOSCOPY    . LAPAROSCOPIC GASTRIC SLEEVE RESECTION   10/11/15  . LASER ABLATION CONDYLOMA CERVICAL / VULVAR    . LEEP    . MENISCUS REPAIR Right   . TONSILLECTOMY    . TUBAL LIGATION    . VAGINAL HYSTERECTOMY N/A 02/08/2016   Procedure: HYSTERECTOMY VAGINAL;  Surgeon: Woodroe Mode, MD;  Location: [facility];  Service: Gynecology;  Laterality: N/A;    Family History  Problem Relation Age of Onset  . Heart disease Maternal Grandmother   . Diabetes Father   . Heart disease Father   . Deep vein thrombosis Father   . Early death Mother   . Breast cancer Sister   . Diabetes Sister     Social:  reports that she has never smoked. She has never used smokeless tobacco. She reports that she does not drink alcohol and does not use drugs.  Allergies:  Allergies  Allergen Reactions  . Nsaids     Other reaction(s): Other Due to Gastric Bypass   . Tolmetin     Other reaction(s): Other (See Comments) Due to Gastric Bypass  . Zonisamide Rash  . Cephalosporins Hives  . Ciprofloxacin   . Clindamycin Itching  . Tdap [Tetanus-Diphth-Acell Pertussis]   . Latex Rash  . Sulfa Antibiotics Rash  . Tape Rash    Adhesive tape    Medications: I have reviewed the patient's current medications.  Results for orders  placed or performed during the hospital encounter of 02/04/21 (from the past 48 hour(s))  Lipase, blood     Status: None   Collection Time: 02/04/21  2:11 PM  Result Value Ref Range   Lipase 19 11 - 51 U/L    Comment: Performed at [facility], [place] [address], [place], [place] [address]  Comprehensive metabolic panel     Status: Abnormal   Collection Time: 02/04/21  2:11 PM  Result Value Ref Range   Sodium 136 135 - 145 mmol/L   Potassium 3.9 3.5 - 5.1 mmol/L   Chloride 100 98 - 111 mmol/L   CO2 23 22 - 32 mmol/L   Glucose, Bld 132 (H) 70 - 99 mg/dL    Comment: Glucose reference range applies only to samples taken after fasting for at least 8 hours.   BUN 25 (H) 6 - 20 mg/dL   Creatinine, Ser 1.47 (H) 0.44 -  1.00 mg/dL   Calcium 8.9 8.9 - 10.3 mg/dL   Total Protein 7.6 6.5 - 8.1 g/dL   Albumin 3.6 3.5 - 5.0 g/dL   AST 125 (H) 15 - 41 U/L   ALT 333 (H) 0 - 44 U/L   Alkaline Phosphatase 284 (H) 38 - 126 U/L   Total Bilirubin 3.7 (H) 0.3 - 1.2 mg/dL   GFR, Estimated 42 (L) >60 mL/min    Comment: (NOTE) Calculated using the CKD-EPI Creatinine Equation (2021)    Anion gap 13 5 - 15    Comment: Performed at [facility], [place] [address], [place], [place] [address]  CBC     Status: Abnormal   Collection Time: 02/04/21  2:11 PM  Result Value Ref Range   WBC 6.6 4.0 - 10.5 K/uL   RBC 4.05 3.87 - 5.11 MIL/uL   Hemoglobin 10.8 (L) 12.0 - 15.0 g/dL   HCT 35.2 (L) 36.0 - 46.0 %   MCV 86.9 80.0 - 100.0 fL   MCH 26.7 26.0 - 34.0 pg   MCHC 30.7 30.0 - 36.0 g/dL   RDW 13.2 11.5 - 15.5 %   Platelets 237 150 - 400 K/uL   nRBC 0.0 0.0 - 0.2 %    Comment: Performed at [facility], [place] [address], [place], [place] [address]  Urinalysis, Routine w reflex microscopic     Status: Abnormal   Collection Time: 02/04/21  2:11 PM  Result Value Ref Range   Color, Urine AMBER (A) YELLOW    Comment: BIOCHEMICALS MAY BE AFFECTED BY COLOR   APPearance CLEAR CLEAR   Specific Gravity, Urine 1.019 1.005 - 1.030   pH 5.0 5.0 - 8.0   Glucose, UA NEGATIVE NEGATIVE mg/dL   Hgb urine dipstick NEGATIVE NEGATIVE   Bilirubin Urine SMALL (A) NEGATIVE   Ketones, ur NEGATIVE NEGATIVE mg/dL   Protein, ur NEGATIVE NEGATIVE mg/dL   Nitrite NEGATIVE NEGATIVE   Leukocytes,Ua NEGATIVE NEGATIVE    Comment: Performed at [facility], [place] [address] [place], [place] [address]  Resp Panel by RT-PCR (Flu A&B, Covid) Nasopharyngeal Swab     Status: None   Collection Time: 02/04/21  6:43 PM   Specimen: Nasopharyngeal Swab; Nasopharyngeal(NP) swabs in vial transport medium  Result Value Ref Range   SARS Coronavirus 2 by RT PCR NEGATIVE NEGATIVE    Comment: (NOTE) SARS-CoV-2  target nucleic acids are NOT DETECTED.  The SARS-CoV-2 RNA is generally detectable in upper respiratory specimens during the acute phase of infection. The  lowest concentration of SARS-CoV-2 viral copies this assay can detect is 138 copies/mL. A negative result does not preclude SARS-Cov-2 infection and should not be used as the sole basis for treatment or other patient management decisions. A negative result may occur with  improper specimen collection/handling, submission of specimen other than nasopharyngeal swab, presence of viral mutation(s) within the areas targeted by this assay, and inadequate number of viral copies(<138 copies/mL). A negative result must be combined with clinical observations, patient history, and epidemiological information. The expected result is Negative.  Fact Sheet for Patients:  EntrepreneurPulse.com.au  Fact Sheet for Healthcare Providers:  IncredibleEmployment.be  This test is no t yet approved or cleared by the [place] FDA and  has been authorized for detection and/or diagnosis of SARS-CoV-2 by FDA under an Emergency Use Authorization (EUA). This EUA will remain  in effect (meaning this test can be used) for the duration of the COVID-19 declaration under Section 564(b)(1) of the Act, 21 U.S.C.section 360bbb-3(b)(1), unless the authorization is terminated  or revoked sooner.       Influenza A by PCR NEGATIVE NEGATIVE   Influenza B by PCR NEGATIVE NEGATIVE    Comment: (NOTE) The Xpert Xpress SARS-CoV-2/FLU/RSV plus assay is intended as an aid in the diagnosis of influenza from Nasopharyngeal swab specimens and should not be used as a sole basis for treatment. Nasal washings and aspirates are unacceptable for Xpert Xpress SARS-CoV-2/FLU/RSV testing.  Fact Sheet for Patients: EntrepreneurPulse.com.au  Fact Sheet for Healthcare Providers: IncredibleEmployment.be  This  test is not yet approved or cleared by the [place] FDA and has been authorized for detection and/or diagnosis of SARS-CoV-2 by FDA under an Emergency Use Authorization (EUA). This EUA will remain in effect (meaning this test can be used) for the duration of the COVID-19 declaration under Section 564(b)(1) of the Act, 21 U.S.C. section 360bbb-3(b)(1), unless the authorization is terminated or revoked.  Performed at [facility], [place] [address], [place], [place] [address]   Comprehensive metabolic panel     Status: Abnormal   Collection Time: 02/05/21  6:04 AM  Result Value Ref Range   Sodium 135 135 - 145 mmol/L   Potassium 3.6 3.5 - 5.1 mmol/L   Chloride 103 98 - 111 mmol/L   CO2 24 22 - 32 mmol/L   Glucose, Bld 107 (H) 70 - 99 mg/dL    Comment: Glucose reference range applies only to samples taken after fasting for at least 8 hours.   BUN 17 6 - 20 mg/dL   Creatinine, Ser 1.10 (H) 0.44 - 1.00 mg/dL   Calcium 8.4 (L) 8.9 - 10.3 mg/dL   Total Protein 6.4 (L) 6.5 - 8.1 g/dL   Albumin 3.0 (L) 3.5 - 5.0 g/dL   AST 62 (H) 15 - 41 U/L   ALT 221 (H) 0 - 44 U/L   Alkaline Phosphatase 218 (H) 38 - 126 U/L   Total Bilirubin 2.9 (H) 0.3 - 1.2 mg/dL   GFR, Estimated 59 (L) >60 mL/min    Comment: (NOTE) Calculated using the CKD-EPI Creatinine Equation (2021)    Anion gap 8 5 - 15    Comment: Performed at [facility], [place] [address], [place], [place] [address]  CBC     Status: Abnormal   Collection Time: 02/05/21  6:04 AM  Result Value Ref Range   WBC 6.7 4.0 - 10.5 K/uL   RBC 3.48 (L) 3.87 - 5.11 MIL/uL   Hemoglobin 9.1 (L) 12.0 -  15.0 g/dL   HCT 30.0 (L) 36.0 - 46.0 %   MCV 86.2 80.0 - 100.0 fL   MCH 26.1 26.0 - 34.0 pg   MCHC 30.3 30.0 - 36.0 g/dL   RDW 13.3 11.5 - 15.5 %   Platelets 185 150 - 400 K/uL   nRBC 0.0 0.0 - 0.2 %    Comment: Performed at [facility], [place] [address], [place], [place], [place] [address]    MR 3D  Recon At Scanner  Result Date: 02/04/2021 CLINICAL DATA:  Right upper quadrant abdominal pain, ultrasound nondiagnostic. EXAM: MRI ABDOMEN WITHOUT AND WITH CONTRAST (INCLUDING MRCP) TECHNIQUE: Multiplanar multisequence MR imaging of the abdomen was performed both before and after the administration of intravenous contrast. Heavily T2-weighted images of the biliary and pancreatic ducts were obtained, and three-dimensional MRCP images were rendered by post processing. CONTRAST:  39mL GADAVIST GADOBUTROL 1 MMOL/ML IV SOLN COMPARISON:  Multiple priors including same-day ultrasound, CT abdomen pelvis December 15, 2014, right upper quadrant ultrasound November 28, 2014 and HIDA scan January 01, 2015. FINDINGS: Lower chest: No acute abnormality. Hepatobiliary: Mild diffuse hepatic steatosis. No suspicious hepatic lesion. Numerous cholelithiasis and biliary sludge are again visualized within the lumen of a mildly distended gallbladder measuring up to 3.2 cm. There is mild wall thickening measuring up to 4 mm, slightly increased wall thickness in comparison to studies from 2016 when it measured upper limits of normal at 3 mm. There is trace pericholecystic fluid. No biliary ductal dilation with the common duct measuring 5.6 mm. There is layering low-density debris in the common bile duct without visualized choledocholithiasis. Pancreas: No mass, inflammatory changes, or other parenchymal abnormality identified. Spleen:  Within normal limits in size and appearance. Adrenals/Urinary Tract: No masses identified. No evidence of hydronephrosis. 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 No evidence of obstruction. Colonic diverticulosis. Vascular/Lymphatic: No pathologically enlarged lymph nodes identified. No abdominal aortic aneurysm demonstrated. Other:  None. Musculoskeletal: No suspicious bone lesions  identified. IMPRESSION: 1. Numerous cholelithiasis and biliary sludge are again visualized within the lumen of a mildly distended gallbladder measuring up to 3.2 cm, with mild wall thickening measuring up to 4 mm. Slightly increased in comparison to studies from 2016 when it measured upper limits of normal at 3 mm. There is trace pericholecystic fluid. These findings are equivocal for acute cholecystitis. Suggest further evaluation with nuclear medicine HIDA scan to assess for cystic duct patency. 2. There is layering low-density debris in the common bile duct, likely reflecting biliary sludge without biliary ductal dilation or visualized choledocholithiasis. 3. Mild diffuse hepatic steatosis. 4. 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 Electronically Signed   By: Dahlia Bailiff MD   On: 02/04/2021 22:51   MR ABDOMEN MRCP W WO CONTAST  Result Date: 02/04/2021 CLINICAL DATA:  Right upper quadrant abdominal pain, ultrasound nondiagnostic. EXAM: MRI ABDOMEN WITHOUT AND WITH CONTRAST (INCLUDING MRCP) TECHNIQUE: Multiplanar multisequence MR imaging of the abdomen was performed both before and after the administration of intravenous contrast. Heavily T2-weighted images of the biliary and pancreatic ducts were obtained, and three-dimensional MRCP images were rendered by post processing. CONTRAST:  38mL GADAVIST GADOBUTROL 1 MMOL/ML IV SOLN COMPARISON:  Multiple priors including same-day ultrasound, CT abdomen pelvis December 15, 2014, right upper quadrant ultrasound  November 28, 2014 and HIDA scan January 01, 2015. FINDINGS: Lower chest: No acute abnormality. Hepatobiliary: Mild diffuse hepatic steatosis. No suspicious hepatic lesion. Numerous cholelithiasis and biliary sludge are again visualized within the lumen of a mildly distended gallbladder measuring up to 3.2 cm. There is mild wall  thickening measuring up to 4 mm, slightly increased wall thickness in comparison to studies from 2016 when it measured upper limits of normal at 3 mm. There is trace pericholecystic fluid. No biliary ductal dilation with the common duct measuring 5.6 mm. There is layering low-density debris in the common bile duct without visualized choledocholithiasis. Pancreas: No mass, inflammatory changes, or other parenchymal abnormality identified. Spleen:  Within normal limits in size and appearance. Adrenals/Urinary Tract: No masses identified. No evidence of hydronephrosis. 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 No evidence of obstruction. Colonic diverticulosis. Vascular/Lymphatic: No pathologically enlarged lymph nodes identified. No abdominal aortic aneurysm demonstrated. Other:  None. Musculoskeletal: No suspicious bone lesions identified. IMPRESSION: 1. Numerous cholelithiasis and biliary sludge are again visualized within the lumen of a mildly distended gallbladder measuring up to 3.2 cm, with mild wall thickening measuring up to 4 mm. Slightly increased in comparison to studies from 2016 when it measured upper limits of normal at 3 mm. There is trace pericholecystic fluid. These findings are equivocal for acute cholecystitis. Suggest further evaluation with nuclear medicine HIDA scan to assess for cystic duct patency. 2. There is layering low-density debris in the common bile duct, likely reflecting biliary sludge without biliary ductal dilation or visualized choledocholithiasis. 3. Mild diffuse hepatic steatosis. 4. 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 Electronically Signed   By: Dahlia Bailiff MD   On: 02/04/2021 22:51   US Abdomen Limited RUQ  (LIVER/GB)  Result Date: 02/04/2021 CLINICAL DATA:  RIGHT upper quadrant pain EXAM: ULTRASOUND ABDOMEN LIMITED RIGHT UPPER QUADRANT COMPARISON:  02/02/2021 FINDINGS: Gallbladder: Multiple shadowing calculi within gallbladder up to 13 mm diameter. Small amount of accompanying gallbladder sludge. Gallbladder wall minimally thickened 4 mm diameter, slightly increased from previous exam. No sonographic Murphy sign or pericholecystic fluid Common bile duct: Diameter: 6 mm, upper normal, unchanged Liver: Minimally increased echogenicity with smooth margins question fatty infiltration. No mass or nodularity. No intrahepatic biliary dilatation. Portal vein is patent on color Doppler imaging with normal direction of blood flow towards the liver. Other: No RIGHT upper quadrant free fluid. IMPRESSION: Multiple calculi and sludge within the gallbladder. Minimal gallbladder wall thickening, new versus previous exam though no sonographic Murphy sign or pericholecystic fluid are identified. Electronically Signed   By: Lavonia Dana M.D.   On: 02/04/2021 19:22    ROS - all of the below systems have been reviewed with the patient and positives are indicated with bold text General: chills, fever or night sweats Eyes: blurry vision or double vision ENT: epistaxis or sore throat Allergy/Immunology: itchy/watery eyes or nasal congestion Hematologic/Lymphatic: bleeding problems, blood clots or swollen lymph nodes Endocrine: temperature intolerance or unexpected weight changes Breast: new or changing breast lumps or nipple discharge Resp: cough, shortness of breath, or wheezing CV: chest pain or dyspnea on exertion GI: as per HPI GU: dysuria, trouble voiding, or hematuria MSK: joint pain or joint stiffness Neuro: TIA or stroke symptoms Derm: pruritus and skin lesion changes  Psych: anxiety and depression  PE Blood pressure 132/85, pulse (!) 104, temperature 98.1 F (36.7 C), temperature source Oral, resp. rate 18,  height 5\' 6"  (1.676 m), weight 111.1 kg, last menstrual period 08/25/2015, SpO2 100 %. Constitutional: NAD; conversant; no deformities Eyes: Moist conjunctiva; no lid lag; anicteric; PERRL Neck: Trachea midline; no thyromegaly Lungs: Normal respiratory effort; no tactile fremitus CV: RRR; no palpable thrills; no pitting edema GI: Abd tenderness with guarding which is moderate to severe in the right upper quadrant; no palpable hepatosplenomegaly MSK: Normal range of motion of extremities; no clubbing/cyanosis Psychiatric: Appropriate affect; alert and oriented x3 Lymphatic: No palpable cervical or axillary lymphadenopathy  Results for orders placed or performed during the hospital encounter of 02/04/21 (from the past 48 hour(s))  Lipase, blood     Status: None   Collection Time: 02/04/21  2:11 PM  Result Value Ref Range   Lipase 19 11 - 51 U/L    Comment: Performed at [facility], [place] [address], [place], [place] [address]  Comprehensive metabolic panel     Status: Abnormal   Collection Time: 02/04/21  2:11 PM  Result Value Ref Range   Sodium 136 135 - 145 mmol/L   Potassium 3.9 3.5 - 5.1 mmol/L   Chloride 100 98 - 111 mmol/L   CO2 23 22 - 32 mmol/L   Glucose, Bld 132 (H) 70 - 99 mg/dL    Comment: Glucose reference range applies only to samples taken after fasting for at least 8 hours.   BUN 25 (H) 6 - 20 mg/dL   Creatinine, Ser 1.47 (H) 0.44 - 1.00 mg/dL   Calcium 8.9 8.9 - 10.3 mg/dL   Total Protein 7.6 6.5 - 8.1 g/dL   Albumin 3.6 3.5 - 5.0 g/dL   AST 125 (H) 15 - 41 U/L   ALT 333 (H) 0 - 44 U/L   Alkaline Phosphatase 284 (H) 38 - 126 U/L   Total Bilirubin 3.7 (H) 0.3 - 1.2 mg/dL   GFR, Estimated 42 (L) >60 mL/min    Comment: (NOTE) Calculated using the CKD-EPI Creatinine Equation (2021)    Anion gap 13 5 - 15    Comment: Performed at [facility], [place] [address] [place], [place] [address]  CBC     Status: Abnormal   Collection Time:  02/04/21  2:11 PM  Result Value Ref Range   WBC 6.6 4.0 - 10.5 K/uL   RBC 4.05 3.87 - 5.11 MIL/uL   Hemoglobin 10.8 (L) 12.0 - 15.0 g/dL   HCT 35.2 (L) 36.0 - 46.0 %   MCV 86.9 80.0 - 100.0 fL   MCH 26.7 26.0 - 34.0 pg   MCHC 30.7 30.0 - 36.0 g/dL   RDW 13.2 11.5 - 15.5 %   Platelets 237 150 - 400 K/uL   nRBC 0.0 0.0 - 0.2 %    Comment: Performed at [facility], [place] [address] [place],  [address]  Urinalysis, Routine w reflex microscopic     Status: Abnormal   Collection Time: 02/04/21  2:11 PM  Result Value Ref Range   Color, Urine AMBER (A) YELLOW    Comment: BIOCHEMICALS MAY BE AFFECTED BY COLOR   APPearance CLEAR CLEAR   Specific Gravity, Urine 1.019 1.005 - 1.030   pH 5.0 5.0 - 8.0   Glucose, UA NEGATIVE NEGATIVE mg/dL   Hgb urine dipstick NEGATIVE NEGATIVE   Bilirubin Urine SMALL (A) NEGATIVE   Ketones, ur NEGATIVE  NEGATIVE mg/dL   Protein, ur NEGATIVE NEGATIVE mg/dL   Nitrite NEGATIVE NEGATIVE   Leukocytes,Ua NEGATIVE NEGATIVE    Comment: Performed at [place] [address] [place], [place] [address]  Resp Panel by RT-PCR (Flu A&B, Covid) Nasopharyngeal Swab     Status: None   Collection Time: 02/04/21  6:43 PM   Specimen: Nasopharyngeal Swab; Nasopharyngeal(NP) swabs in vial transport medium  Result Value Ref Range   SARS Coronavirus 2 by RT PCR NEGATIVE NEGATIVE    Comment: (NOTE) SARS-CoV-2 target nucleic acids are NOT DETECTED.  The SARS-CoV-2 RNA is generally detectable in upper respiratory specimens during the acute phase of infection. The lowest concentration of SARS-CoV-2 viral copies this assay can detect is 138 copies/mL. A negative result does not preclude SARS-Cov-2 infection and should not be used as the sole basis for treatment or other patient management decisions. A negative result may occur with  improper specimen collection/handling, submission of specimen other than nasopharyngeal swab,  presence of viral mutation(s) within the areas targeted by this assay, and inadequate number of viral copies(<138 copies/mL). A negative result must be combined with clinical observations, patient history, and epidemiological information. The expected result is Negative.  Fact Sheet for Patients:  EntrepreneurPulse.com.au  Fact Sheet for Healthcare Providers:  IncredibleEmployment.be  This test is no t yet approved or cleared by the [place] FDA and  has been authorized for detection and/or diagnosis of SARS-CoV-2 by FDA under an Emergency Use Authorization (EUA). This EUA will remain  in effect (meaning this test can be used) for the duration of the COVID-19 declaration under Section 564(b)(1) of the Act, 21 U.S.C.section 360bbb-3(b)(1), unless the authorization is terminated  or revoked sooner.       Influenza A by PCR NEGATIVE NEGATIVE   Influenza B by PCR NEGATIVE NEGATIVE    Comment: (NOTE) The Xpert Xpress SARS-CoV-2/FLU/RSV plus assay is intended as an aid in the diagnosis of influenza from Nasopharyngeal swab specimens and should not be used as a sole basis for treatment. Nasal washings and aspirates are unacceptable for Xpert Xpress SARS-CoV-2/FLU/RSV testing.  Fact Sheet for Patients: EntrepreneurPulse.com.au  Fact Sheet for Healthcare Providers: IncredibleEmployment.be  This test is not yet approved or cleared by the [place] FDA and has been authorized for detection and/or diagnosis of SARS-CoV-2 by FDA under an Emergency Use Authorization (EUA). This EUA will remain in effect (meaning this test can be used) for the duration of the COVID-19 declaration under Section 564(b)(1) of the Act, 21 U.S.C. section 360bbb-3(b)(1), unless the authorization is terminated or revoked.  Performed at [facility], [place] [address] [place], [place] [address]   Comprehensive  metabolic panel     Status: Abnormal   Collection Time: 02/05/21  6:04 AM  Result Value Ref Range   Sodium 135 135 - 145 mmol/L   Potassium 3.6 3.5 - 5.1 mmol/L   Chloride 103 98 - 111 mmol/L   CO2 24 22 - 32 mmol/L   Glucose, Bld 107 (H) 70 - 99 mg/dL    Comment: Glucose reference range applies only to samples taken after fasting for at least 8 hours.   BUN 17 6 - 20 mg/dL   Creatinine, Ser 1.10 (H) 0.44 - 1.00 mg/dL   Calcium 8.4 (L) 8.9 - 10.3 mg/dL   Total Protein 6.4 (L) 6.5 - 8.1 g/dL   Albumin 3.0 (L) 3.5 - 5.0 g/dL   AST 62 (H) 15 - 41 U/L  ALT 221 (H) 0 - 44 U/L   Alkaline Phosphatase 218 (H) 38 - 126 U/L   Total Bilirubin 2.9 (H) 0.3 - 1.2 mg/dL   GFR, Estimated 59 (L) >60 mL/min    Comment: (NOTE) Calculated using the CKD-EPI Creatinine Equation (2021)    Anion gap 8 5 - 15    Comment: Performed at [facility], [place] [address] [place], [place] [address]  CBC     Status: Abnormal   Collection Time: 02/05/21  6:04 AM  Result Value Ref Range   WBC 6.7 4.0 - 10.5 K/uL   RBC 3.48 (L) 3.87 - 5.11 MIL/uL   Hemoglobin 9.1 (L) 12.0 - 15.0 g/dL   HCT 30.0 (L) 36.0 - 46.0 %   MCV 86.2 80.0 - 100.0 fL   MCH 26.1 26.0 - 34.0 pg   MCHC 30.3 30.0 - 36.0 g/dL   RDW 13.3 11.5 - 15.5 %   Platelets 185 150 - 400 K/uL   nRBC 0.0 0.0 - 0.2 %    Comment: Performed at [facility], [place] [address], [place], [place] [address]    MR 3D Recon At Scanner  Result Date: 02/04/2021 CLINICAL DATA:  Right upper quadrant abdominal pain, ultrasound nondiagnostic. EXAM: MRI ABDOMEN WITHOUT AND WITH CONTRAST (INCLUDING MRCP) TECHNIQUE: Multiplanar multisequence MR imaging of the abdomen was performed both before and after the administration of intravenous contrast. Heavily T2-weighted images of the biliary and pancreatic ducts were obtained, and three-dimensional MRCP images were rendered by post processing. CONTRAST:  71mL GADAVIST GADOBUTROL 1 MMOL/ML IV SOLN  COMPARISON:  Multiple priors including same-day ultrasound, CT abdomen pelvis December 15, 2014, right upper quadrant ultrasound November 28, 2014 and HIDA scan January 01, 2015. FINDINGS: Lower chest: No acute abnormality. Hepatobiliary: Mild diffuse hepatic steatosis. No suspicious hepatic lesion. Numerous cholelithiasis and biliary sludge are again visualized within the lumen of a mildly distended gallbladder measuring up to 3.2 cm. There is mild wall thickening measuring up to 4 mm, slightly increased wall thickness in comparison to studies from 2016 when it measured upper limits of normal at 3 mm. There is trace pericholecystic fluid. No biliary ductal dilation with the common duct measuring 5.6 mm. There is layering low-density debris in the common bile duct without visualized choledocholithiasis. Pancreas: No mass, inflammatory changes, or other parenchymal abnormality identified. Spleen:  Within normal limits in size and appearance. Adrenals/Urinary Tract: No masses identified. No evidence of hydronephrosis. 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 No evidence of obstruction. Colonic diverticulosis. Vascular/Lymphatic: No pathologically enlarged lymph nodes identified. No abdominal aortic aneurysm demonstrated. Other:  None. Musculoskeletal: No suspicious bone lesions identified. IMPRESSION: 1. Numerous cholelithiasis and biliary sludge are again visualized within the lumen of a mildly distended gallbladder measuring up to 3.2 cm, with mild wall thickening measuring up to 4 mm. Slightly increased in comparison to studies from 2016 when it measured upper limits of normal at 3 mm. There is trace pericholecystic fluid. These findings are equivocal for acute cholecystitis. Suggest further evaluation with nuclear medicine HIDA scan to assess for cystic duct patency. 2. There is layering  low-density debris in the common bile duct, likely reflecting biliary sludge without biliary ductal dilation or visualized choledocholithiasis. 3. Mild diffuse hepatic steatosis. 4. 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 Electronically Signed   By: Dahlia Bailiff MD   On: 02/04/2021 22:51   MR ABDOMEN MRCP W WO CONTAST  Result Date: 02/04/2021 CLINICAL DATA:  Right upper quadrant abdominal pain, ultrasound nondiagnostic. EXAM: MRI ABDOMEN WITHOUT AND WITH CONTRAST (INCLUDING MRCP) TECHNIQUE: Multiplanar multisequence MR imaging of the abdomen was performed both before and after the administration of intravenous contrast. Heavily T2-weighted images of the biliary and pancreatic ducts were obtained, and three-dimensional MRCP images were rendered by post processing. CONTRAST:  85mL GADAVIST GADOBUTROL 1 MMOL/ML IV SOLN COMPARISON:  Multiple priors including same-day ultrasound, CT abdomen pelvis December 15, 2014, right upper quadrant ultrasound November 28, 2014 and HIDA scan January 01, 2015. FINDINGS: Lower chest: No acute abnormality. Hepatobiliary: Mild diffuse hepatic steatosis. No suspicious hepatic lesion. Numerous cholelithiasis and biliary sludge are again visualized within the lumen of a mildly distended gallbladder measuring up to 3.2 cm. There is mild wall thickening measuring up to 4 mm, slightly increased wall thickness in comparison to studies from 2016 when it measured upper limits of normal at 3 mm. There is trace pericholecystic fluid. No biliary ductal dilation with the common duct measuring 5.6 mm. There is layering low-density debris in the common bile duct without visualized choledocholithiasis. Pancreas: No mass, inflammatory changes, or other parenchymal abnormality identified. Spleen:  Within normal limits in size and appearance. Adrenals/Urinary Tract: No  masses identified. No evidence of hydronephrosis. 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 No evidence of obstruction. Colonic diverticulosis. Vascular/Lymphatic: No pathologically enlarged lymph nodes identified. No abdominal aortic aneurysm demonstrated. Other:  None. Musculoskeletal: No suspicious bone lesions identified. IMPRESSION: 1. Numerous cholelithiasis and biliary sludge are again visualized within the lumen of a mildly distended gallbladder measuring up to 3.2 cm, with mild wall thickening measuring up to 4 mm. Slightly increased in comparison to studies from 2016 when it measured upper limits of normal at 3 mm. There is trace pericholecystic fluid. These findings are equivocal for acute cholecystitis. Suggest further evaluation with nuclear medicine HIDA scan to assess for cystic duct patency. 2. There is layering low-density debris in the common bile duct, likely reflecting biliary sludge without biliary ductal dilation or visualized choledocholithiasis. 3. Mild diffuse hepatic steatosis. 4. 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 Electronically Signed   By: Dahlia Bailiff MD   On: 02/04/2021 22:51   US Abdomen Limited RUQ (LIVER/GB)  Result Date: 02/04/2021 CLINICAL DATA:  RIGHT upper quadrant pain EXAM: ULTRASOUND ABDOMEN LIMITED RIGHT UPPER QUADRANT COMPARISON:  02/02/2021 FINDINGS: Gallbladder: Multiple shadowing calculi within gallbladder up to 13 mm diameter. Small amount of accompanying gallbladder sludge. Gallbladder wall minimally thickened 4 mm diameter, slightly increased from previous exam. No sonographic Murphy sign or pericholecystic fluid Common bile duct: Diameter: 6 mm, upper normal, unchanged Liver: Minimally increased echogenicity  with smooth margins question fatty infiltration. No mass or nodularity. No intrahepatic biliary dilatation. Portal vein is patent on color Doppler imaging with normal direction of blood flow towards the liver. Other: No RIGHT upper quadrant free fluid. IMPRESSION: Multiple calculi and sludge within the gallbladder. Minimal gallbladder wall thickening, new versus previous exam though no sonographic Murphy sign or pericholecystic fluid are identified. Electronically Signed   By: Lavonia Dana  M.D.   On: 02/04/2021 19:22     A/P: Acute cholecystitis with cholelithiasis and possible biliary sludge in the bile duct  I discussed the diagnosis with the patient.  I recommend we proceed to the operating room today for a laparoscopic cholecystectomy cholangiogram.  I discussed this with her in detail.  We discussed the reasons for surgery.  I discussed the risk which includes but is not limited to bleeding, infection, bile duct injury, bile leak, the need for further procedures, the need for conversion to an open procedure, cardiopulmonary issues, DVT, etc.  She understands and agrees to proceed with surgery which will be scheduled.  Coralie Keens M.D. [facility] Use [email or web address] to contact on call provider

## 2021-02-05 NOTE — Anesthesia Postprocedure Evaluation (Signed)
Anesthesia Post Note  Patient: Gwendolyn Russell  Procedure(s) Performed: LAPAROSCOPIC CHOLECYSTECTOMY WITH INTRAOPERATIVE CHOLANGIOGRAM (N/A )     Patient location during evaluation: PACU Anesthesia Type: General Level of consciousness: awake and sedated Pain management: pain level controlled Vital Signs Assessment: post-procedure vital signs reviewed and stable Respiratory status: spontaneous breathing Cardiovascular status: stable Postop Assessment: no apparent nausea or vomiting Anesthetic complications: no   No complications documented.  Last Vitals:  Vitals:   02/05/21 1300 02/05/21 1315  BP: 140/83 (!) 141/99  Pulse:  99  Resp: 20 17  Temp: 37.1 C   SpO2: 98% 98%    Last Pain:  Vitals:   02/05/21 1315  TempSrc:   PainSc: Hunter Jr

## 2021-02-05 NOTE — Anesthesia Procedure Notes (Signed)
Procedure Name: Intubation Date/Time: 02/05/2021 11:19 AM Performed by: Gwyndolyn Saxon, CRNA Pre-anesthesia Checklist: Patient identified, Emergency Drugs available, Suction available and Patient being monitored Patient Re-evaluated:Patient Re-evaluated prior to induction Oxygen Delivery Method: Circle system utilized Preoxygenation: Pre-oxygenation with 100% oxygen Induction Type: IV induction Ventilation: Mask ventilation without difficulty Laryngoscope Size: Mac and 3 Grade View: Grade I Tube type: Oral Tube size: 7.0 mm Number of attempts: 1 Airway Equipment and Method: Patient positioned with wedge pillow and Stylet Placement Confirmation: ETT inserted through vocal cords under direct vision,  positive ETCO2 and breath sounds checked- equal and bilateral Secured at: 21 cm Tube secured with: Tape Dental Injury: Teeth and Oropharynx as per pre-operative assessment

## 2021-02-05 NOTE — Op Note (Signed)
LAPAROSCOPIC CHOLECYSTECTOMY WITH INTRAOPERATIVE CHOLANGIOGRAM  Procedure Note  Gwendolyn Russell 02/05/2021   Pre-op Diagnosis: ACUTE CHOLECYSTITIS WITH CHOLELITHIASIS     Post-op Diagnosis: SAME  Procedure(s): LAPAROSCOPIC CHOLECYSTECTOMY WITH INTRAOPERATIVE CHOLANGIOGRAM  Surgeon(s): Coralie Keens, MD Leighton Ruff, MD  Anesthesia: General  Staff:  Circulator: Charlcie Cradle, RN Radiology Technologist: Gery Pray, RT Scrub Person: Earney Mallet  Estimated Blood Loss: Minimal               Specimens: SENT TO PATH  Findings: The patient was found to have an acutely inflamed distended gallbladder.  A cholangiogram was performed.  I could not get contrast to flow into the duodenum from the distal bile duct.  There is a question of a obstructing stone.  Radiology review is pending  Procedure: Patient brought to operating identifies correct patient.  She is placed upon the operating table general esthesia was induced.  Her abdomen was then prepped and draped in usual sterile fashion.  I made a small vertical incision below the umbilicus with a scalpel.  I took this down to the fascia which was then opened the scalpel.  Hemostat was then used to pass the peritoneal cavity under direct vision.  A 0 Vicryl pursing suture was placed on the fascia opening.  The [place] port is placed to open insufflation of the abdomen was begun.  I placed a 5 mm trocar in the patient's epigastrium and 2 more in the right upper quadrant all under direct vision.  The gallbladder is found to be acutely inflamed and distended.  I had to aspirate bile from the gallbladder and direct vision in order to facilitate grasping it.  The gallbladder was then grasped retracted above liver bed.  The cystic duct was identified and a critical end was achieved around it.  It was clipped once distally and opened laparoscopic scissors.  A cholangiocatheter was then placed a small opening in the  right upper quadrant under direct vision.  The catheter was passed into the opening the cystic duct.  I then performed a cholangiogram with contrast.  Contrast was seen to flow proximally to the biliary radicles and then distally but appeared immediate obstruction just at the entrance to the duodenum.  It was difficult to tell if there was a true meniscus versus spasm.  The cholangiocatheter was removed.  I clipped the cystic duct 3 times proximally and completely transected.  The cystic artery and a posterior branch of the identified clipped proximally distally and transected as well.  The gallbladder was then slowly dissected free from the liver bed with electrocautery.  There was a moderate amount of venous bleeding from the liver bed secondary to inflamed gallbladder.  I removed the bladder completely and placed in an Endosac and removed with the incision at the umbilicus.  I then had to significantly cauterized the liver bed and placed 2 pieces of surgical note under direct pressure in order to finally achieve hemostasis in the gallbladder fossa.  At this point we thoroughly irrigate the abdomen with normal saline.  Again hemostasis appeared to be achieved.  All ports removed under direct vision the abdomen was deflated.  The 0 Vicryl at the umbilicus was tied in place closing the fascial defect.  All incisions then anesthetized Marcaine closed with 4-0 Monocryl sutures.  Dermabond was then applied.  The patient tolerated the procedure well.  All the counts were correct at the end of the procedure.  The patient was then extubated  in the operating room and taken in stable addition to the recovery room.           Coralie Keens   Date: 02/05/2021  Time: 12:27 PM

## 2021-02-05 NOTE — Progress Notes (Signed)
PROGRESS NOTE    Gwendolyn Russell  GUR:[id number] DOB: [birth year] DOA: 02/04/2021 PCP: Sofie Rower, FNP    Brief Narrative:   55 y.o. female with medical history significant of migraines, obesity, anxiety, DVT, GERD, hypertension who presents with worsening of abdominal pain and nausea and vomiting. Pt was found to have acute cholecystitis. General surgery was consulted.  Assessment & Plan:   Principal Problem:   Acute cholecystitis Active Problems:   Migraine   Anxiety   Chronic GERD   Low back pain with sciatica   Essential hypertension   AKI (acute kidney injury) ([place])   Right upper quadrant abdominal pain Cholecystitis ?Choledocholithiasis > Patient presenting with right upper quadrant pain was previously seen for this 2 days ago but pain has worsened and now with minimal wall thickening on ultrasound in addition to gallstones and sludge. > Positive Murphy sign noted on presenting exam  > LFTs have increased from 2 days ago including ALT, ALP and T bili. > General surgery consulted and think her presentation is suspicious for choledocholithiasis and recommend consulting GI GI recommend MRCP -MRCP reviewed. Findings consistent with acute cholecystitis. - Appreciate General Surgery input. Plan for lap chole today -cont to follow LFT trends  AKI > In the setting of decreased p.o. intake due to abdominal pain as above.  Presenting creatinine was elevated 1.47 from baseline of around 1. - Improved with IVF hydration -Repeat bmet in AM  Anxiety/depression - Continue home prazosin, Xanax, hydroxyzine, nortriptyline  Migraines - Continue home  Hypertension - Hold home spironolactone  Chronic pain  - Continue gabapentin and oxycodone  GERD - Continue PPI  History of sleeve gastrectomy - appears stable    DVT prophylaxis: SCD's Code Status: Full Family Communication: Pt in room, family not at bedside  Status is: Observation  The patient remains OBS  appropriate and will d/c before 2 midnights.  Dispo: The patient is from: Home              Anticipated d/c is to: Home              Patient currently is not medically stable to d/c.   Difficult to place patient No       Consultants:   General Surgery  Procedures:   MRICP  Antimicrobials: Anti-infectives (From admission, onward)   Start     Dose/Rate Route Frequency Ordered Stop   02/05/21 0400  piperacillin-tazobactam (ZOSYN) IVPB 3.375 g        3.375 g 12.5 mL/hr over 240 Minutes Intravenous Every 8 hours 02/04/21 2258     02/05/21 0300  piperacillin-tazobactam (ZOSYN) IVPB 3.375 g  Status:  Discontinued        3.375 g 100 mL/hr over 30 Minutes Intravenous Every 6 hours 02/04/21 2244 02/04/21 2258   02/04/21 2015  piperacillin-tazobactam (ZOSYN) IVPB 3.375 g        3.375 g 100 mL/hr over 30 Minutes Intravenous  Once 02/04/21 2014 02/04/21 2053       Subjective: Complaining of abd pain. Pt seen prior to surgery today  Objective: Vitals:   02/05/21 0715 02/05/21 0800 02/05/21 0815 02/05/21 0900  BP: 132/79 (!) 156/88 (!) 150/50 120/81  Pulse: (!) 104  94 (!) 110  Resp: 18  18 18   Temp:      TempSrc:      SpO2: 100%  99% 93%  Weight:      Height:        Intake/Output Summary (Last 24 hours) at  02/05/2021 0945 Last data filed at 02/04/2021 2019 Gross per 24 hour  Intake 1000 ml  Output -  Net 1000 ml   Filed Weights   02/04/21 1355  Weight: 111.1 kg    Examination:  General exam: Appears calm and comfortable  Respiratory system: Clear to auscultation. Respiratory effort normal. Cardiovascular system: S1 & S2 heard, Regular Gastrointestinal system: Obese, nondistended, generally tender Central nervous system: Alert and oriented. No focal neurological deficits. Extremities: Symmetric 5 x 5 power. Skin: No rashes, lesions or ulcers Psychiatry: Judgement and insight appear normal. Mood & affect appropriate.   Data Reviewed: I have personally reviewed  following labs and imaging studies  CBC: Recent Labs  Lab 02/02/21 1820 02/04/21 1411 02/05/21 0604  WBC 9.3 6.6 6.7  HGB 10.8* 10.8* 9.1*  HCT 34.9* 35.2* 30.0*  MCV 86.2 86.9 86.2  PLT 239 237 761   Basic Metabolic Panel: Recent Labs  Lab 02/02/21 1820 02/04/21 1411 02/05/21 0604  NA 135 136 135  K 4.6 3.9 3.6  CL 100 100 103  CO2 26 23 24   GLUCOSE 146* 132* 107*  BUN 28* 25* 17  CREATININE 1.12* 1.47* 1.10*  CALCIUM 9.3 8.9 8.4*   GFR: Estimated Creatinine Clearance: 72.1 mL/min (A) (by C-G formula based on SCr of 1.1 mg/dL (H)). Liver Function Tests: Recent Labs  Lab 02/02/21 1820 02/04/21 1411 02/05/21 0604  AST 127* 125* 62*  ALT 77* 333* 221*  ALKPHOS 133* 284* 218*  BILITOT 0.7 3.7* 2.9*  PROT 7.0 7.6 6.4*  ALBUMIN 3.7 3.6 3.0*   Recent Labs  Lab 02/02/21 1820 02/04/21 1411  LIPASE 24 19   No results for input(s): AMMONIA in the last 168 hours. Coagulation Profile: No results for input(s): INR, PROTIME in the last 168 hours. Cardiac Enzymes: No results for input(s): CKTOTAL, CKMB, CKMBINDEX, TROPONINI in the last 168 hours. BNP (last 3 results) No results for input(s): PROBNP in the last 8760 hours. HbA1C: No results for input(s): HGBA1C in the last 72 hours. CBG: No results for input(s): GLUCAP in the last 168 hours. Lipid Profile: No results for input(s): CHOL, HDL, LDLCALC, TRIG, CHOLHDL, LDLDIRECT in the last 72 hours. Thyroid Function Tests: No results for input(s): TSH, T4TOTAL, FREET4, T3FREE, THYROIDAB in the last 72 hours. Anemia Panel: No results for input(s): VITAMINB12, FOLATE, FERRITIN, TIBC, IRON, RETICCTPCT in the last 72 hours. Sepsis Labs: No results for input(s): PROCALCITON, LATICACIDVEN in the last 168 hours.  Recent Results (from the past 240 hour(s))  Resp Panel by RT-PCR (Flu A&B, Covid) Nasopharyngeal Swab     Status: None   Collection Time: 02/04/21  6:43 PM   Specimen: Nasopharyngeal Swab; Nasopharyngeal(NP)  swabs in vial transport medium  Result Value Ref Range Status   SARS Coronavirus 2 by RT PCR NEGATIVE NEGATIVE Final    Comment: (NOTE) SARS-CoV-2 target nucleic acids are NOT DETECTED.  The SARS-CoV-2 RNA is generally detectable in upper respiratory specimens during the acute phase of infection. The lowest concentration of SARS-CoV-2 viral copies this assay can detect is 138 copies/mL. A negative result does not preclude SARS-Cov-2 infection and should not be used as the sole basis for treatment or other patient management decisions. A negative result may occur with  improper specimen collection/handling, submission of specimen other than nasopharyngeal swab, presence of viral mutation(s) within the areas targeted by this assay, and inadequate number of viral copies(<138 copies/mL). A negative result must be combined with clinical observations, patient history, and epidemiological information. The  expected result is Negative.  Fact Sheet for Patients:  EntrepreneurPulse.com.au  Fact Sheet for Healthcare Providers:  IncredibleEmployment.be  This test is no t yet approved or cleared by the [place] FDA and  has been authorized for detection and/or diagnosis of SARS-CoV-2 by FDA under an Emergency Use Authorization (EUA). This EUA will remain  in effect (meaning this test can be used) for the duration of the COVID-19 declaration under Section 564(b)(1) of the Act, 21 U.S.C.section 360bbb-3(b)(1), unless the authorization is terminated  or revoked sooner.       Influenza A by PCR NEGATIVE NEGATIVE Final   Influenza B by PCR NEGATIVE NEGATIVE Final    Comment: (NOTE) The Xpert Xpress SARS-CoV-2/FLU/RSV plus assay is intended as an aid in the diagnosis of influenza from Nasopharyngeal swab specimens and should not be used as a sole basis for treatment. Nasal washings and aspirates are unacceptable for Xpert Xpress  SARS-CoV-2/FLU/RSV testing.  Fact Sheet for Patients: EntrepreneurPulse.com.au  Fact Sheet for Healthcare Providers: IncredibleEmployment.be  This test is not yet approved or cleared by the [place] FDA and has been authorized for detection and/or diagnosis of SARS-CoV-2 by FDA under an Emergency Use Authorization (EUA). This EUA will remain in effect (meaning this test can be used) for the duration of the COVID-19 declaration under Section 564(b)(1) of the Act, 21 U.S.C. section 360bbb-3(b)(1), unless the authorization is terminated or revoked.  Performed at [facility], [place] [address] [place], [place] [address]      Radiology Studies: MR 3D Recon At Scanner  Result Date: 02/04/2021 CLINICAL DATA:  Right upper quadrant abdominal pain, ultrasound nondiagnostic. EXAM: MRI ABDOMEN WITHOUT AND WITH CONTRAST (INCLUDING MRCP) TECHNIQUE: Multiplanar multisequence MR imaging of the abdomen was performed both before and after the administration of intravenous contrast. Heavily T2-weighted images of the biliary and pancreatic ducts were obtained, and three-dimensional MRCP images were rendered by post processing. CONTRAST:  81mL GADAVIST GADOBUTROL 1 MMOL/ML IV SOLN COMPARISON:  Multiple priors including same-day ultrasound, CT abdomen pelvis December 15, 2014, right upper quadrant ultrasound November 28, 2014 and HIDA scan January 01, 2015. FINDINGS: Lower chest: No acute abnormality. Hepatobiliary: Mild diffuse hepatic steatosis. No suspicious hepatic lesion. Numerous cholelithiasis and biliary sludge are again visualized within the lumen of a mildly distended gallbladder measuring up to 3.2 cm. There is mild wall thickening measuring up to 4 mm, slightly increased wall thickness in comparison to studies from 2016 when it measured upper limits of normal at 3 mm. There is trace pericholecystic fluid. No biliary ductal dilation with the  common duct measuring 5.6 mm. There is layering low-density debris in the common bile duct without visualized choledocholithiasis. Pancreas: No mass, inflammatory changes, or other parenchymal abnormality identified. Spleen:  Within normal limits in size and appearance. Adrenals/Urinary Tract: No masses identified. No evidence of hydronephrosis. 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 No evidence of obstruction. Colonic diverticulosis. Vascular/Lymphatic: No pathologically enlarged lymph nodes identified. No abdominal aortic aneurysm demonstrated. Other:  None. Musculoskeletal: No suspicious bone lesions identified. IMPRESSION: 1. Numerous cholelithiasis and biliary sludge are again visualized within the lumen of a mildly distended gallbladder measuring up to 3.2 cm, with mild wall thickening measuring up to 4 mm. Slightly increased in comparison to studies from 2016 when it measured upper limits of normal at 3 mm. There is trace pericholecystic fluid. These findings are equivocal  for acute cholecystitis. Suggest further evaluation with nuclear medicine HIDA scan to assess for cystic duct patency. 2. There is layering low-density debris in the common bile duct, likely reflecting biliary sludge without biliary ductal dilation or visualized choledocholithiasis. 3. Mild diffuse hepatic steatosis. 4. 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 Electronically Signed   By: Dahlia Bailiff MD   On: 02/04/2021 22:51   MR ABDOMEN MRCP W WO CONTAST  Result Date: 02/04/2021 CLINICAL DATA:  Right upper quadrant abdominal pain, ultrasound nondiagnostic. EXAM: MRI ABDOMEN WITHOUT AND WITH CONTRAST (INCLUDING MRCP) TECHNIQUE: Multiplanar multisequence MR imaging of the abdomen was  performed both before and after the administration of intravenous contrast. Heavily T2-weighted images of the biliary and pancreatic ducts were obtained, and three-dimensional MRCP images were rendered by post processing. CONTRAST:  44mL GADAVIST GADOBUTROL 1 MMOL/ML IV SOLN COMPARISON:  Multiple priors including same-day ultrasound, CT abdomen pelvis December 15, 2014, right upper quadrant ultrasound November 28, 2014 and HIDA scan January 01, 2015. FINDINGS: Lower chest: No acute abnormality. Hepatobiliary: Mild diffuse hepatic steatosis. No suspicious hepatic lesion. Numerous cholelithiasis and biliary sludge are again visualized within the lumen of a mildly distended gallbladder measuring up to 3.2 cm. There is mild wall thickening measuring up to 4 mm, slightly increased wall thickness in comparison to studies from 2016 when it measured upper limits of normal at 3 mm. There is trace pericholecystic fluid. No biliary ductal dilation with the common duct measuring 5.6 mm. There is layering low-density debris in the common bile duct without visualized choledocholithiasis. Pancreas: No mass, inflammatory changes, or other parenchymal abnormality identified. Spleen:  Within normal limits in size and appearance. Adrenals/Urinary Tract: No masses identified. No evidence of hydronephrosis. 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 No evidence of obstruction. Colonic diverticulosis. Vascular/Lymphatic: No pathologically enlarged lymph nodes identified. No abdominal aortic aneurysm demonstrated. Other:  None. Musculoskeletal: No suspicious bone lesions identified. IMPRESSION: 1. Numerous cholelithiasis and biliary sludge are again visualized within the lumen of a mildly distended gallbladder measuring up to 3.2 cm, with mild wall thickening measuring up to 4 mm. Slightly increased in comparison to studies  from 2016 when it measured upper limits of normal at 3 mm. There is trace pericholecystic fluid. These findings are equivocal for acute cholecystitis. Suggest further evaluation with nuclear medicine HIDA scan to assess for cystic duct patency. 2. There is layering low-density debris in the common bile duct, likely reflecting biliary sludge without biliary ductal dilation or visualized choledocholithiasis. 3. Mild diffuse hepatic steatosis. 4. 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 Electronically Signed   By: Dahlia Bailiff MD   On: 02/04/2021 22:51   US Abdomen Limited RUQ (LIVER/GB)  Result Date: 02/04/2021 CLINICAL DATA:  RIGHT upper quadrant pain EXAM: ULTRASOUND ABDOMEN LIMITED RIGHT UPPER QUADRANT COMPARISON:  02/02/2021 FINDINGS: Gallbladder: Multiple shadowing calculi within gallbladder up to 13 mm diameter. Small amount of accompanying gallbladder sludge. Gallbladder wall minimally thickened 4 mm diameter, slightly increased from previous exam. No sonographic Murphy sign or pericholecystic fluid Common bile duct: Diameter: 6 mm, upper normal, unchanged Liver: Minimally increased echogenicity with smooth margins question fatty infiltration. No  mass or nodularity. No intrahepatic biliary dilatation. Portal vein is patent on color Doppler imaging with normal direction of blood flow towards the liver. Other: No RIGHT upper quadrant free fluid. IMPRESSION: Multiple calculi and sludge within the gallbladder. Minimal gallbladder wall thickening, new versus previous exam though no sonographic Murphy sign or pericholecystic fluid are identified. Electronically Signed   By: Lavonia Dana M.D.   On: 02/04/2021 19:22    Scheduled Meds: . gabapentin  600 mg Oral TID  . hydrOXYzine  25 mg Oral QHS  . nortriptyline  100 mg Oral QHS  . OXcarbazepine  150 mg Oral Daily  .  Oxcarbazepine  300 mg Oral QHS  . pantoprazole  40 mg Oral BID  . prazosin  5 mg Oral QHS   Continuous Infusions: . piperacillin-tazobactam (ZOSYN)  IV Stopped (02/05/21 0753)     LOS: 0 days   Marylu Lund, MD [facility] Pager On Amion  If 7PM-7AM, please contact night-coverage 02/05/2021, 9:45 AM

## 2021-02-05 NOTE — Transfer of Care (Signed)
Immediate Anesthesia Transfer of Care Note  Patient: Gwendolyn Russell  Procedure(s) Performed: LAPAROSCOPIC CHOLECYSTECTOMY WITH INTRAOPERATIVE CHOLANGIOGRAM (N/A )  Patient Location: PACU  Anesthesia Type:General  Level of Consciousness: drowsy and patient cooperative  Airway & Oxygen Therapy: Patient Spontanous Breathing and Patient connected to face mask oxygen  Post-op Assessment: Report given to RN and Post -op Vital signs reviewed and stable  Post vital signs: Reviewed and stable  Last Vitals:  Vitals Value Taken Time  BP 121/80 02/05/21 1234  Temp 37.4 C 02/05/21 1234  Pulse 98 02/05/21 1237  Resp 20 02/05/21 1237  SpO2 100 % 02/05/21 1237  Vitals shown include unvalidated device data.  Last Pain:  Vitals:   02/05/21 1234  TempSrc:   PainSc: Asleep         Complications: No complications documented.

## 2021-02-05 NOTE — Plan of Care (Signed)
  Problem: Health Behavior/Discharge Planning: Goal: Ability to manage health-related needs will improve Outcome: Progressing   Problem: Clinical Measurements: Goal: Ability to maintain clinical measurements within normal limits will improve Outcome: Progressing Goal: Will remain free from infection Outcome: Progressing   Problem: Nutrition: Goal: Adequate nutrition will be maintained Outcome: Progressing   Problem: Coping: Goal: Level of anxiety will decrease Outcome: Progressing   Problem: Pain Managment: Goal: General experience of comfort will improve Outcome: Progressing   Problem: Skin Integrity: Goal: Risk for impaired skin integrity will decrease Outcome: Progressing

## 2021-02-06 ENCOUNTER — Encounter (HOSPITAL_COMMUNITY): Payer: Self-pay | Admitting: Surgery

## 2021-02-06 DIAGNOSIS — Z6841 Body Mass Index (BMI) 40.0 and over, adult: Secondary | ICD-10-CM | POA: Diagnosis not present

## 2021-02-06 DIAGNOSIS — F32A Depression, unspecified: Secondary | ICD-10-CM | POA: Diagnosis present

## 2021-02-06 DIAGNOSIS — G8929 Other chronic pain: Secondary | ICD-10-CM | POA: Diagnosis present

## 2021-02-06 DIAGNOSIS — Z20822 Contact with and (suspected) exposure to covid-19: Secondary | ICD-10-CM | POA: Diagnosis present

## 2021-02-06 DIAGNOSIS — K5730 Diverticulosis of large intestine without perforation or abscess without bleeding: Secondary | ICD-10-CM | POA: Diagnosis present

## 2021-02-06 DIAGNOSIS — M5440 Lumbago with sciatica, unspecified side: Secondary | ICD-10-CM | POA: Diagnosis present

## 2021-02-06 DIAGNOSIS — M479 Spondylosis, unspecified: Secondary | ICD-10-CM | POA: Diagnosis present

## 2021-02-06 DIAGNOSIS — M170 Bilateral primary osteoarthritis of knee: Secondary | ICD-10-CM | POA: Diagnosis present

## 2021-02-06 DIAGNOSIS — Z9884 Bariatric surgery status: Secondary | ICD-10-CM | POA: Diagnosis not present

## 2021-02-06 DIAGNOSIS — K219 Gastro-esophageal reflux disease without esophagitis: Secondary | ICD-10-CM | POA: Diagnosis present

## 2021-02-06 DIAGNOSIS — D509 Iron deficiency anemia, unspecified: Secondary | ICD-10-CM | POA: Diagnosis present

## 2021-02-06 DIAGNOSIS — I10 Essential (primary) hypertension: Secondary | ICD-10-CM | POA: Diagnosis present

## 2021-02-06 DIAGNOSIS — K8063 Calculus of gallbladder and bile duct with acute cholecystitis with obstruction: Secondary | ICD-10-CM | POA: Diagnosis present

## 2021-02-06 DIAGNOSIS — Z9851 Tubal ligation status: Secondary | ICD-10-CM | POA: Diagnosis not present

## 2021-02-06 DIAGNOSIS — M4800 Spinal stenosis, site unspecified: Secondary | ICD-10-CM | POA: Diagnosis present

## 2021-02-06 DIAGNOSIS — M160 Bilateral primary osteoarthritis of hip: Secondary | ICD-10-CM | POA: Diagnosis present

## 2021-02-06 DIAGNOSIS — Z7982 Long term (current) use of aspirin: Secondary | ICD-10-CM | POA: Diagnosis not present

## 2021-02-06 DIAGNOSIS — N179 Acute kidney failure, unspecified: Secondary | ICD-10-CM | POA: Diagnosis present

## 2021-02-06 DIAGNOSIS — K2970 Gastritis, unspecified, without bleeding: Secondary | ICD-10-CM | POA: Diagnosis present

## 2021-02-06 DIAGNOSIS — K760 Fatty (change of) liver, not elsewhere classified: Secondary | ICD-10-CM | POA: Diagnosis present

## 2021-02-06 DIAGNOSIS — K8050 Calculus of bile duct without cholangitis or cholecystitis without obstruction: Secondary | ICD-10-CM | POA: Diagnosis not present

## 2021-02-06 DIAGNOSIS — F419 Anxiety disorder, unspecified: Secondary | ICD-10-CM | POA: Diagnosis present

## 2021-02-06 DIAGNOSIS — K810 Acute cholecystitis: Secondary | ICD-10-CM | POA: Diagnosis present

## 2021-02-06 DIAGNOSIS — Z79890 Hormone replacement therapy (postmenopausal): Secondary | ICD-10-CM | POA: Diagnosis not present

## 2021-02-06 DIAGNOSIS — G43909 Migraine, unspecified, not intractable, without status migrainosus: Secondary | ICD-10-CM | POA: Diagnosis present

## 2021-02-06 LAB — COMPREHENSIVE METABOLIC PANEL
ALT: 207 U/L — ABNORMAL HIGH (ref 0–44)
ALT: 185 U/L — ABNORMAL HIGH (ref 0–44)
AST: 86 U/L — ABNORMAL HIGH (ref 15–41)
AST: 76 U/L — ABNORMAL HIGH (ref 15–41)
Albumin: 3.5 g/dL (ref 3.5–5.0)
Albumin: 3.4 g/dL — ABNORMAL LOW (ref 3.5–5.0)
Alkaline Phosphatase: 222 U/L — ABNORMAL HIGH (ref 38–126)
Alkaline Phosphatase: 203 U/L — ABNORMAL HIGH (ref 38–126)
Anion gap: 12 (ref 5–15)
Anion gap: 11 (ref 5–15)
BUN: 10 mg/dL (ref 6–20)
BUN: 10 mg/dL (ref 6–20)
CO2: 25 mmol/L (ref 22–32)
CO2: 24 mmol/L (ref 22–32)
Calcium: 9.4 mg/dL (ref 8.9–10.3)
Calcium: 9.3 mg/dL (ref 8.9–10.3)
Chloride: 101 mmol/L (ref 98–111)
Chloride: 102 mmol/L (ref 98–111)
Creatinine, Ser: 0.9 mg/dL (ref 0.44–1.00)
Creatinine, Ser: 0.75 mg/dL (ref 0.44–1.00)
GFR, Estimated: >60 mL/min (ref >60)
GFR, Estimated: >60 mL/min (ref >60)
Glucose, Bld: 156 mg/dL — ABNORMAL HIGH (ref 70–99)
Glucose, Bld: 134 mg/dL — ABNORMAL HIGH (ref 70–99)
Potassium: 4.4 mmol/L (ref 3.5–5.1)
Potassium: 4.4 mmol/L (ref 3.5–5.1)
Sodium: 138 mmol/L (ref 135–145)
Sodium: 137 mmol/L (ref 135–145)
Total Bilirubin: 3.2 mg/dL — ABNORMAL HIGH (ref 0.3–1.2)
Total Bilirubin: 3 mg/dL — ABNORMAL HIGH (ref 0.3–1.2)
Total Protein: 7.3 g/dL (ref 6.5–8.1)
Total Protein: 6.8 g/dL (ref 6.5–8.1)

## 2021-02-06 LAB — CBC
HCT: 29.8 % — ABNORMAL LOW (ref 36.0–46.0)
Hemoglobin: 9 g/dL — ABNORMAL LOW (ref 12.0–15.0)
MCH: 26.2 pg (ref 26.0–34.0)
MCHC: 30.2 g/dL (ref 30.0–36.0)
MCV: 86.9 fL (ref 80.0–100.0)
Platelets: 207 10*3/uL (ref 150–400)
RBC: 3.43 MIL/uL — ABNORMAL LOW (ref 3.87–5.11)
RDW: 13.3 % (ref 11.5–15.5)
WBC: 8.5 10*3/uL (ref 4.0–10.5)
nRBC: 0 % (ref 0.0–0.2)

## 2021-02-06 MED ORDER — LIP MEDEX EX OINT
TOPICAL_OINTMENT | CUTANEOUS | Status: AC
  Filled 2021-02-06: qty 7

## 2021-02-06 NOTE — Progress Notes (Signed)
PROGRESS NOTE    Gwendolyn Russell  TDV:[id number] DOB: 1966/05/26 DOA: 02/04/2021 PCP: Sofie Rower, FNP    Brief Narrative:   55 y.o. female with medical history significant of migraines, obesity, anxiety, DVT, GERD, hypertension who presents with worsening of abdominal pain and nausea and vomiting. Pt was found to have acute cholecystitis. General surgery was consulted.  Assessment & Plan:   Principal Problem:   Acute cholecystitis Active Problems:   Migraine   Anxiety   Chronic GERD   Low back pain with sciatica   Essential hypertension   AKI (acute kidney injury) ([place])   Right upper quadrant abdominal pain Cholecystitis ?Choledocholithiasis > Patient presenting with right upper quadrant pain was previously seen for this 2 days ago but pain has worsened and now with minimal wall thickening on ultrasound in addition to gallstones and sludge. > Positive Murphy sign noted on presenting exam  > LFTs have increased from 2 days ago including ALT, ALP and T bili. > General surgery consulted and think her presentation is suspicious for choledocholithiasis and recommend consulting GI GI recommend MRCP -MRCP reviewed. Findings consistent with acute cholecystitis. - Appreciate General Surgery input. Pt is now s/p lap chole on 3/26 -GI following, plan for ERCP 3/28. Currently on full liquids. Will make NPO after midnight -cont to follow LFT trends  AKI > In the setting of decreased p.o. intake due to abdominal pain as above.  Presenting creatinine was elevated 1.47 from baseline of around 1. - Improved with IVF hydration -Renal panel has normalized with IVF hydration -Cont to follow lytes  Anxiety/depression - Continued on home prazosin, Xanax, hydroxyzine, nortriptyline  Migraines - Continue home regimen as needed  Hypertension - BP stable at present, currently not on hypertensive meds  Chronic pain  - Continue gabapentin and oxycodone per home regimen  GERD -  Continue PPI  History of sleeve gastrectomy - appears stable at this time   DVT prophylaxis: SCD's Code Status: Full Family Communication: Pt in room, family not at bedside  Status is: Observation  The patient will require care spanning > 2 midnights and should be moved to inpatient because: Ongoing diagnostic testing needed not appropriate for outpatient work up and Inpatient level of care appropriate due to severity of illness  Dispo: The patient is from: Home              Anticipated d/c is to: Home              Patient currently is not medically stable to d/c.   Difficult to place patient No  Consultants:   General Surgery  GI  Procedures:   MRICP  Antimicrobials: Anti-infectives (From admission, onward)   Start     Dose/Rate Route Frequency Ordered Stop   02/05/21 1126  piperacillin-tazobactam (ZOSYN) 3.375 GM/50ML IVPB       Note to Pharmacy: Dara Lords   : cabinet override      02/05/21 1126 02/05/21 1131   02/05/21 0400  piperacillin-tazobactam (ZOSYN) IVPB 3.375 g        3.375 g 12.5 mL/hr over 240 Minutes Intravenous Every 8 hours 02/04/21 2258     02/05/21 0300  piperacillin-tazobactam (ZOSYN) IVPB 3.375 g  Status:  Discontinued        3.375 g 100 mL/hr over 30 Minutes Intravenous Every 6 hours 02/04/21 2244 02/04/21 2258   02/04/21 2015  piperacillin-tazobactam (ZOSYN) IVPB 3.375 g        3.375 g 100 mL/hr over 30 Minutes  Intravenous  Once 02/04/21 2014 02/04/21 2053      Subjective: Reports feeling better. Still has some post-op abd soreness  Objective: Vitals:   02/05/21 1805 02/05/21 2246 02/06/21 0245 02/06/21 0813  BP: (!) 141/70 131/80 (!) 144/85 134/79  Pulse: 88 (!) 101 (!) 105 94  Resp: 16 18  17   Temp: 98.7 F (37.1 C) 97.6 F (36.4 C) 97.7 F (36.5 C) 97.7 F (36.5 C)  TempSrc: Oral  Oral Oral  SpO2: 100% 97% 96% 96%  Weight:      Height:        Intake/Output Summary (Last 24 hours) at 02/06/2021 1635 Last data filed at  02/06/2021 0700 Gross per 24 hour  Intake 120 ml  Output 0 ml  Net 120 ml   Filed Weights   02/04/21 1355  Weight: 111.1 kg    Examination: General exam: Awake, laying in bed, in nad Respiratory system: Normal respiratory effort, no wheezing Cardiovascular system: regular rate, s1, s2 Gastrointestinal system: Soft, nondistended, positive BS Central nervous system: CN2-12 grossly intact, strength intact Extremities: Perfused, no clubbing Skin: Normal skin turgor, no notable skin lesions seen Psychiatry: Mood normal // no visual hallucinations   Data Reviewed: I have personally reviewed following labs and imaging studies  CBC: Recent Labs  Lab 02/02/21 1820 02/04/21 1411 02/05/21 0604 02/06/21 0336  WBC 9.3 6.6 6.7 8.5  HGB 10.8* 10.8* 9.1* 9.0*  HCT 34.9* 35.2* 30.0* 29.8*  MCV 86.2 86.9 86.2 86.9  PLT 239 237 185 008   Basic Metabolic Panel: Recent Labs  Lab 02/02/21 1820 02/04/21 1411 02/05/21 0604 02/06/21 0336 02/06/21 0906  NA 135 136 135 138 137  K 4.6 3.9 3.6 4.4 4.4  CL 100 100 103 101 102  CO2 26 23 24 25 24   GLUCOSE 146* 132* 107* 156* 134*  BUN 28* 25* 17 10 10   CREATININE 1.12* 1.47* 1.10* 0.90 0.75  CALCIUM 9.3 8.9 8.4* 9.4 9.3   GFR: Estimated Creatinine Clearance: 99.2 mL/min (by C-G formula based on SCr of 0.75 mg/dL). Liver Function Tests: Recent Labs  Lab 02/02/21 1820 02/04/21 1411 02/05/21 0604 02/06/21 0336 02/06/21 0906  AST 127* 125* 62* 86* 76*  ALT 77* 333* 221* 207* 185*  ALKPHOS 133* 284* 218* 222* 203*  BILITOT 0.7 3.7* 2.9* 3.2* 3.0*  PROT 7.0 7.6 6.4* 7.3 6.8  ALBUMIN 3.7 3.6 3.0* 3.5 3.4*   Recent Labs  Lab 02/02/21 1820 02/04/21 1411  LIPASE 24 19   No results for input(s): AMMONIA in the last 168 hours. Coagulation Profile: No results for input(s): INR, PROTIME in the last 168 hours. Cardiac Enzymes: No results for input(s): CKTOTAL, CKMB, CKMBINDEX, TROPONINI in the last 168 hours. BNP (last 3  results) No results for input(s): PROBNP in the last 8760 hours. HbA1C: No results for input(s): HGBA1C in the last 72 hours. CBG: No results for input(s): GLUCAP in the last 168 hours. Lipid Profile: No results for input(s): CHOL, HDL, LDLCALC, TRIG, CHOLHDL, LDLDIRECT in the last 72 hours. Thyroid Function Tests: No results for input(s): TSH, T4TOTAL, FREET4, T3FREE, THYROIDAB in the last 72 hours. Anemia Panel: No results for input(s): VITAMINB12, FOLATE, FERRITIN, TIBC, IRON, RETICCTPCT in the last 72 hours. Sepsis Labs: No results for input(s): PROCALCITON, LATICACIDVEN in the last 168 hours.  Recent Results (from the past 240 hour(s))  Resp Panel by RT-PCR (Flu A&B, Covid) Nasopharyngeal Swab     Status: None   Collection Time: 02/04/21  6:43 PM  Specimen: Nasopharyngeal Swab; Nasopharyngeal(NP) swabs in vial transport medium  Result Value Ref Range Status   SARS Coronavirus 2 by RT PCR NEGATIVE NEGATIVE Final    Comment: (NOTE) SARS-CoV-2 target nucleic acids are NOT DETECTED.  The SARS-CoV-2 RNA is generally detectable in upper respiratory specimens during the acute phase of infection. The lowest concentration of SARS-CoV-2 viral copies this assay can detect is 138 copies/mL. A negative result does not preclude SARS-Cov-2 infection and should not be used as the sole basis for treatment or other patient management decisions. A negative result may occur with  improper specimen collection/handling, submission of specimen other than nasopharyngeal swab, presence of viral mutation(s) within the areas targeted by this assay, and inadequate number of viral copies(<138 copies/mL). A negative result must be combined with clinical observations, patient history, and epidemiological information. The expected result is Negative.  Fact Sheet for Patients:  EntrepreneurPulse.com.au  Fact Sheet for Healthcare Providers:   IncredibleEmployment.be  This test is no t yet approved or cleared by the [place] FDA and  has been authorized for detection and/or diagnosis of SARS-CoV-2 by FDA under an Emergency Use Authorization (EUA). This EUA will remain  in effect (meaning this test can be used) for the duration of the COVID-19 declaration under Section 564(b)(1) of the Act, 21 U.S.C.section 360bbb-3(b)(1), unless the authorization is terminated  or revoked sooner.       Influenza A by PCR NEGATIVE NEGATIVE Final   Influenza B by PCR NEGATIVE NEGATIVE Final    Comment: (NOTE) The Xpert Xpress SARS-CoV-2/FLU/RSV plus assay is intended as an aid in the diagnosis of influenza from Nasopharyngeal swab specimens and should not be used as a sole basis for treatment. Nasal washings and aspirates are unacceptable for Xpert Xpress SARS-CoV-2/FLU/RSV testing.  Fact Sheet for Patients: EntrepreneurPulse.com.au  Fact Sheet for Healthcare Providers: IncredibleEmployment.be  This test is not yet approved or cleared by the [place] FDA and has been authorized for detection and/or diagnosis of SARS-CoV-2 by FDA under an Emergency Use Authorization (EUA). This EUA will remain in effect (meaning this test can be used) for the duration of the COVID-19 declaration under Section 564(b)(1) of the Act, 21 U.S.C. section 360bbb-3(b)(1), unless the authorization is terminated or revoked.  Performed at [facility], [place] [address], [place], [place] [address]      Radiology Studies: DG Cholangiogram Operative  Result Date: 02/05/2021 CLINICAL DATA:  Cholelithiasis EXAM: INTRAOPERATIVE CHOLANGIOGRAM TECHNIQUE: Cholangiographic images from the C-arm fluoroscopic device were submitted for interpretation post-operatively. Please see the procedural report for the amount of contrast and the fluoroscopy time utilized. COMPARISON:  MRCP 02/04/2021  FLUOROSCOPY TIME:  0 minutes 25 seconds Dose: 25.70 mGy FINDINGS: Two intraoperative imaging series. Common hepatic duct and common bile duct appear normal in caliber. Visualized intrahepatic ducts normal appearance. No filling defects or stricture identified. Spillage of contrast into the duodenum is not demonstrated on the submitted series. Tapering at distal CBD. IMPRESSION: No evidence of choledocholithiasis or CBD stricture on submitted series. No spillage of contrast into the duodenum is present on the submitted series to confirm distal CBD patency. Electronically Signed   By: Lavonia Dana M.D.   On: 02/05/2021 12:25   MR 3D Recon At Scanner  Result Date: 02/04/2021 CLINICAL DATA:  Right upper quadrant abdominal pain, ultrasound nondiagnostic. EXAM: MRI ABDOMEN WITHOUT AND WITH CONTRAST (INCLUDING MRCP) TECHNIQUE: Multiplanar multisequence MR imaging of the abdomen was performed both before and after the administration of intravenous contrast. Heavily  T2-weighted images of the biliary and pancreatic ducts were obtained, and three-dimensional MRCP images were rendered by post processing. CONTRAST:  54mL GADAVIST GADOBUTROL 1 MMOL/ML IV SOLN COMPARISON:  Multiple priors including same-day ultrasound, CT abdomen pelvis December 15, 2014, right upper quadrant ultrasound November 28, 2014 and HIDA scan January 01, 2015. FINDINGS: Lower chest: No acute abnormality. Hepatobiliary: Mild diffuse hepatic steatosis. No suspicious hepatic lesion. Numerous cholelithiasis and biliary sludge are again visualized within the lumen of a mildly distended gallbladder measuring up to 3.2 cm. There is mild wall thickening measuring up to 4 mm, slightly increased wall thickness in comparison to studies from 2016 when it measured upper limits of normal at 3 mm. There is trace pericholecystic fluid. No biliary ductal dilation with the common duct measuring 5.6 mm. There is layering low-density debris in the common bile duct without  visualized choledocholithiasis. Pancreas: No mass, inflammatory changes, or other parenchymal abnormality identified. Spleen:  Within normal limits in size and appearance. Adrenals/Urinary Tract: No masses identified. No evidence of hydronephrosis. 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 No evidence of obstruction. Colonic diverticulosis. Vascular/Lymphatic: No pathologically enlarged lymph nodes identified. No abdominal aortic aneurysm demonstrated. Other:  None. Musculoskeletal: No suspicious bone lesions identified. IMPRESSION: 1. Numerous cholelithiasis and biliary sludge are again visualized within the lumen of a mildly distended gallbladder measuring up to 3.2 cm, with mild wall thickening measuring up to 4 mm. Slightly increased in comparison to studies from 2016 when it measured upper limits of normal at 3 mm. There is trace pericholecystic fluid. These findings are equivocal for acute cholecystitis. Suggest further evaluation with nuclear medicine HIDA scan to assess for cystic duct patency. 2. There is layering low-density debris in the common bile duct, likely reflecting biliary sludge without biliary ductal dilation or visualized choledocholithiasis. 3. Mild diffuse hepatic steatosis. 4. 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 Electronically Signed   By: Dahlia Bailiff MD   On: 02/04/2021 22:51   MR ABDOMEN MRCP W WO CONTAST  Result Date: 02/04/2021 CLINICAL DATA:  Right upper quadrant abdominal pain, ultrasound nondiagnostic. EXAM: MRI ABDOMEN WITHOUT AND WITH CONTRAST (INCLUDING MRCP) TECHNIQUE: Multiplanar multisequence MR imaging of the abdomen was performed both before and after the administration of intravenous contrast. Heavily T2-weighted images  of the biliary and pancreatic ducts were obtained, and three-dimensional MRCP images were rendered by post processing. CONTRAST:  25mL GADAVIST GADOBUTROL 1 MMOL/ML IV SOLN COMPARISON:  Multiple priors including same-day ultrasound, CT abdomen pelvis December 15, 2014, right upper quadrant ultrasound November 28, 2014 and HIDA scan January 01, 2015. FINDINGS: Lower chest: No acute abnormality. Hepatobiliary: Mild diffuse hepatic steatosis. No suspicious hepatic lesion. Numerous cholelithiasis and biliary sludge are again visualized within the lumen of a mildly distended gallbladder measuring up to 3.2 cm. There is mild wall thickening measuring up to 4 mm, slightly increased wall thickness in comparison to studies from 2016 when it measured upper limits of normal at 3 mm. There is trace pericholecystic fluid. No biliary ductal dilation with the common duct measuring 5.6 mm. There is layering low-density debris in the common bile duct without visualized choledocholithiasis. Pancreas: No mass, inflammatory changes, or other parenchymal abnormality identified. Spleen:  Within normal limits in size and appearance. Adrenals/Urinary Tract: No masses  identified. No evidence of hydronephrosis. 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 No evidence of obstruction. Colonic diverticulosis. Vascular/Lymphatic: No pathologically enlarged lymph nodes identified. No abdominal aortic aneurysm demonstrated. Other:  None. Musculoskeletal: No suspicious bone lesions identified. IMPRESSION: 1. Numerous cholelithiasis and biliary sludge are again visualized within the lumen of a mildly distended gallbladder measuring up to 3.2 cm, with mild wall thickening measuring up to 4 mm. Slightly increased in comparison to studies from 2016 when it measured upper limits of normal at 3 mm. There is trace pericholecystic fluid. These  findings are equivocal for acute cholecystitis. Suggest further evaluation with nuclear medicine HIDA scan to assess for cystic duct patency. 2. There is layering low-density debris in the common bile duct, likely reflecting biliary sludge without biliary ductal dilation or visualized choledocholithiasis. 3. Mild diffuse hepatic steatosis. 4. 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 Electronically Signed   By: Dahlia Bailiff MD   On: 02/04/2021 22:51   US Abdomen Limited RUQ (LIVER/GB)  Result Date: 02/04/2021 CLINICAL DATA:  RIGHT upper quadrant pain EXAM: ULTRASOUND ABDOMEN LIMITED RIGHT UPPER QUADRANT COMPARISON:  02/02/2021 FINDINGS: Gallbladder: Multiple shadowing calculi within gallbladder up to 13 mm diameter. Small amount of accompanying gallbladder sludge. Gallbladder wall minimally thickened 4 mm diameter, slightly increased from previous exam. No sonographic Murphy sign or pericholecystic fluid Common bile duct: Diameter: 6 mm, upper normal, unchanged Liver: Minimally increased echogenicity with smooth margins question fatty infiltration. No mass or nodularity. No intrahepatic biliary dilatation. Portal vein is patent on color Doppler imaging with normal direction of blood flow towards the liver. Other: No RIGHT upper quadrant free fluid. IMPRESSION: Multiple calculi and sludge within the gallbladder. Minimal gallbladder wall thickening, new versus previous exam though no sonographic Murphy sign or pericholecystic fluid are identified. Electronically Signed   By: Lavonia Dana M.D.   On: 02/04/2021 19:22    Scheduled Meds: . gabapentin  600 mg Oral TID  . hydrOXYzine  25 mg Oral QHS  . nortriptyline  100 mg Oral QHS  . OXcarbazepine  150 mg Oral Daily  . Oxcarbazepine  300 mg Oral QHS  . pantoprazole  40 mg Oral BID  . prazosin  5 mg Oral QHS   Continuous  Infusions: . piperacillin-tazobactam (ZOSYN)  IV 3.375 g (02/06/21 1523)     LOS: 0 days   Marylu Lund, MD [facility] Pager On Amion  If 7PM-7AM, please contact night-coverage 02/06/2021, 4:35 PM

## 2021-02-06 NOTE — Progress Notes (Signed)
UNASSIGNED PATIENT CROSS COVER [facility] Subjective: Since I last evaluated the patient, he seems to be doing much better.  Except for mild abdominal soreness she denies any complaints of nausea or vomiting.  She is requesting a food tray as she has not eaten since surgery.  She had a laparoscopic cholecystectomy yesterday with failed intraoperative cholangiogram.  Her bilirubin was 3.7- dropped down to 2.9 yesterday and is at 3.2 earlier today & at 3.9 at 9 AM today.  Alkaline phosphatase is down to 203 with AST of 76 and ALT of 185.  Patient's last colonoscopy was done by Dr. Maura Crandall in [place] about 3 years ago and was he was advised to have repeat colonoscopy in  10 years.  Objective: Vital signs in last 24 hours: Temp:  [97.6 F (36.4 C)-99.4 F (37.4 C)] 97.7 F (36.5 C) (03/27 0813) Pulse Rate:  [88-105] 94 (03/27 0813) Resp:  [13-26] 17 (03/27 0813) BP: (121-144)/(70-99) 134/79 (03/27 0813) SpO2:  [96 %-100 %] 96 % (03/27 0813)    Intake/Output from previous day: 03/26 0701 - 03/27 0700 In: 1465.1 [P.O.:120; I.V.:800; IV Piggyback:545.1] Out: 200 [Blood:200] Intake/Output this shift: No intake/output data recorded.  General appearance: alert, cooperative, appears stated age, no distress and morbidly obese Resp: clear to auscultation bilaterally Cardio: regular rate and rhythm, S1, S2 normal, no murmur, click, rub or gallop GI: soft, morbidly obese with mild tenderness on palpation;  bowel sounds normal; no masses,  no organomegaly Extremities: extremities normal, atraumatic, no cyanosis or edema  Lab Results: Recent Labs    02/04/21 1411 02/05/21 0604 02/06/21 0336  WBC 6.6 6.7 8.5  HGB 10.8* 9.1* 9.0*  HCT 35.2* 30.0* 29.8*  PLT 237 185 207   BMET Recent Labs    02/05/21 0604 02/06/21 0336 02/06/21 0906  NA 135 138 137  K 3.6 4.4 4.4  CL 103 101 102  CO2 24 25 24   GLUCOSE 107* 156* 134*  BUN 17 10 10   CREATININE 1.10* 0.90 0.75  CALCIUM 8.4* 9.4  9.3   LFT Recent Labs    02/06/21 0906  PROT 6.8  ALBUMIN 3.4*  AST 76*  ALT 185*  ALKPHOS 203*  BILITOT 3.0*   Studies/Results: DG Cholangiogram Operative  Result Date: 02/05/2021 CLINICAL DATA:  Cholelithiasis EXAM: INTRAOPERATIVE CHOLANGIOGRAM TECHNIQUE: Cholangiographic images from the C-arm fluoroscopic device were submitted for interpretation post-operatively. Please see the procedural report for the amount of contrast and the fluoroscopy time utilized. COMPARISON:  MRCP 02/04/2021 FLUOROSCOPY TIME:  0 minutes 25 seconds Dose: 25.70 mGy FINDINGS: Two intraoperative imaging series. Common hepatic duct and common bile duct appear normal in caliber. Visualized intrahepatic ducts normal appearance. No filling defects or stricture identified. Spillage of contrast into the duodenum is not demonstrated on the submitted series. Tapering at distal CBD. IMPRESSION: No evidence of choledocholithiasis or CBD stricture on submitted series. No spillage of contrast into the duodenum is present on the submitted series to confirm distal CBD patency. Electronically Signed   By: Lavonia Dana M.D.   On: 02/05/2021 12:25   MR 3D Recon At Scanner  Result Date: 02/04/2021 CLINICAL DATA:  Right upper quadrant abdominal pain, ultrasound nondiagnostic. EXAM: MRI ABDOMEN WITHOUT AND WITH CONTRAST (INCLUDING MRCP) TECHNIQUE: Multiplanar multisequence MR imaging of the abdomen was performed both before and after the administration of intravenous contrast. Heavily T2-weighted images of the biliary and pancreatic ducts were obtained, and three-dimensional MRCP images were rendered by post processing. CONTRAST:  57mL GADAVIST GADOBUTROL 1 MMOL/ML  IV SOLN COMPARISON:  Multiple priors including same-day ultrasound, CT abdomen pelvis December 15, 2014, right upper quadrant ultrasound November 28, 2014 and HIDA scan January 01, 2015. FINDINGS: Lower chest: No acute abnormality. Hepatobiliary: Mild diffuse hepatic steatosis. No  suspicious hepatic lesion. Numerous cholelithiasis and biliary sludge are again visualized within the lumen of a mildly distended gallbladder measuring up to 3.2 cm. There is mild wall thickening measuring up to 4 mm, slightly increased wall thickness in comparison to studies from 2016 when it measured upper limits of normal at 3 mm. There is trace pericholecystic fluid. No biliary ductal dilation with the common duct measuring 5.6 mm. There is layering low-density debris in the common bile duct without visualized choledocholithiasis. Pancreas: No mass, inflammatory changes, or other parenchymal abnormality identified. Spleen:  Within normal limits in size and appearance. Adrenals/Urinary Tract: No masses identified. No evidence of hydronephrosis. 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 No evidence of obstruction. Colonic diverticulosis. Vascular/Lymphatic: No pathologically enlarged lymph nodes identified. No abdominal aortic aneurysm demonstrated. Other:  None. Musculoskeletal: No suspicious bone lesions identified. IMPRESSION: 1. Numerous cholelithiasis and biliary sludge are again visualized within the lumen of a mildly distended gallbladder measuring up to 3.2 cm, with mild wall thickening measuring up to 4 mm. Slightly increased in comparison to studies from 2016 when it measured upper limits of normal at 3 mm. There is trace pericholecystic fluid. These findings are equivocal for acute cholecystitis. Suggest further evaluation with nuclear medicine HIDA scan to assess for cystic duct patency. 2. There is layering low-density debris in the common bile duct, likely reflecting biliary sludge without biliary ductal dilation or visualized choledocholithiasis. 3. Mild diffuse hepatic steatosis. 4. Changes of sleeve gastrectomy with mild thickening of the wall of the gastric antrum, which is at  least in part accentuated by under distension, but may reflect gastritis. Electronically Signed   By: Dahlia Bailiff MD   On: 02/04/2021 22:51   MR ABDOMEN MRCP W WO CONTAST  Result Date: 02/04/2021 CLINICAL DATA:  Right upper quadrant abdominal pain, ultrasound nondiagnostic. EXAM: MRI ABDOMEN WITHOUT AND WITH CONTRAST (INCLUDING MRCP) TECHNIQUE: Multiplanar multisequence MR imaging of the abdomen was performed both before and after the administration of intravenous contrast. Heavily T2-weighted images of the biliary and pancreatic ducts were obtained, and three-dimensional MRCP images were rendered by post processing. CONTRAST:  46mL GADAVIST GADOBUTROL 1 MMOL/ML IV SOLN COMPARISON:  Multiple priors including same-day ultrasound, CT abdomen pelvis December 15, 2014, right upper quadrant ultrasound November 28, 2014 and HIDA scan January 01, 2015. FINDINGS: Lower chest: No acute abnormality. Hepatobiliary: Mild diffuse hepatic steatosis. No suspicious hepatic lesion. Numerous cholelithiasis and biliary sludge are again visualized within the lumen of a mildly distended gallbladder measuring up to 3.2 cm. There is mild wall thickening measuring up to 4 mm, slightly increased wall thickness in comparison to studies from 2016 when it measured upper limits of normal at 3 mm. There is trace pericholecystic fluid. No biliary ductal dilation with the common duct measuring 5.6 mm. There is layering low-density debris in the common bile duct without visualized choledocholithiasis. Pancreas: No mass, inflammatory changes, or other parenchymal abnormality identified. Spleen:  Within normal limits in size and appearance. Adrenals/Urinary Tract: No masses identified. No evidence of hydronephrosis. Stomach/Bowel: Changes of sleeve gastrectomy with mild thickening of the wall of the gastric antrum, which is at least in part  accentuated by under distension. No evidence of obstruction. Colonic diverticulosis. Vascular/Lymphatic:  No pathologically enlarged lymph nodes identified. No abdominal aortic aneurysm demonstrated. Other:  None. Musculoskeletal: No suspicious bone lesions identified. IMPRESSION: 1. Numerous cholelithiasis and biliary sludge are again visualized within the lumen of a mildly distended gallbladder measuring up to 3.2 cm, with mild wall thickening measuring up to 4 mm. Slightly increased in comparison to studies from 2016 when it measured upper limits of normal at 3 mm. There is trace pericholecystic fluid. These findings are equivocal for acute cholecystitis. Suggest further evaluation with nuclear medicine HIDA scan to assess for cystic duct patency. 2. There is layering low-density debris in the common bile duct, likely reflecting biliary sludge without biliary ductal dilation or visualized choledocholithiasis. 3. Mild diffuse hepatic steatosis. 4. 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 Electronically Signed   By: Dahlia Bailiff MD   On: 02/04/2021 22:51   US Abdomen Limited RUQ (LIVER/GB)  Result Date: 02/04/2021 CLINICAL DATA:  RIGHT upper quadrant pain EXAM: ULTRASOUND ABDOMEN LIMITED RIGHT UPPER QUADRANT COMPARISON:  02/02/2021 FINDINGS: Gallbladder: Multiple shadowing calculi within gallbladder up to 13 mm diameter. Small amount of accompanying gallbladder sludge. Gallbladder wall minimally thickened 4 mm diameter, slightly increased from previous exam. No sonographic Murphy sign or pericholecystic fluid Common bile duct: Diameter: 6 mm, upper normal, unchanged Liver: Minimally increased echogenicity with smooth margins question fatty infiltration. No mass or nodularity. No intrahepatic biliary dilatation. Portal vein is patent on color Doppler imaging with normal direction of blood flow towards the liver. Other: No RIGHT upper quadrant free fluid. IMPRESSION: Multiple calculi  and sludge within the gallbladder. Minimal gallbladder wall thickening, new versus previous exam though no sonographic Murphy sign or pericholecystic fluid are identified. Electronically Signed   By: Lavonia Dana M.D.   On: 02/04/2021 19:22   Medications: I have reviewed the patient's current medications.  Assessment/Plan: 1) Abnormal LFTs ?choledocholithiasis status post laparoscopic cholecystectomy for gallstones and acute cholecystitis-we will reevaluate her LFTs tomorrow and make a decision with regards to an ERCP.  Due to power outage at [facility] today and the fact that the patient is asymptomatic plans are to hold off on doing the ERCP till tomorrow. Dr. Carmell Austria will be covering the service tomorrow. 2) Morbid obesity s/p sleeve sleeve gastrectomy in 2016. 3) GERD. 4) Chronic back pain and sciatica. 5) Migraine headaches. 6) Anxiety disorder. 7) Anemia.   LOS: 0 days   Juanita Craver 02/06/2021, 10:58 AM

## 2021-02-06 NOTE — Progress Notes (Signed)
Writer was called to pt's room as Camera operator. Pt, her spouse and daughter were very concerned about the pt's care. Starting with being sent home from the ED once, only to have an appointment "set up thru the ED a month later with a surgeon". Then, to come back to the hospital r/t great pain and have surgery. Spouse of pt questioning why the surgery wasn't "completed" and now the pt must go back for an ERCP 02/07/21, which they were told was going to happen today. They were also very upset about pt being NPO and not receiving food when the ERCP wasn't completed today.  Pt has since received a soft diet for this evening and will be NPO after midnight.  Pt also upset that her "Xanax of 20 years" wasn't ordered and given to her TID. Xanax has now been ordered and pt has received a dose.  Writer has explained as best possible the reasons ERCP is needed.

## 2021-02-06 NOTE — Progress Notes (Signed)
1 Day Post-Op   Subjective/Chief Complaint: She reports much less abdominal pain this morning. She feels well and is hungry   Objective: Vital signs in last 24 hours: Temp:  [97.6 F (36.4 C)-99.4 F (37.4 C)] 97.7 F (36.5 C) (03/27 0245) Pulse Rate:  [87-110] 105 (03/27 0245) Resp:  [13-26] 18 (03/26 2246) BP: (120-156)/(50-99) 144/85 (03/27 0245) SpO2:  [92 %-100 %] 96 % (03/27 0245)    Intake/Output from previous day: 03/26 0701 - 03/27 0700 In: 1465.1 [P.O.:120; I.V.:800; IV Piggyback:545.1] Out: 200 [Blood:200] Intake/Output this shift: No intake/output data recorded.  Exam: Awake and alert Abdomen is soft and obese with minimal tenderness  Lab Results:  Recent Labs    02/05/21 0604 02/06/21 0336  WBC 6.7 8.5  HGB 9.1* 9.0*  HCT 30.0* 29.8*  PLT 185 207   BMET Recent Labs    02/05/21 0604 02/06/21 0336  NA 135 138  K 3.6 4.4  CL 103 101  CO2 24 25  GLUCOSE 107* 156*  BUN 17 10  CREATININE 1.10* 0.90  CALCIUM 8.4* 9.4   PT/INR No results for input(s): LABPROT, INR in the last 72 hours. ABG No results for input(s): PHART, HCO3 in the last 72 hours.  Invalid input(s): PCO2, PO2  Studies/Results: DG Cholangiogram Operative  Result Date: 02/05/2021 CLINICAL DATA:  Cholelithiasis EXAM: INTRAOPERATIVE CHOLANGIOGRAM TECHNIQUE: Cholangiographic images from the C-arm fluoroscopic device were submitted for interpretation post-operatively. Please see the procedural report for the amount of contrast and the fluoroscopy time utilized. COMPARISON:  MRCP 02/04/2021 FLUOROSCOPY TIME:  0 minutes 25 seconds Dose: 25.70 mGy FINDINGS: Two intraoperative imaging series. Common hepatic duct and common bile duct appear normal in caliber. Visualized intrahepatic ducts normal appearance. No filling defects or stricture identified. Spillage of contrast into the duodenum is not demonstrated on the submitted series. Tapering at distal CBD. IMPRESSION: No evidence of  choledocholithiasis or CBD stricture on submitted series. No spillage of contrast into the duodenum is present on the submitted series to confirm distal CBD patency. Electronically Signed   By: Lavonia Dana M.D.   On: 02/05/2021 12:25   MR 3D Recon At Scanner  Result Date: 02/04/2021 CLINICAL DATA:  Right upper quadrant abdominal pain, ultrasound nondiagnostic. EXAM: MRI ABDOMEN WITHOUT AND WITH CONTRAST (INCLUDING MRCP) TECHNIQUE: Multiplanar multisequence MR imaging of the abdomen was performed both before and after the administration of intravenous contrast. Heavily T2-weighted images of the biliary and pancreatic ducts were obtained, and three-dimensional MRCP images were rendered by post processing. CONTRAST:  55mL GADAVIST GADOBUTROL 1 MMOL/ML IV SOLN COMPARISON:  Multiple priors including same-day ultrasound, CT abdomen pelvis December 15, 2014, right upper quadrant ultrasound November 28, 2014 and HIDA scan January 01, 2015. FINDINGS: Lower chest: No acute abnormality. Hepatobiliary: Mild diffuse hepatic steatosis. No suspicious hepatic lesion. Numerous cholelithiasis and biliary sludge are again visualized within the lumen of a mildly distended gallbladder measuring up to 3.2 cm. There is mild wall thickening measuring up to 4 mm, slightly increased wall thickness in comparison to studies from 2016 when it measured upper limits of normal at 3 mm. There is trace pericholecystic fluid. No biliary ductal dilation with the common duct measuring 5.6 mm. There is layering low-density debris in the common bile duct without visualized choledocholithiasis. Pancreas: No mass, inflammatory changes, or other parenchymal abnormality identified. Spleen:  Within normal limits in size and appearance. Adrenals/Urinary Tract: No masses identified. No evidence of hydronephrosis. Stomach/Bowel: Changes of sleeve gastrectomy with mild thickening of the  wall of the gastric antrum, which is at least in part accentuated by under  distension. No evidence of obstruction. Colonic diverticulosis. Vascular/Lymphatic: No pathologically enlarged lymph nodes identified. No abdominal aortic aneurysm demonstrated. Other:  None. Musculoskeletal: No suspicious bone lesions identified. IMPRESSION: 1. Numerous cholelithiasis and biliary sludge are again visualized within the lumen of a mildly distended gallbladder measuring up to 3.2 cm, with mild wall thickening measuring up to 4 mm. Slightly increased in comparison to studies from 2016 when it measured upper limits of normal at 3 mm. There is trace pericholecystic fluid. These findings are equivocal for acute cholecystitis. Suggest further evaluation with nuclear medicine HIDA scan to assess for cystic duct patency. 2. There is layering low-density debris in the common bile duct, likely reflecting biliary sludge without biliary ductal dilation or visualized choledocholithiasis. 3. Mild diffuse hepatic steatosis. 4. 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 Electronically Signed   By: Dahlia Bailiff MD   On: 02/04/2021 22:51   MR ABDOMEN MRCP W WO CONTAST  Result Date: 02/04/2021 CLINICAL DATA:  Right upper quadrant abdominal pain, ultrasound nondiagnostic. EXAM: MRI ABDOMEN WITHOUT AND WITH CONTRAST (INCLUDING MRCP) TECHNIQUE: Multiplanar multisequence MR imaging of the abdomen was performed both before and after the administration of intravenous contrast. Heavily T2-weighted images of the biliary and pancreatic ducts were obtained, and three-dimensional MRCP images were rendered by post processing. CONTRAST:  51mL GADAVIST GADOBUTROL 1 MMOL/ML IV SOLN COMPARISON:  Multiple priors including same-day ultrasound, CT abdomen pelvis December 15, 2014, right upper quadrant ultrasound November 28, 2014 and HIDA scan January 01, 2015. FINDINGS: Lower chest: No acute abnormality.  Hepatobiliary: Mild diffuse hepatic steatosis. No suspicious hepatic lesion. Numerous cholelithiasis and biliary sludge are again visualized within the lumen of a mildly distended gallbladder measuring up to 3.2 cm. There is mild wall thickening measuring up to 4 mm, slightly increased wall thickness in comparison to studies from 2016 when it measured upper limits of normal at 3 mm. There is trace pericholecystic fluid. No biliary ductal dilation with the common duct measuring 5.6 mm. There is layering low-density debris in the common bile duct without visualized choledocholithiasis. Pancreas: No mass, inflammatory changes, or other parenchymal abnormality identified. Spleen:  Within normal limits in size and appearance. Adrenals/Urinary Tract: No masses identified. No evidence of hydronephrosis. 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 No evidence of obstruction. Colonic diverticulosis. Vascular/Lymphatic: No pathologically enlarged lymph nodes identified. No abdominal aortic aneurysm demonstrated. Other:  None. Musculoskeletal: No suspicious bone lesions identified. IMPRESSION: 1. Numerous cholelithiasis and biliary sludge are again visualized within the lumen of a mildly distended gallbladder measuring up to 3.2 cm, with mild wall thickening measuring up to 4 mm. Slightly increased in comparison to studies from 2016 when it measured upper limits of normal at 3 mm. There is trace pericholecystic fluid. These findings are equivocal for acute cholecystitis. Suggest further evaluation with nuclear medicine HIDA scan to assess for cystic duct patency. 2. There is layering low-density debris in the common bile duct, likely reflecting biliary sludge without biliary ductal dilation or visualized choledocholithiasis. 3. Mild diffuse hepatic steatosis. 4. Changes of sleeve gastrectomy with mild thickening  of the wall of the gastric antrum, which is at least in part accentuated by under distension, but  may reflect gastritis. Electronically Signed   By: Dahlia Bailiff MD   On: 02/04/2021 22:51   US Abdomen Limited RUQ (LIVER/GB)  Result Date: 02/04/2021 CLINICAL DATA:  RIGHT upper quadrant pain EXAM: ULTRASOUND ABDOMEN LIMITED RIGHT UPPER QUADRANT COMPARISON:  02/02/2021 FINDINGS: Gallbladder: Multiple shadowing calculi within gallbladder up to 13 mm diameter. Small amount of accompanying gallbladder sludge. Gallbladder wall minimally thickened 4 mm diameter, slightly increased from previous exam. No sonographic Murphy sign or pericholecystic fluid Common bile duct: Diameter: 6 mm, upper normal, unchanged Liver: Minimally increased echogenicity with smooth margins question fatty infiltration. No mass or nodularity. No intrahepatic biliary dilatation. Portal vein is patent on color Doppler imaging with normal direction of blood flow towards the liver. Other: No RIGHT upper quadrant free fluid. IMPRESSION: Multiple calculi and sludge within the gallbladder. Minimal gallbladder wall thickening, new versus previous exam though no sonographic Murphy sign or pericholecystic fluid are identified. Electronically Signed   By: Lavonia Dana M.D.   On: 02/04/2021 19:22    Anti-infectives: Anti-infectives (From admission, onward)   Start     Dose/Rate Route Frequency Ordered Stop   02/05/21 1126  piperacillin-tazobactam (ZOSYN) 3.375 GM/50ML IVPB       Note to Pharmacy: Dara Lords   : cabinet override      02/05/21 1126 02/05/21 1131   02/05/21 0400  piperacillin-tazobactam (ZOSYN) IVPB 3.375 g        3.375 g 12.5 mL/hr over 240 Minutes Intravenous Every 8 hours 02/04/21 2258     02/05/21 0300  piperacillin-tazobactam (ZOSYN) IVPB 3.375 g  Status:  Discontinued        3.375 g 100 mL/hr over 30 Minutes Intravenous Every 6 hours 02/04/21 2244 02/04/21 2258   02/04/21 2015  piperacillin-tazobactam (ZOSYN) IVPB 3.375  g        3.375 g 100 mL/hr over 30 Minutes Intravenous  Once 02/04/21 2014 02/04/21 2053      Assessment/Plan: s/p Procedure(s): LAPAROSCOPIC CHOLECYSTECTOMY WITH INTRAOPERATIVE CHOLANGIOGRAM (N/A)  Postop day #1  Clinically she looks well but her bilirubin is back up today. The cholangiogram did not show obvious stones but contrast did not go in the duodenum. I suspect she may need an ERCP for further evaluation.  I explained this to her. I will leave her n.p.o. for now but she can be advanced to full liquid diet and made n.p.o. after midnight tonight if an ERCP cannot be performed today. She agrees with the plan.  LOS: 0 days    Coralie Keens 02/06/2021

## 2021-02-07 LAB — CBC
HCT: 28.7 % — ABNORMAL LOW (ref 36.0–46.0)
Hemoglobin: 8.7 g/dL — ABNORMAL LOW (ref 12.0–15.0)
MCH: 26.4 pg (ref 26.0–34.0)
MCHC: 30.3 g/dL (ref 30.0–36.0)
MCV: 87 fL (ref 80.0–100.0)
Platelets: 234 10*3/uL (ref 150–400)
RBC: 3.3 MIL/uL — ABNORMAL LOW (ref 3.87–5.11)
RDW: 13.7 % (ref 11.5–15.5)
WBC: 6.6 10*3/uL (ref 4.0–10.5)
nRBC: 0 % (ref 0.0–0.2)

## 2021-02-07 LAB — COMPREHENSIVE METABOLIC PANEL
ALT: 168 U/L — ABNORMAL HIGH (ref 0–44)
AST: 68 U/L — ABNORMAL HIGH (ref 15–41)
Albumin: 3 g/dL — ABNORMAL LOW (ref 3.5–5.0)
Alkaline Phosphatase: 233 U/L — ABNORMAL HIGH (ref 38–126)
Anion gap: 9 (ref 5–15)
BUN: 13 mg/dL (ref 6–20)
CO2: 29 mmol/L (ref 22–32)
Calcium: 9.3 mg/dL (ref 8.9–10.3)
Chloride: 102 mmol/L (ref 98–111)
Creatinine, Ser: 1.04 mg/dL — ABNORMAL HIGH (ref 0.44–1.00)
GFR, Estimated: >60 mL/min (ref >60)
Glucose, Bld: 125 mg/dL — ABNORMAL HIGH (ref 70–99)
Potassium: 4.2 mmol/L (ref 3.5–5.1)
Sodium: 140 mmol/L (ref 135–145)
Total Bilirubin: 2.2 mg/dL — ABNORMAL HIGH (ref 0.3–1.2)
Total Protein: 6.6 g/dL (ref 6.5–8.1)

## 2021-02-07 MED ORDER — INDOMETHACIN 50 MG RE SUPP: 100.0000 mg | Freq: Once | RECTAL | Status: DC

## 2021-02-07 MED ORDER — SODIUM CHLORIDE 0.9 % IV SOLN: INTRAVENOUS | Status: DC

## 2021-02-07 MED ORDER — BISACODYL 10 MG RE SUPP
10.0000 mg | Freq: Once | RECTAL | Status: AC
  Administered 2021-02-07: 10 mg via RECTAL
  Filled 2021-02-07: qty 1

## 2021-02-07 MED ORDER — OXYCODONE HCL 5 MG PO TABS: 5.0000 mg | ORAL_TABLET | Freq: Four times a day (QID) | ORAL | 0 refills | Status: DC | PRN

## 2021-02-07 MED ORDER — INDOMETHACIN 50 MG RE SUPP
100.0000 mg | Freq: Once | RECTAL | Status: DC
  Filled 2021-02-07: qty 2

## 2021-02-07 NOTE — Discharge Instructions (Signed)
[facility] ° °Please arrive at least 30 min before your appointment to complete your check in paperwork.  If you are unable to arrive 30 min prior to your appointment time we may have to cancel or reschedule you. °LAPAROSCOPIC SURGERY: POST OP INSTRUCTIONS °Always review your discharge instruction sheet given to you by the facility where your surgery was performed. °IF YOU HAVE DISABILITY OR FAMILY LEAVE FORMS, YOU MUST BRING THEM TO THE OFFICE FOR PROCESSING.   °DO NOT GIVE THEM TO YOUR DOCTOR. ° °PAIN CONTROL ° °1. First take acetaminophen (Tylenol) AND/or ibuprofen (Advil) to control your pain after surgery.  Follow directions on package.  Taking acetaminophen (Tylenol) and/or ibuprofen (Advil) regularly after surgery will help to control your pain and lower the amount of prescription pain medication you may need.  You should not take more than 4,000 mg (4 grams) of acetaminophen (Tylenol) in 24 hours.  You should not take ibuprofen (Advil), aleve, motrin, naprosyn or other NSAIDS if you have a history of stomach ulcers or chronic kidney disease.  °2. A prescription for pain medication may be given to you upon discharge.  Take your pain medication as prescribed, if you still have uncontrolled pain after taking acetaminophen (Tylenol) or ibuprofen (Advil). °3. Use ice packs to help control pain. °4. If you need a refill on your pain medication, please contact your pharmacy.  They will contact our office to request authorization. Prescriptions will not be filled after 5pm or on week-ends. ° °HOME MEDICATIONS °5. Take your usually prescribed medications unless otherwise directed. ° °DIET °6. You should follow a light diet the first few days after arrival home.  Be sure to include lots of fluids daily. Avoid fatty, fried foods.  ° °CONSTIPATION °7. It is common to experience some constipation after surgery and if you are taking pain medication.  Increasing fluid intake and taking a stool  softener (such as Colace) will usually help or prevent this problem from occurring.  A mild laxative (Milk of Magnesia or Miralax) should be taken according to package instructions if there are no bowel movements after 48 hours. ° °WOUND/INCISION CARE °8. Most patients will experience some swelling and bruising in the area of the incisions.  Ice packs will help.  Swelling and bruising can take several days to resolve.  °9. Unless discharge instructions indicate otherwise, follow guidelines below  °a. STERI-STRIPS - you may remove your outer bandages 48 hours after surgery, and you may shower at that time.  You have steri-strips (small skin tapes) in place directly over the incision.  These strips should be left on the skin for 7-10 days.   °b. DERMABOND/SKIN GLUE - you may shower in 24 hours.  The glue will flake off over the next 2-3 weeks. °10. Any sutures or staples will be removed at the office during your follow-up visit. ° °ACTIVITIES °11. You may resume regular (light) daily activities beginning the next day--such as daily self-care, walking, climbing stairs--gradually increasing activities as tolerated.  You may have sexual intercourse when it is comfortable.  Refrain from any heavy lifting or straining until approved by your doctor. °a. You may drive when you are no longer taking prescription pain medication, you can comfortably wear a seatbelt, and you can safely maneuver your car and apply brakes. ° °FOLLOW-UP °12. You should see your doctor in the office for a follow-up appointment approximately 2-3 weeks after your surgery.  You should have been given your post-op/follow-up appointment when   your surgery was scheduled.  If you did not receive a post-op/follow-up appointment, make sure that you call for this appointment within a day or two after you arrive home to insure a convenient appointment time. ° °OTHER INSTRUCTIONS ° °WHEN TO CALL YOUR DOCTOR: °1. Fever over 101.0 °2. Inability to  urinate °3. Continued bleeding from incision. °4. Increased pain, redness, or drainage from the incision. °5. Increasing abdominal pain ° °The clinic staff is available to answer your questions during regular business hours.  Please don’t hesitate to call and ask to speak to one of the nurses for clinical concerns.  If you have a medical emergency, go to the nearest emergency room or call 911.  A surgeon from [facility] is always on call at the hospital. °[address], [address], [place], [place]  [address] ? [address] [address], , [place]   [address] °[phone] ? [phone] ? FAX [phone] ° ° ° °

## 2021-02-07 NOTE — Progress Notes (Signed)
PROGRESS NOTE    Gwendolyn Russell  UDJ:[id number] DOB: [birth year] DOA: 02/04/2021 PCP: Sofie Rower, FNP    Brief Narrative:   55 y.o. female with medical history significant of migraines, obesity, anxiety, DVT, GERD, hypertension who presents with worsening of abdominal pain and nausea and vomiting. Pt was found to have acute cholecystitis. General surgery was consulted.  Assessment & Plan:   Principal Problem:   Acute cholecystitis Active Problems:   Migraine   Anxiety   Chronic GERD   Low back pain with sciatica   Essential hypertension   AKI (acute kidney injury) ([place])   Right upper quadrant abdominal pain Cholecystitis ?Choledocholithiasis > Patient presenting with right upper quadrant pain was previously seen for this 2 days ago but pain has worsened and now with minimal wall thickening on ultrasound in addition to gallstones and sludge. > Positive Murphy sign noted on presenting exam  > LFTs have increased from 2 days ago including ALT, ALP and T bili. > General surgery consulted and think her presentation is suspicious for choledocholithiasis and recommend consulting GI GI recommend MRCP -MRCP reviewed. Findings consistent with acute cholecystitis. - Appreciate General Surgery input. Pt is now s/p lap chole on 3/26 -GI following, plan for ERCP, now planned for 3/29 -LFT's are trending down  AKI > In the setting of decreased p.o. intake due to abdominal pain as above.  Presenting creatinine was elevated 1.47 from baseline of around 1. - renal function had improved with hydration  Anxiety/depression - Continued on home prazosin, Xanax, hydroxyzine, nortriptyline  Migraines - Continue home regimen as needed  Hypertension - BP stable at present, currently not on hypertensive meds  Chronic pain  - Continue gabapentin and oxycodone per home regimen  GERD - Continue PPI  History of sleeve gastrectomy - appears stable currently  DVT prophylaxis:  SCD's Code Status: Full Family Communication: Pt in room, family not at bedside  Status is: Inpatient  Requires continued inpatient stay because:  Ongoing diagnostic testing needed not appropriate for outpatient work up and Inpatient level of care appropriate due to severity of illness  Dispo: The patient is from: Home              Anticipated d/c is to: Home              Patient currently is not medically stable to d/c.   Difficult to place patient No  Consultants:   General Surgery  GI  Procedures:   MRICP  Lap chole 3/26  Antimicrobials: Anti-infectives (From admission, onward)   Start     Dose/Rate Route Frequency Ordered Stop   02/05/21 1126  piperacillin-tazobactam (ZOSYN) 3.375 GM/50ML IVPB       Note to Pharmacy: Dara Lords   : cabinet override      02/05/21 1126 02/05/21 1131   02/05/21 0400  piperacillin-tazobactam (ZOSYN) IVPB 3.375 g        3.375 g 12.5 mL/hr over 240 Minutes Intravenous Every 8 hours 02/04/21 2258     02/05/21 0300  piperacillin-tazobactam (ZOSYN) IVPB 3.375 g  Status:  Discontinued        3.375 g 100 mL/hr over 30 Minutes Intravenous Every 6 hours 02/04/21 2244 02/04/21 2258   02/04/21 2015  piperacillin-tazobactam (ZOSYN) IVPB 3.375 g        3.375 g 100 mL/hr over 30 Minutes Intravenous  Once 02/04/21 2014 02/04/21 2053      Subjective: Feeling continued abd soreness  Objective: Vitals:   02/06/21 0245 02/06/21  0813 02/06/21 2047 02/07/21 0533  BP: (!) 144/85 134/79 (!) 141/87 108/67  Pulse: (!) 105 94 (!) 105 (!) 107  Resp:  17 20 18   Temp: 97.7 F (36.5 C) 97.7 F (36.5 C) 98.5 F (36.9 C) 98.8 F (37.1 C)  TempSrc: Oral Oral  Oral  SpO2: 96% 96% 99% 95%  Weight:    107.1 kg  Height:        Intake/Output Summary (Last 24 hours) at 02/07/2021 1433 Last data filed at 02/07/2021 2330 Gross per 24 hour  Intake 120 ml  Output --  Net 120 ml   Filed Weights   02/04/21 1355 02/07/21 0533  Weight: 111.1 kg 107.1 kg     Examination: General exam: Conversant, in no acute distress Respiratory system: normal chest rise, clear, no audible wheezing Cardiovascular system: regular rhythm, s1-s2 Gastrointestinal system: Nondistended, mild soreness Central nervous system: No seizures, no tremors Extremities: No cyanosis, no joint deformities Skin: No rashes, no pallor Psychiatry: Affect normal // no auditory hallucinations   Data Reviewed: I have personally reviewed following labs and imaging studies  CBC: Recent Labs  Lab 02/02/21 1820 02/04/21 1411 02/05/21 0604 02/06/21 0336 02/07/21 0324  WBC 9.3 6.6 6.7 8.5 6.6  HGB 10.8* 10.8* 9.1* 9.0* 8.7*  HCT 34.9* 35.2* 30.0* 29.8* 28.7*  MCV 86.2 86.9 86.2 86.9 87.0  PLT 239 237 185 207 076   Basic Metabolic Panel: Recent Labs  Lab 02/04/21 1411 02/05/21 0604 02/06/21 0336 02/06/21 0906 02/07/21 0324  NA 136 135 138 137 140  K 3.9 3.6 4.4 4.4 4.2  CL 100 103 101 102 102  CO2 23 24 25 24 29   GLUCOSE 132* 107* 156* 134* 125*  BUN 25* 17 10 10 13   CREATININE 1.47* 1.10* 0.90 0.75 1.04*  CALCIUM 8.9 8.4* 9.4 9.3 9.3   GFR: Estimated Creatinine Clearance: 74.8 mL/min (A) (by C-G formula based on SCr of 1.04 mg/dL (H)). Liver Function Tests: Recent Labs  Lab 02/04/21 1411 02/05/21 0604 02/06/21 0336 02/06/21 0906 02/07/21 0324  AST 125* 62* 86* 76* 68*  ALT 333* 221* 207* 185* 168*  ALKPHOS 284* 218* 222* 203* 233*  BILITOT 3.7* 2.9* 3.2* 3.0* 2.2*  PROT 7.6 6.4* 7.3 6.8 6.6  ALBUMIN 3.6 3.0* 3.5 3.4* 3.0*   Recent Labs  Lab 02/02/21 1820 02/04/21 1411  LIPASE 24 19   No results for input(s): AMMONIA in the last 168 hours. Coagulation Profile: No results for input(s): INR, PROTIME in the last 168 hours. Cardiac Enzymes: No results for input(s): CKTOTAL, CKMB, CKMBINDEX, TROPONINI in the last 168 hours. BNP (last 3 results) No results for input(s): PROBNP in the last 8760 hours. HbA1C: No results for input(s): HGBA1C in the  last 72 hours. CBG: No results for input(s): GLUCAP in the last 168 hours. Lipid Profile: No results for input(s): CHOL, HDL, LDLCALC, TRIG, CHOLHDL, LDLDIRECT in the last 72 hours. Thyroid Function Tests: No results for input(s): TSH, T4TOTAL, FREET4, T3FREE, THYROIDAB in the last 72 hours. Anemia Panel: No results for input(s): VITAMINB12, FOLATE, FERRITIN, TIBC, IRON, RETICCTPCT in the last 72 hours. Sepsis Labs: No results for input(s): PROCALCITON, LATICACIDVEN in the last 168 hours.  Recent Results (from the past 240 hour(s))  Resp Panel by RT-PCR (Flu A&B, Covid) Nasopharyngeal Swab     Status: None   Collection Time: 02/04/21  6:43 PM   Specimen: Nasopharyngeal Swab; Nasopharyngeal(NP) swabs in vial transport medium  Result Value Ref Range Status   SARS Coronavirus  2 by RT PCR NEGATIVE NEGATIVE Final    Comment: (NOTE) SARS-CoV-2 target nucleic acids are NOT DETECTED.  The SARS-CoV-2 RNA is generally detectable in upper respiratory specimens during the acute phase of infection. The lowest concentration of SARS-CoV-2 viral copies this assay can detect is 138 copies/mL. A negative result does not preclude SARS-Cov-2 infection and should not be used as the sole basis for treatment or other patient management decisions. A negative result may occur with  improper specimen collection/handling, submission of specimen other than nasopharyngeal swab, presence of viral mutation(s) within the areas targeted by this assay, and inadequate number of viral copies(<138 copies/mL). A negative result must be combined with clinical observations, patient history, and epidemiological information. The expected result is Negative.  Fact Sheet for Patients:  EntrepreneurPulse.com.au  Fact Sheet for Healthcare Providers:  IncredibleEmployment.be  This test is no t yet approved or cleared by the [place] FDA and  has been authorized for detection and/or  diagnosis of SARS-CoV-2 by FDA under an Emergency Use Authorization (EUA). This EUA will remain  in effect (meaning this test can be used) for the duration of the COVID-19 declaration under Section 564(b)(1) of the Act, 21 U.S.C.section 360bbb-3(b)(1), unless the authorization is terminated  or revoked sooner.       Influenza A by PCR NEGATIVE NEGATIVE Final   Influenza B by PCR NEGATIVE NEGATIVE Final    Comment: (NOTE) The Xpert Xpress SARS-CoV-2/FLU/RSV plus assay is intended as an aid in the diagnosis of influenza from Nasopharyngeal swab specimens and should not be used as a sole basis for treatment. Nasal washings and aspirates are unacceptable for Xpert Xpress SARS-CoV-2/FLU/RSV testing.  Fact Sheet for Patients: EntrepreneurPulse.com.au  Fact Sheet for Healthcare Providers: IncredibleEmployment.be  This test is not yet approved or cleared by the [place] FDA and has been authorized for detection and/or diagnosis of SARS-CoV-2 by FDA under an Emergency Use Authorization (EUA). This EUA will remain in effect (meaning this test can be used) for the duration of the COVID-19 declaration under Section 564(b)(1) of the Act, 21 U.S.C. section 360bbb-3(b)(1), unless the authorization is terminated or revoked.  Performed at [facility], [place] [address], [place], [place] [address]      Radiology Studies: No results found.  Scheduled Meds: . gabapentin  600 mg Oral TID  . hydrOXYzine  25 mg Oral QHS  . indomethacin  100 mg Rectal Once  . nortriptyline  100 mg Oral QHS  . OXcarbazepine  150 mg Oral Daily  . Oxcarbazepine  300 mg Oral QHS  . pantoprazole  40 mg Oral BID  . prazosin  5 mg Oral QHS   Continuous Infusions: . sodium chloride    . piperacillin-tazobactam (ZOSYN)  IV 3.375 g (02/07/21 0319)     LOS: 1 day   Marylu Lund, MD [facility] Pager On Amion  If 7PM-7AM, please contact  night-coverage 02/07/2021, 2:33 PM

## 2021-02-07 NOTE — Progress Notes (Addendum)
Progress Note   Subjective  Chief Complaint: Elevated LFTs, abdominal pain  This morning, patient is seen with her husband by her bedside.  They are both very angry with the way that things have played out since patient has been hospitalized.  We spent a long time discussing all of the questions that they have.  As far as symptoms are concerned patient has mild right upper quadrant abdominal pain, she has not had much of a bowel movement, continued with nausea and is hungry.   Objective   Vital signs in last 24 hours: Temp:  [98.5 F (36.9 C)-98.8 F (37.1 C)] 98.8 F (37.1 C) (03/28 0533) Pulse Rate:  [105-107] 107 (03/28 0533) Resp:  [18-20] 18 (03/28 0533) BP: (108-141)/(67-87) 108/67 (03/28 0533) SpO2:  [95 %-99 %] 95 % (03/28 0533) Weight:  [107.1 kg] 107.1 kg (03/28 0533) Last BM Date: 02/03/21 General:    Obese AA female in NAD Heart:  Regular rate and rhythm; no murmurs Lungs: Respirations even and unlabored, lungs CTA bilaterally Abdomen:  Soft, Moderate RUQ ttp and nondistended. Normal bowel sounds. Psych:  Angry  Intake/Output from previous day: 03/27 0701 - 03/28 0700 In: 120 [P.O.:120] Out: -   Lab Results: Recent Labs    02/05/21 0604 02/06/21 0336 02/07/21 0324  WBC 6.7 8.5 6.6  HGB 9.1* 9.0* 8.7*  HCT 30.0* 29.8* 28.7*  PLT 185 207 234   BMET Recent Labs    02/06/21 0336 02/06/21 0906 02/07/21 0324  NA 138 137 140  K 4.4 4.4 4.2  CL 101 102 102  CO2 25 24 29   GLUCOSE 156* 134* 125*  BUN 10 10 13   CREATININE 0.90 0.75 1.04*  CALCIUM 9.4 9.3 9.3   LFT Recent Labs    02/07/21 0324  PROT 6.6  ALBUMIN 3.0*  AST 68*  ALT 168*  ALKPHOS 233*  BILITOT 2.2*     Assessment / Plan:   Assessment: 1.  Elevated LFTs: Most likely choledocholithiasis, patient status post lap cholecystectomy for gallstones and acute cholecystitis, LFTs remained elevated 2.  Morbid obesity status post sleeve gastrectomy in 2016 3.  GERD 4.  Chronic back  pain and sciatica 5.  Anxiety disorder  Plan: 1.  Will allow patient to have a low-fat diet today and n.p.o. at midnight. 2.  Plans for ERCP tomorrow around noon with Dr. Lyndel Safe. 3.  Continue other supportive measures 4.  Did discuss with the patient and her husband that Dr. Lyndel Safe would be around later today to answer any other questions that they had.  Tried to apologize for any miscommunication or scheduling errors that they have experience.  Thank you for your kind consultation, we will continue to follow.    LOS: 1 day   Levin Erp  02/07/2021, 12:47 PM   Attending physician's note   I have taken an interval history, reviewed the chart and examined the patient. I agree with the Advanced Practitioner's note, impression and recommendations.   Suspected choledocholithiasis with abn LFTs. S/P lap chole with IOC 3/26 (contrast did not go into the duodenum) Obesity s/p sleeve gastrectomy 2016 Fatty liver. FH NASH cirrhosis (mom)  Plan: -ERCP in a.m. Discussed risks and benefits including small but definite risks of pancreatitis, bleeding, perforation, risks of anesthesia.  Benefits also discussed.  She wishes to proceed. -I also discussed with the pt's husband. -Once better, recommend to start walking, watch calorie intake and reduce weight.  She is highly motivated to do so.  Carmell Austria, MD [facility] [phone]

## 2021-02-07 NOTE — Progress Notes (Signed)
[facility] Progress Note  2 Days Post-Op  Subjective: CC-  Several complaints this morning. Upset about blood work, being woken up every 2 hours, still NPO and does not know the plan. Abdomen sore but this is well controlled. Tolerated diet last night for dinner without n/v.  TBili down to 2.2 from 3.  Objective: Vital signs in last 24 hours: Temp:  [98.5 F (36.9 C)-98.8 F (37.1 C)] 98.8 F (37.1 C) (03/28 0533) Pulse Rate:  [105-107] 107 (03/28 0533) Resp:  [18-20] 18 (03/28 0533) BP: (108-141)/(67-87) 108/67 (03/28 0533) SpO2:  [95 %-99 %] 95 % (03/28 0533) Weight:  [107.1 kg] 107.1 kg (03/28 0533) Last BM Date: 02/03/21  Intake/Output from previous day: 03/27 0701 - 03/28 0700 In: 120 [P.O.:120] Out: -  Intake/Output this shift: No intake/output data recorded.  PE: Gen:  Alert, NAD Pulm:  rate and effort normal Abd: Soft, appropriately tender, lap incisions C/D/I without erythema or drainage  Lab Results:  Recent Labs    02/06/21 0336 02/07/21 0324  WBC 8.5 6.6  HGB 9.0* 8.7*  HCT 29.8* 28.7*  PLT 207 234   BMET Recent Labs    02/06/21 0906 02/07/21 0324  NA 137 140  K 4.4 4.2  CL 102 102  CO2 24 29  GLUCOSE 134* 125*  BUN 10 13  CREATININE 0.75 1.04*  CALCIUM 9.3 9.3   PT/INR No results for input(s): LABPROT, INR in the last 72 hours. CMP     Component Value Date/Time   NA 140 02/07/2021 0324   K 4.2 02/07/2021 0324   CL 102 02/07/2021 0324   CO2 29 02/07/2021 0324   GLUCOSE 125 (H) 02/07/2021 0324   BUN 13 02/07/2021 0324   CREATININE 1.04 (H) 02/07/2021 0324   CALCIUM 9.3 02/07/2021 0324   PROT 6.6 02/07/2021 0324   ALBUMIN 3.0 (L) 02/07/2021 0324   AST 68 (H) 02/07/2021 0324   ALT 168 (H) 02/07/2021 0324   ALKPHOS 233 (H) 02/07/2021 0324   BILITOT 2.2 (H) 02/07/2021 0324   GFRNONAA >60 02/07/2021 0324   GFRAA >60 01/28/2016 1040   Lipase     Component Value Date/Time   LIPASE 19 02/04/2021 1411        Studies/Results: DG Cholangiogram Operative  Result Date: 02/05/2021 CLINICAL DATA:  Cholelithiasis EXAM: INTRAOPERATIVE CHOLANGIOGRAM TECHNIQUE: Cholangiographic images from the C-arm fluoroscopic device were submitted for interpretation post-operatively. Please see the procedural report for the amount of contrast and the fluoroscopy time utilized. COMPARISON:  MRCP 02/04/2021 FLUOROSCOPY TIME:  0 minutes 25 seconds Dose: 25.70 mGy FINDINGS: Two intraoperative imaging series. Common hepatic duct and common bile duct appear normal in caliber. Visualized intrahepatic ducts normal appearance. No filling defects or stricture identified. Spillage of contrast into the duodenum is not demonstrated on the submitted series. Tapering at distal CBD. IMPRESSION: No evidence of choledocholithiasis or CBD stricture on submitted series. No spillage of contrast into the duodenum is present on the submitted series to confirm distal CBD patency. Electronically Signed   By: Lavonia Dana M.D.   On: 02/05/2021 12:25    Anti-infectives: Anti-infectives (From admission, onward)   Start     Dose/Rate Route Frequency Ordered Stop   02/05/21 1126  piperacillin-tazobactam (ZOSYN) 3.375 GM/50ML IVPB       Note to Pharmacy: Dara Lords   : cabinet override      02/05/21 1126 02/05/21 1131   02/05/21 0400  piperacillin-tazobactam (ZOSYN) IVPB 3.375 g  3.375 g 12.5 mL/hr over 240 Minutes Intravenous Every 8 hours 02/04/21 2258     02/05/21 0300  piperacillin-tazobactam (ZOSYN) IVPB 3.375 g  Status:  Discontinued        3.375 g 100 mL/hr over 30 Minutes Intravenous Every 6 hours 02/04/21 2244 02/04/21 2258   02/04/21 2015  piperacillin-tazobactam (ZOSYN) IVPB 3.375 g        3.375 g 100 mL/hr over 30 Minutes Intravenous  Once 02/04/21 2014 02/04/21 2053       Assessment/Plan Obesity BMI 38 Anxiety Hx DVT  GERD HTN Chronic pain Hx sleeve gastrectomy   Acute cholecystitis  S/P laparoscopic  cholecystectomy with IOC 3/26 Dr. Ninfa Linden - POD#2 - IOC showed no evidence of choledocholithiasis or CBD stricture but no contrast went into duodenum to confirm distal CBD patency. preop MRCP also questioned sludge in common bile duct  - TBili 2.2 from 3, GI following for possible ERCP - Doing well from surgical standpoint. Possible ERCP per GI. I will place follow up and discharge instructions on AVS and send rx for oxy to her pharmacy.   ID - zosyn 3/25>> (does not need more abx from surgical standpoint) FEN - IVF, NPO VTE - ok for chemical DVT prophylaxis from surgical standpoint Foley - none Follow up - [facility]   LOS: 1 day    Wellington Hampshire, [facility] 02/07/2021, 10:49 AM Please see Amion for pager number during day hours 7:00am-4:30pm

## 2021-02-07 NOTE — H&P (View-Only) (Signed)
Progress Note   Subjective  Chief Complaint: Elevated LFTs, abdominal pain  This morning, patient is seen with her husband by her bedside.  They are both very angry with the way that things have played out since patient has been hospitalized.  We spent a long time discussing all of the questions that they have.  As far as symptoms are concerned patient has mild right upper quadrant abdominal pain, she has not had much of a bowel movement, continued with nausea and is hungry.   Objective   Vital signs in last 24 hours: Temp:  [98.5 F (36.9 C)-98.8 F (37.1 C)] 98.8 F (37.1 C) (03/28 0533) Pulse Rate:  [105-107] 107 (03/28 0533) Resp:  [18-20] 18 (03/28 0533) BP: (108-141)/(67-87) 108/67 (03/28 0533) SpO2:  [95 %-99 %] 95 % (03/28 0533) Weight:  [107.1 kg] 107.1 kg (03/28 0533) Last BM Date: 02/03/21 General:    Obese AA female in NAD Heart:  Regular rate and rhythm; no murmurs Lungs: Respirations even and unlabored, lungs CTA bilaterally Abdomen:  Soft, Moderate RUQ ttp and nondistended. Normal bowel sounds. Psych:  Angry  Intake/Output from previous day: 03/27 0701 - 03/28 0700 In: 120 [P.O.:120] Out: -   Lab Results: Recent Labs    02/05/21 0604 02/06/21 0336 02/07/21 0324  WBC 6.7 8.5 6.6  HGB 9.1* 9.0* 8.7*  HCT 30.0* 29.8* 28.7*  PLT 185 207 234   BMET Recent Labs    02/06/21 0336 02/06/21 0906 02/07/21 0324  NA 138 137 140  K 4.4 4.4 4.2  CL 101 102 102  CO2 25 24 29   GLUCOSE 156* 134* 125*  BUN 10 10 13   CREATININE 0.90 0.75 1.04*  CALCIUM 9.4 9.3 9.3   LFT Recent Labs    02/07/21 0324  PROT 6.6  ALBUMIN 3.0*  AST 68*  ALT 168*  ALKPHOS 233*  BILITOT 2.2*     Assessment / Plan:   Assessment: 1.  Elevated LFTs: Most likely choledocholithiasis, patient status post lap cholecystectomy for gallstones and acute cholecystitis, LFTs remained elevated 2.  Morbid obesity status post sleeve gastrectomy in 2016 3.  GERD 4.  Chronic back  pain and sciatica 5.  Anxiety disorder  Plan: 1.  Will allow patient to have a low-fat diet today and n.p.o. at midnight. 2.  Plans for ERCP tomorrow around noon with Dr. Lyndel Safe. 3.  Continue other supportive measures 4.  Did discuss with the patient and her husband that Dr. Lyndel Safe would be around later today to answer any other questions that they had.  Tried to apologize for any miscommunication or scheduling errors that they have experience.  Thank you for your kind consultation, we will continue to follow.    LOS: 1 day   Levin Erp  02/07/2021, 12:47 PM   Attending physician's note   I have taken an interval history, reviewed the chart and examined the patient. I agree with the Advanced Practitioner's note, impression and recommendations.   Suspected choledocholithiasis with abn LFTs. S/P lap chole with IOC 3/26 (contrast did not go into the duodenum) Obesity s/p sleeve gastrectomy 2016 Fatty liver. FH NASH cirrhosis (mom)  Plan: -ERCP in a.m. Discussed risks and benefits including small but definite risks of pancreatitis, bleeding, perforation, risks of anesthesia.  Benefits also discussed.  She wishes to proceed. -I also discussed with the pt's husband. -Once better, recommend to start walking, watch calorie intake and reduce weight.  She is highly motivated to do so.  Carmell Austria, MD [facility] [phone]

## 2021-02-08 ENCOUNTER — Inpatient Hospital Stay (HOSPITAL_COMMUNITY): Payer: BC Managed Care – PPO | Admitting: Certified Registered Nurse Anesthetist

## 2021-02-08 ENCOUNTER — Encounter (HOSPITAL_COMMUNITY)
Admission: EM | Disposition: A | Discharge status: Home / Self Care | Payer: Self-pay | Source: Home / Self Care | Attending: Internal Medicine

## 2021-02-08 ENCOUNTER — Encounter (HOSPITAL_COMMUNITY): Payer: Self-pay | Admitting: Internal Medicine

## 2021-02-08 ENCOUNTER — Inpatient Hospital Stay (HOSPITAL_COMMUNITY): Payer: BC Managed Care – PPO

## 2021-02-08 HISTORY — PX: SPHINCTEROTOMY: SHX5544

## 2021-02-08 HISTORY — PX: REMOVAL OF STONES: SHX5545

## 2021-02-08 HISTORY — PX: ERCP: SHX5425

## 2021-02-08 LAB — COMPREHENSIVE METABOLIC PANEL
ALT: 133 U/L — ABNORMAL HIGH (ref 0–44)
AST: 46 U/L — ABNORMAL HIGH (ref 15–41)
Albumin: 3 g/dL — ABNORMAL LOW (ref 3.5–5.0)
Alkaline Phosphatase: 266 U/L — ABNORMAL HIGH (ref 38–126)
Anion gap: 8 (ref 5–15)
BUN: 14 mg/dL (ref 6–20)
CO2: 29 mmol/L (ref 22–32)
Calcium: 8.8 mg/dL — ABNORMAL LOW (ref 8.9–10.3)
Chloride: 101 mmol/L (ref 98–111)
Creatinine, Ser: 1.11 mg/dL — ABNORMAL HIGH (ref 0.44–1.00)
GFR, Estimated: 58 mL/min — ABNORMAL LOW (ref >60)
Glucose, Bld: 102 mg/dL — ABNORMAL HIGH (ref 70–99)
Potassium: 4.8 mmol/L (ref 3.5–5.1)
Sodium: 138 mmol/L (ref 135–145)
Total Bilirubin: 1.5 mg/dL — ABNORMAL HIGH (ref 0.3–1.2)
Total Protein: 6.5 g/dL (ref 6.5–8.1)

## 2021-02-08 LAB — SURGICAL PATHOLOGY

## 2021-02-08 SURGERY — ERCP, WITH INTERVENTION IF INDICATED
Anesthesia: General

## 2021-02-08 MED ORDER — DEXAMETHASONE SODIUM PHOSPHATE 4 MG/ML IJ SOLN
INTRAMUSCULAR | Status: DC | PRN
  Administered 2021-02-08: 10 mg via INTRAVENOUS

## 2021-02-08 MED ORDER — PROPOFOL 10 MG/ML IV BOLUS
INTRAVENOUS | Status: DC | PRN
  Administered 2021-02-08: 200 mg via INTRAVENOUS

## 2021-02-08 MED ORDER — SODIUM CHLORIDE 0.9 % IV SOLN
INTRAVENOUS | Status: DC | PRN
  Administered 2021-02-08: 35 mL

## 2021-02-08 MED ORDER — LIDOCAINE 2% (20 MG/ML) 5 ML SYRINGE
INTRAMUSCULAR | Status: DC | PRN
  Administered 2021-02-08: 50 mg via INTRAVENOUS

## 2021-02-08 MED ORDER — GLUCAGON HCL RDNA (DIAGNOSTIC) 1 MG IJ SOLR
INTRAMUSCULAR | Status: DC | PRN
  Administered 2021-02-08: .25 mg via INTRAVENOUS
  Administered 2021-02-08: .5 mg via INTRAVENOUS

## 2021-02-08 MED ORDER — LACTATED RINGERS IV SOLN: INTRAVENOUS | Status: DC | PRN

## 2021-02-08 MED ORDER — AMISULPRIDE (ANTIEMETIC) 5 MG/2ML IV SOLN
INTRAVENOUS | Status: DC | PRN
  Administered 2021-02-08: 5 mg via INTRAVENOUS

## 2021-02-08 MED ORDER — ROCURONIUM BROMIDE 10 MG/ML (PF) SYRINGE
PREFILLED_SYRINGE | INTRAVENOUS | Status: DC | PRN
  Administered 2021-02-08: 40 mg via INTRAVENOUS
  Administered 2021-02-08: 10 mg via INTRAVENOUS

## 2021-02-08 MED ORDER — INDOMETHACIN 50 MG RE SUPP
RECTAL | Status: DC | PRN
  Administered 2021-02-08: 100 mg via RECTAL

## 2021-02-08 MED ORDER — MIDAZOLAM HCL 2 MG/2ML IJ SOLN
INTRAMUSCULAR | Status: AC
  Filled 2021-02-08: qty 2

## 2021-02-08 MED ORDER — AMISULPRIDE (ANTIEMETIC) 5 MG/2ML IV SOLN
INTRAVENOUS | Status: AC
  Filled 2021-02-08: qty 2

## 2021-02-08 MED ORDER — PHENYLEPHRINE 40 MCG/ML (10ML) SYRINGE FOR IV PUSH (FOR BLOOD PRESSURE SUPPORT)
PREFILLED_SYRINGE | INTRAVENOUS | Status: DC | PRN
  Administered 2021-02-08: 80 ug via INTRAVENOUS
  Administered 2021-02-08: 120 ug via INTRAVENOUS
  Administered 2021-02-08: 80 ug via INTRAVENOUS
  Administered 2021-02-08: 120 ug via INTRAVENOUS

## 2021-02-08 MED ORDER — MIDAZOLAM HCL 5 MG/5ML IJ SOLN
INTRAMUSCULAR | Status: DC | PRN
  Administered 2021-02-08: 2 mg via INTRAVENOUS

## 2021-02-08 MED ORDER — METOPROLOL TARTRATE 5 MG/5ML IV SOLN
INTRAVENOUS | Status: DC | PRN
  Administered 2021-02-08: 2 mg via INTRAVENOUS

## 2021-02-08 MED ORDER — GLUCAGON HCL RDNA (DIAGNOSTIC) 1 MG IJ SOLR
INTRAMUSCULAR | Status: AC
  Filled 2021-02-08: qty 1

## 2021-02-08 MED ORDER — SUGAMMADEX SODIUM 200 MG/2ML IV SOLN
INTRAVENOUS | Status: DC | PRN
  Administered 2021-02-08: 200 mg via INTRAVENOUS

## 2021-02-08 MED ORDER — FENTANYL CITRATE (PF) 100 MCG/2ML IJ SOLN
INTRAMUSCULAR | Status: DC | PRN
  Administered 2021-02-08: 100 ug via INTRAVENOUS

## 2021-02-08 MED ORDER — PROPOFOL 10 MG/ML IV BOLUS
INTRAVENOUS | Status: AC
  Filled 2021-02-08: qty 20

## 2021-02-08 MED ORDER — INDOMETHACIN 50 MG RE SUPP
RECTAL | Status: AC
  Filled 2021-02-08: qty 2

## 2021-02-08 MED ORDER — ONDANSETRON HCL 4 MG/2ML IJ SOLN
INTRAMUSCULAR | Status: DC | PRN
  Administered 2021-02-08: 4 mg via INTRAVENOUS

## 2021-02-08 MED ORDER — FENTANYL CITRATE (PF) 100 MCG/2ML IJ SOLN
INTRAMUSCULAR | Status: AC
  Filled 2021-02-08: qty 2

## 2021-02-08 NOTE — Progress Notes (Signed)
  POST ERCP NOTE:  Pt doing well. No abdominal pain or nausea or melena. Vitals stable. Abdominal Exam: soft, nontender. Discussed with family.  Advance diet to full liquid diet  Carmell Austria

## 2021-02-08 NOTE — Anesthesia Procedure Notes (Signed)
Procedure Name: Intubation Date/Time: 02/08/2021 12:33 PM Performed by: Claudia Desanctis, CRNA Pre-anesthesia Checklist: Patient identified, Emergency Drugs available, Suction available and Patient being monitored Patient Re-evaluated:Patient Re-evaluated prior to induction Oxygen Delivery Method: Circle system utilized Preoxygenation: Pre-oxygenation with 100% oxygen Induction Type: IV induction Ventilation: Mask ventilation without difficulty Laryngoscope Size: 2 and Miller Grade View: Grade I Tube type: Oral Tube size: 7.0 mm Number of attempts: 1 Airway Equipment and Method: Stylet Placement Confirmation: ETT inserted through vocal cords under direct vision,  positive ETCO2 and breath sounds checked- equal and bilateral Secured at: 21 cm Tube secured with: Tape Dental Injury: Teeth and Oropharynx as per pre-operative assessment

## 2021-02-08 NOTE — Progress Notes (Signed)
[facility] Progress Note  Day of Surgery  Subjective: CC-  Seen in endo. Just finished ERCP where sludge was swept from the duct and no definite formed stones were found, postocclusion cholangiogram was normal. Per GI if she feels better can D/C home later today or early morning tomorrow. Patient prefers tomorrow to ensure no complications.  Objective: Vital signs in last 24 hours: Temp:  [98 F (36.7 C)-98.8 F (37.1 C)] 98.8 F (37.1 C) (03/29 1341) Pulse Rate:  [85-109] 89 (03/29 1351) Resp:  [14-22] 15 (03/29 1351) BP: (103-132)/(65-81) 129/81 (03/29 1351) SpO2:  [92 %-99 %] 92 % (03/29 1351) Weight:  [112.8 kg] 112.8 kg (03/29 0500) Last BM Date: 02/03/21  Intake/Output from previous day: 03/28 0701 - 03/29 0700 In: 472 [P.O.:472] Out: -  Intake/Output this shift: Total I/O In: 500 [I.V.:500] Out: 0   PE: Gen:  Alert, NAD Pulm:  rate and effort normal Abd: Soft, NT, lap incisions C/D/I without erythema or drainage   Lab Results:  Recent Labs    02/06/21 0336 02/07/21 0324  WBC 8.5 6.6  HGB 9.0* 8.7*  HCT 29.8* 28.7*  PLT 207 234   BMET Recent Labs    02/07/21 0324 02/08/21 0356  NA 140 138  K 4.2 4.8  CL 102 101  CO2 29 29  GLUCOSE 125* 102*  BUN 13 14  CREATININE 1.04* 1.11*  CALCIUM 9.3 8.8*   PT/INR No results for input(s): LABPROT, INR in the last 72 hours. CMP     Component Value Date/Time   NA 138 02/08/2021 0356   K 4.8 02/08/2021 0356   CL 101 02/08/2021 0356   CO2 29 02/08/2021 0356   GLUCOSE 102 (H) 02/08/2021 0356   BUN 14 02/08/2021 0356   CREATININE 1.11 (H) 02/08/2021 0356   CALCIUM 8.8 (L) 02/08/2021 0356   PROT 6.5 02/08/2021 0356   ALBUMIN 3.0 (L) 02/08/2021 0356   AST 46 (H) 02/08/2021 0356   ALT 133 (H) 02/08/2021 0356   ALKPHOS 266 (H) 02/08/2021 0356   BILITOT 1.5 (H) 02/08/2021 0356   GFRNONAA 58 (L) 02/08/2021 0356   GFRAA >60 01/28/2016 1040   Lipase     Component Value Date/Time   LIPASE  19 02/04/2021 1411       Studies/Results: DG ERCP BILIARY & PANCREATIC DUCTS  Result Date: 02/08/2021 CLINICAL DATA:  55 year old female with cholelithiasis EXAM: ERCP TECHNIQUE: Multiple spot images obtained with the fluoroscopic device and submitted for interpretation post-procedure. FLUOROSCOPY TIME:  Fluoroscopy Time:  1 minutes 24 seconds COMPARISON:  MR 02/04/2021, intraoperative cholangiogram 02/05/2021 FINDINGS: Limited intraoperative fluoroscopic spot images during ERCP. Initial image demonstrates the endoscope projecting over the upper abdomen. Surgical changes of cholecystectomy. Subsequently there is cannulation of the ampulla and retrograde infusion of contrast. Deployment of a retrieval balloon. IMPRESSION: Limited images during ERCP demonstrates deployment of a retrieval balloon for treatment of choledocholithiasis. Please refer to the dictated operative report for full details of intraoperative findings and procedure. Electronically Signed   By: Corrie Mckusick D.O.   On: 02/08/2021 13:42    Anti-infectives: Anti-infectives (From admission, onward)   Start     Dose/Rate Route Frequency Ordered Stop   02/05/21 1126  piperacillin-tazobactam (ZOSYN) 3.375 GM/50ML IVPB       Note to Pharmacy: Dara Lords   : cabinet override      02/05/21 1126 02/05/21 1131   02/05/21 0400  [MAR Hold]  piperacillin-tazobactam (ZOSYN) IVPB 3.375 g        (  MAR Hold since Tue 02/08/2021 at 1113.Hold Reason: Transfer to a Procedural area.)   3.375 g 12.5 mL/hr over 240 Minutes Intravenous Every 8 hours 02/04/21 2258     02/05/21 0300  piperacillin-tazobactam (ZOSYN) IVPB 3.375 g  Status:  Discontinued        3.375 g 100 mL/hr over 30 Minutes Intravenous Every 6 hours 02/04/21 2244 02/04/21 2258   02/04/21 2015  piperacillin-tazobactam (ZOSYN) IVPB 3.375 g        3.375 g 100 mL/hr over 30 Minutes Intravenous  Once 02/04/21 2014 02/04/21 2053       Assessment/Plan Obesity BMI 38 Anxiety Hx DVT   GERD HTN Chronic pain Hx sleeve gastrectomy   Acute cholecystitis  S/P laparoscopic cholecystectomy with IOC 3/26 Dr. Ninfa Linden - POD#3 - IOC showed no evidence of choledocholithiasis or CBD stricture but no contrast went into duodenum to confirm distal CBD patency. preop MRCP also questioned sludge in common bile duct  S/p ERCP 3/29 Dr. Lyndel Safe - sludge was swept from the duct and no definite formed stones were found, postocclusion cholangiogram was normal  ID - zosyn 3/25>> (does not need more abx from surgical standpoint) FEN - IVF, CLD per GI beginning at 1600 VTE - ok for chemical DVT prophylaxis from surgical standpoint Foley - none Follow up - [facility]   Plan: [place] for discharge home from surgical standpoint when recovered from ERCP. Follow up info and discharge instructions are on AVS, and rx for oxy has been sent to her pharmacy.   LOS: 2 days    Wellington Hampshire, [facility] 02/08/2021, 2:01 PM Please see Amion for pager number during day hours 7:00am-4:30pm

## 2021-02-08 NOTE — Op Note (Signed)
[facility] Patient Name: Gwendolyn Russell Procedure Date: 02/08/2021 MRN: [id number] Attending MD: Jackquline Denmark , MD Date of Birth: [birth year] CSN: [id number] Age: 55 Admit Type: Inpatient Procedure:                ERCP Indications:              Bile duct stone(s) with abnormal LFTs. S/P                            cholecystectomy Providers:                Jackquline Denmark, MD, Kary Kos RN, RN, Elspeth Cho Tech., Technician, Dellie Catholic Referring MD:              Medicines:                General Anesthesia, Indocin suppositories, IV Zosyn Complications:            No immediate complications. Estimated Blood Loss:     Estimated blood loss: none. Procedure:                Pre-Anesthesia Assessment:                           - Prior to the procedure, a History and Physical                            was performed, and patient medications and                            allergies were reviewed. The patient's tolerance of                            previous anesthesia was also reviewed. The risks                            and benefits of the procedure and the sedation                            options and risks were discussed with the patient.                            All questions were answered, and informed consent                            was obtained. Prior Anticoagulants: The patient has                            taken no previous anticoagulant or antiplatelet                            agents. ASA Grade Assessment: II - A patient with  mild systemic disease. After reviewing the risks                            and benefits, the patient was deemed in                            satisfactory condition to undergo the procedure.                           After obtaining informed consent, the scope was                            passed under direct vision. Throughout the                            procedure, the  patient's blood pressure, pulse, and                            oxygen saturations were monitored continuously. The                            [employer] single use                            duodenoscope was introduced through the mouth, and                            used to inject contrast into and used to inject                            contrast into the bile duct. The ERCP was                            accomplished without difficulty. The patient                            tolerated the procedure well. Scope In: Scope Out: Findings:      The scout film was normal. The esophagus was successfully intubated       under direct vision. The scope was advanced to a normal major papilla in       the descending duodenum without detailed examination of the pharynx,       larynx and associated structures, and upper GI tract. The upper GI tract       was grossly normal. The exam was limited as it was a side-viewing scope.       There was evidence of previous sleeve gastrectomy.      The major papilla was normal. There was periampullary diverticula. The       bile duct was deeply cannulated with the short-nosed traction       sphincterotome using wire-guided technique. Contrast was injected. I       personally interpreted the bile duct images. Ductal flow of contrast was       adequate. Image quality was adequate. Contrast extended to the entire       biliary tree.  Small filling defects were noted in the distal CBD. CBD measured 8 mm.       The right and the left hepatic ducts were normal. The cystic duct       remnant did fill. There was surgical clips consistent with       cholecystectomy. There were no leaks.      An 8 mm biliary sphincterotomy was made with a traction (standard)       sphincterotome using ERBE electrocautery in endocut mode. There was no       post-sphincterotomy bleeding. The biliary tree was swept with a 10 mm       balloon starting at the  bifurcation. Sludge was swept from the duct.       There were no definite well-formed stones. Postocclusion cholangiogram       was normal.      Pancreatic duct intentionally was not cannulated. Impression:               - Choledocholithiasis s/p biliary sphincterotomy                            with balloon sweep followed by postocclusion                            cholangiogram.                           - S/P cholecystectomy. Moderate Sedation:      Not Applicable - Patient had care per Anesthesia. Recommendation:           - Return patient to hospital ward for ongoing care.                           - Watch for pancreatitis, bleeding, perforation,                            and cholangitis.                           - Clear liquid diet starting at 4 PM.                           - D/W pt's husband.                           - If feels better, can D/C home later today or                            early morning tomorrow. Procedure Code(s):        --- Professional ---                           [phone], Endoscopic retrograde                            cholangiopancreatography (ERCP); with removal of                            calculi/debris from biliary/pancreatic duct(s)  43262, Endoscopic retrograde                            cholangiopancreatography (ERCP); with                            sphincterotomy/papillotomy                           [phone], Endoscopic catheterization of the biliary                            ductal system, radiological supervision and                            interpretation Diagnosis Code(s):        --- Professional ---                           K80.50, Calculus of bile duct without cholangitis                            or cholecystitis without obstruction CPT copyright 2019 [employer]. All rights reserved. The codes documented in this report are preliminary and upon coder review may  be revised to meet current  compliance requirements. Jackquline Denmark, MD 02/08/2021 1:37:51 PM This report has been signed electronically. Number of Addenda: 0

## 2021-02-08 NOTE — Interval H&P Note (Signed)
History and Physical Interval Note:  02/08/2021 12:18 PM  Gwendolyn Russell  has presented today for surgery, with the diagnosis of Elevated LFT's.  The various methods of treatment have been discussed with the patient and family. After consideration of risks, benefits and other options for treatment, the patient has consented to  Procedure(s): ENDOSCOPIC RETROGRADE CHOLANGIOPANCREATOGRAPHY (ERCP) (N/A) as a surgical intervention.  The patient's history has been reviewed, patient examined, no change in status, stable for surgery.  I have reviewed the patient's chart and labs.  Questions were answered to the patient's satisfaction.     Jackquline Denmark

## 2021-02-08 NOTE — Anesthesia Preprocedure Evaluation (Signed)
Anesthesia Evaluation  Patient identified by MRN, date of birth, ID band Patient awake    Reviewed: Allergy & Precautions, NPO status , Patient's Chart, lab work & pertinent test results  History of Anesthesia Complications (+) PONV and history of anesthetic complications  Airway Mallampati: II  TM Distance: >3 FB Neck ROM: Full    Dental no notable dental hx.    Pulmonary neg pulmonary ROS,    Pulmonary exam normal breath sounds clear to auscultation       Cardiovascular hypertension, + DVT   Rhythm:Regular Rate:Tachycardia  ECG: SR, rate 91   Neuro/Psych  Headaches, PSYCHIATRIC DISORDERS Anxiety Depression    GI/Hepatic Neg liver ROS, GERD  Medicated and Controlled,  Endo/Other  Morbid obesity  Renal/GU Renal disease     Musculoskeletal  (+) Arthritis ,   Abdominal (+) + obese,   Peds  Hematology  (+) anemia ,   Anesthesia Other Findings Elevated LFT's  Reproductive/Obstetrics                             Anesthesia Physical Anesthesia Plan  ASA: III  Anesthesia Plan: General   Post-op Pain Management:    Induction: Intravenous  PONV Risk Score and Plan: 4 or greater and Ondansetron, Dexamethasone, Midazolam, Treatment may vary due to age or medical condition and Amisulpride  Airway Management Planned: Oral ETT  Additional Equipment:   Intra-op Plan:   Post-operative Plan: Extubation in OR  Informed Consent: I have reviewed the patients History and Physical, chart, labs and discussed the procedure including the risks, benefits and alternatives for the proposed anesthesia with the patient or authorized representative who has indicated his/her understanding and acceptance.     Dental advisory given  Plan Discussed with: CRNA  Anesthesia Plan Comments:         Anesthesia Quick Evaluation

## 2021-02-08 NOTE — Progress Notes (Signed)
PROGRESS NOTE    Gwendolyn Russell  RJJ:[id number] DOB: 07/19/1966 DOA: 02/04/2021 PCP: Sofie Rower, FNP    Brief Narrative:   55 y.o. female with medical history significant of migraines, obesity, anxiety, DVT, GERD, hypertension who presents with worsening of abdominal pain and nausea and vomiting. Pt was found to have acute cholecystitis. General surgery was consulted.  Assessment & Plan:   Principal Problem:   Acute cholecystitis Active Problems:   Migraine   Anxiety   Chronic GERD   Low back pain with sciatica   Essential hypertension   AKI (acute kidney injury) ([place])   Right upper quadrant abdominal pain Cholecystitis ?Choledocholithiasis > Patient presenting with right upper quadrant pain was previously seen for this 2 days prior to admit but pain has worsened and now with minimal wall thickening on ultrasound in addition to gallstones and sludge. > Positive Murphy sign noted on presenting exam  > General surgery consulted given concerns of acute cholecystitis. - Appreciate General Surgery input. Pt is now s/p lap chole on 3/26 -GI following, pt underwent ERCP 3/29 with findings of choledocholithiasis s/p sphincterotomy  -Clear liquids to begin later this afternoon per GI. Would advance diet as tolerated  AKI > In the setting of decreased p.o. intake due to abdominal pain as above.  Presenting creatinine was elevated 1.47 from baseline of around 1. - renal function had improved with hydration -repeat bmet in AM  Anxiety/depression - Continued on home prazosin, Xanax, hydroxyzine, nortriptyline  Migraines - Continue home regimen as needed  Hypertension - BP remains stable - currently not on hypertensive meds  Chronic pain  - Continue gabapentin and oxycodone per home regimen  GERD - Continue PPI  History of sleeve gastrectomy - appears stable currently  DVT prophylaxis: SCD's Code Status: Full Family Communication: Pt in room, family not at  bedside  Status is: Inpatient  Requires continued inpatient stay because:  Ongoing diagnostic testing needed not appropriate for outpatient work up and Inpatient level of care appropriate due to severity of illness  Dispo: The patient is from: Home              Anticipated d/c is to: Home              Patient currently is not medically stable to d/c.   Difficult to place patient No  Consultants:   General Surgery  GI  Procedures:   MRICP  Lap chole 3/26  Antimicrobials: Anti-infectives (From admission, onward)   Start     Dose/Rate Route Frequency Ordered Stop   02/05/21 1126  piperacillin-tazobactam (ZOSYN) 3.375 GM/50ML IVPB       Note to Pharmacy: Dara Lords   : cabinet override      02/05/21 1126 02/05/21 1131   02/05/21 0400  piperacillin-tazobactam (ZOSYN) IVPB 3.375 g        3.375 g 12.5 mL/hr over 240 Minutes Intravenous Every 8 hours 02/04/21 2258     02/05/21 0300  piperacillin-tazobactam (ZOSYN) IVPB 3.375 g  Status:  Discontinued        3.375 g 100 mL/hr over 30 Minutes Intravenous Every 6 hours 02/04/21 2244 02/04/21 2258   02/04/21 2015  piperacillin-tazobactam (ZOSYN) IVPB 3.375 g        3.375 g 100 mL/hr over 30 Minutes Intravenous  Once 02/04/21 2014 02/04/21 2053      Subjective: Seen prior to ERCP. Pt reports feeling very excited about upcoming procedure  Objective: Vitals:   02/08/21 1341 02/08/21 1351 02/08/21 1401  02/08/21 1411  BP: 126/65 129/81 (!) 143/71 139/83  Pulse: 85 89 90 91  Resp: 17 15 15 14   Temp: 98.8 F (37.1 C)     TempSrc: Axillary     SpO2: 99% 92% 93% 92%  Weight:      Height:        Intake/Output Summary (Last 24 hours) at 02/08/2021 1440 Last data filed at 02/08/2021 1307 Gross per 24 hour  Intake 972 ml  Output 0 ml  Net 972 ml   Filed Weights   02/04/21 1355 02/07/21 0533 02/08/21 0500  Weight: 111.1 kg 107.1 kg 112.8 kg    Examination: General exam: Awake, laying in bed, in nad Respiratory system:  Normal respiratory effort, no wheezing Cardiovascular system: regular rate, s1, s2 Gastrointestinal system: Soft, nondistended, positive BS Central nervous system: CN2-12 grossly intact, strength intact Extremities: Perfused, no clubbing Skin: Normal skin turgor, no notable skin lesions seen Psychiatry: Mood normal // no visual hallucinations   Data Reviewed: I have personally reviewed following labs and imaging studies  CBC: Recent Labs  Lab 02/02/21 1820 02/04/21 1411 02/05/21 0604 02/06/21 0336 02/07/21 0324  WBC 9.3 6.6 6.7 8.5 6.6  HGB 10.8* 10.8* 9.1* 9.0* 8.7*  HCT 34.9* 35.2* 30.0* 29.8* 28.7*  MCV 86.2 86.9 86.2 86.9 87.0  PLT 239 237 185 207 631   Basic Metabolic Panel: Recent Labs  Lab 02/05/21 0604 02/06/21 0336 02/06/21 0906 02/07/21 0324 02/08/21 0356  NA 135 138 137 140 138  K 3.6 4.4 4.4 4.2 4.8  CL 103 101 102 102 101  CO2 24 25 24 29 29   GLUCOSE 107* 156* 134* 125* 102*  BUN 17 10 10 13 14   CREATININE 1.10* 0.90 0.75 1.04* 1.11*  CALCIUM 8.4* 9.4 9.3 9.3 8.8*   GFR: Estimated Creatinine Clearance: 72.1 mL/min (A) (by C-G formula based on SCr of 1.11 mg/dL (H)). Liver Function Tests: Recent Labs  Lab 02/05/21 0604 02/06/21 0336 02/06/21 0906 02/07/21 0324 02/08/21 0356  AST 62* 86* 76* 68* 46*  ALT 221* 207* 185* 168* 133*  ALKPHOS 218* 222* 203* 233* 266*  BILITOT 2.9* 3.2* 3.0* 2.2* 1.5*  PROT 6.4* 7.3 6.8 6.6 6.5  ALBUMIN 3.0* 3.5 3.4* 3.0* 3.0*   Recent Labs  Lab 02/02/21 1820 02/04/21 1411  LIPASE 24 19   No results for input(s): AMMONIA in the last 168 hours. Coagulation Profile: No results for input(s): INR, PROTIME in the last 168 hours. Cardiac Enzymes: No results for input(s): CKTOTAL, CKMB, CKMBINDEX, TROPONINI in the last 168 hours. BNP (last 3 results) No results for input(s): PROBNP in the last 8760 hours. HbA1C: No results for input(s): HGBA1C in the last 72 hours. CBG: No results for input(s): GLUCAP in the  last 168 hours. Lipid Profile: No results for input(s): CHOL, HDL, LDLCALC, TRIG, CHOLHDL, LDLDIRECT in the last 72 hours. Thyroid Function Tests: No results for input(s): TSH, T4TOTAL, FREET4, T3FREE, THYROIDAB in the last 72 hours. Anemia Panel: No results for input(s): VITAMINB12, FOLATE, FERRITIN, TIBC, IRON, RETICCTPCT in the last 72 hours. Sepsis Labs: No results for input(s): PROCALCITON, LATICACIDVEN in the last 168 hours.  Recent Results (from the past 240 hour(s))  Resp Panel by RT-PCR (Flu A&B, Covid) Nasopharyngeal Swab     Status: None   Collection Time: 02/04/21  6:43 PM   Specimen: Nasopharyngeal Swab; Nasopharyngeal(NP) swabs in vial transport medium  Result Value Ref Range Status   SARS Coronavirus 2 by RT PCR NEGATIVE NEGATIVE Final  Comment: (NOTE) SARS-CoV-2 target nucleic acids are NOT DETECTED.  The SARS-CoV-2 RNA is generally detectable in upper respiratory specimens during the acute phase of infection. The lowest concentration of SARS-CoV-2 viral copies this assay can detect is 138 copies/mL. A negative result does not preclude SARS-Cov-2 infection and should not be used as the sole basis for treatment or other patient management decisions. A negative result may occur with  improper specimen collection/handling, submission of specimen other than nasopharyngeal swab, presence of viral mutation(s) within the areas targeted by this assay, and inadequate number of viral copies(<138 copies/mL). A negative result must be combined with clinical observations, patient history, and epidemiological information. The expected result is Negative.  Fact Sheet for Patients:  EntrepreneurPulse.com.au  Fact Sheet for Healthcare Providers:  IncredibleEmployment.be  This test is no t yet approved or cleared by the [place] FDA and  has been authorized for detection and/or diagnosis of SARS-CoV-2 by FDA under an Emergency Use  Authorization (EUA). This EUA will remain  in effect (meaning this test can be used) for the duration of the COVID-19 declaration under Section 564(b)(1) of the Act, 21 U.S.C.section 360bbb-3(b)(1), unless the authorization is terminated  or revoked sooner.       Influenza A by PCR NEGATIVE NEGATIVE Final   Influenza B by PCR NEGATIVE NEGATIVE Final    Comment: (NOTE) The Xpert Xpress SARS-CoV-2/FLU/RSV plus assay is intended as an aid in the diagnosis of influenza from Nasopharyngeal swab specimens and should not be used as a sole basis for treatment. Nasal washings and aspirates are unacceptable for Xpert Xpress SARS-CoV-2/FLU/RSV testing.  Fact Sheet for Patients: EntrepreneurPulse.com.au  Fact Sheet for Healthcare Providers: IncredibleEmployment.be  This test is not yet approved or cleared by the [place] FDA and has been authorized for detection and/or diagnosis of SARS-CoV-2 by FDA under an Emergency Use Authorization (EUA). This EUA will remain in effect (meaning this test can be used) for the duration of the COVID-19 declaration under Section 564(b)(1) of the Act, 21 U.S.C. section 360bbb-3(b)(1), unless the authorization is terminated or revoked.  Performed at [facility], [place] [address], [place], [place] [address]      Radiology Studies: DG ERCP BILIARY & PANCREATIC DUCTS  Result Date: 02/08/2021 CLINICAL DATA:  55 year old female with cholelithiasis EXAM: ERCP TECHNIQUE: Multiple spot images obtained with the fluoroscopic device and submitted for interpretation post-procedure. FLUOROSCOPY TIME:  Fluoroscopy Time:  1 minutes 24 seconds COMPARISON:  MR 02/04/2021, intraoperative cholangiogram 02/05/2021 FINDINGS: Limited intraoperative fluoroscopic spot images during ERCP. Initial image demonstrates the endoscope projecting over the upper abdomen. Surgical changes of cholecystectomy. Subsequently there is  cannulation of the ampulla and retrograde infusion of contrast. Deployment of a retrieval balloon. IMPRESSION: Limited images during ERCP demonstrates deployment of a retrieval balloon for treatment of choledocholithiasis. Please refer to the dictated operative report for full details of intraoperative findings and procedure. Electronically Signed   By: Corrie Mckusick D.O.   On: 02/08/2021 13:42    Scheduled Meds: . gabapentin  600 mg Oral TID  . hydrOXYzine  25 mg Oral QHS  . indomethacin  100 mg Rectal Once  . nortriptyline  100 mg Oral QHS  . OXcarbazepine  150 mg Oral Daily  . Oxcarbazepine  300 mg Oral QHS  . pantoprazole  40 mg Oral BID  . prazosin  5 mg Oral QHS   Continuous Infusions: . piperacillin-tazobactam (ZOSYN)  IV 3.375 g (02/08/21 0811)     LOS: 2 days  Marylu Lund, MD [facility] Pager On Amion  If 7PM-7AM, please contact night-coverage 02/08/2021, 2:40 PM

## 2021-02-08 NOTE — Transfer of Care (Signed)
Immediate Anesthesia Transfer of Care Note  Patient: Gwendolyn Russell  Procedure(s) Performed: ENDOSCOPIC RETROGRADE CHOLANGIOPANCREATOGRAPHY (ERCP) (N/A ) REMOVAL OF STONES SPHINCTEROTOMY  Patient Location: Endoscopy Unit  Anesthesia Type:General  Level of Consciousness: awake, alert , oriented and patient cooperative  Airway & Oxygen Therapy: Patient Spontanous Breathing and Patient connected to face mask  Post-op Assessment: Report given to RN and Post -op Vital signs reviewed and stable  Post vital signs: Reviewed and stable  Last Vitals:  Vitals Value Taken Time  BP 129/81 02/08/21 1351  Temp 37.1 C 02/08/21 1341  Pulse 89 02/08/21 1356  Resp 15 02/08/21 1356  SpO2 92 % 02/08/21 1356  Vitals shown include unvalidated device data.  Last Pain:  Vitals:   02/08/21 1351  TempSrc:   PainSc: 0-No pain      Patients Stated Pain Goal: 2 (49/67/59 1638)  Complications: No complications documented.

## 2021-02-08 NOTE — Plan of Care (Signed)
  Problem: Health Behavior/Discharge Planning: Goal: Ability to manage health-related needs will improve Outcome: Progressing   Problem: Clinical Measurements: Goal: Ability to maintain clinical measurements within normal limits will improve Outcome: Progressing Goal: Will remain free from infection Outcome: Progressing   Problem: Nutrition: Goal: Adequate nutrition will be maintained Outcome: Progressing   Problem: Coping: Goal: Level of anxiety will decrease Outcome: Progressing   Problem: Pain Managment: Goal: General experience of comfort will improve Outcome: Progressing

## 2021-02-09 ENCOUNTER — Encounter (HOSPITAL_COMMUNITY): Payer: Self-pay | Admitting: Gastroenterology

## 2021-02-09 DIAGNOSIS — M5440 Lumbago with sciatica, unspecified side: Secondary | ICD-10-CM

## 2021-02-09 DIAGNOSIS — K8050 Calculus of bile duct without cholangitis or cholecystitis without obstruction: Secondary | ICD-10-CM

## 2021-02-09 DIAGNOSIS — G8929 Other chronic pain: Secondary | ICD-10-CM

## 2021-02-09 DIAGNOSIS — Z9189 Other specified personal risk factors, not elsewhere classified: Secondary | ICD-10-CM

## 2021-02-09 LAB — COMPREHENSIVE METABOLIC PANEL
ALT: 111 U/L — ABNORMAL HIGH (ref 0–44)
AST: 35 U/L (ref 15–41)
Albumin: 3 g/dL — ABNORMAL LOW (ref 3.5–5.0)
Alkaline Phosphatase: 256 U/L — ABNORMAL HIGH (ref 38–126)
Anion gap: 9 (ref 5–15)
BUN: 11 mg/dL (ref 6–20)
CO2: 25 mmol/L (ref 22–32)
Calcium: 9 mg/dL (ref 8.9–10.3)
Chloride: 105 mmol/L (ref 98–111)
Creatinine, Ser: 1 mg/dL (ref 0.44–1.00)
GFR, Estimated: >60 mL/min (ref >60)
Glucose, Bld: 127 mg/dL — ABNORMAL HIGH (ref 70–99)
Potassium: 4.6 mmol/L (ref 3.5–5.1)
Sodium: 139 mmol/L (ref 135–145)
Total Bilirubin: 1.3 mg/dL — ABNORMAL HIGH (ref 0.3–1.2)
Total Protein: 6.5 g/dL (ref 6.5–8.1)

## 2021-02-09 NOTE — Anesthesia Postprocedure Evaluation (Signed)
Anesthesia Post Note  Patient: Madeliene W Depaola  Procedure(s) Performed: ENDOSCOPIC RETROGRADE CHOLANGIOPANCREATOGRAPHY (ERCP) (N/A ) REMOVAL OF STONES SPHINCTEROTOMY     Patient location during evaluation: Endoscopy Anesthesia Type: General Level of consciousness: awake Pain management: pain level controlled Vital Signs Assessment: post-procedure vital signs reviewed and stable Respiratory status: spontaneous breathing, nonlabored ventilation, respiratory function stable and patient connected to nasal cannula oxygen Cardiovascular status: blood pressure returned to baseline and stable Postop Assessment: no apparent nausea or vomiting Anesthetic complications: no   No complications documented.  Last Vitals:  Vitals:   02/08/21 2052 02/09/21 0643  BP: 140/86 134/65  Pulse: 95 84  Resp: 18 18  Temp: 36.5 C 36.7 C  SpO2: 99% 98%    Last Pain:  Vitals:   02/09/21 0200  TempSrc:   PainSc: Asleep                 Tamara Monteith P Kynzlee Hucker

## 2021-02-09 NOTE — Discharge Summary (Signed)
Physician Discharge Summary  Gwendolyn Russell:[id number] DOB: March 04, 1966 DOA: 02/04/2021  PCP: Sofie Rower, FNP  Admit date: 02/04/2021 Discharge date: 02/09/2021  Admitted From: Home Disposition: Home  Recommendations for Outpatient Follow-up:  1. Follow ups as below. 2. Please obtain CBC/BMP/Mag at follow up 3. Please follow up on the following pending results: None  Home Health: None required Equipment/Devices: None required  Discharge Condition: Stable CODE STATUS: Full code   Follow-up [facility] Go on 02/24/2021.   Specialty: General Surgery Why: Your appointment is 4/14 at 3pm Please arrive 30 minutes prior to your appointment to check in and fill out paperwork. Bring photo ID and insurance information. Contact information: [address] [place], [place], [place]. Schedule an appointment as soon as possible for a visit in 1 week(s).   Specialty: Internal Medicine Contact information: [place] [address] [place] [address] [place] [facility] Course: 55 y.o.femalewith medical history significant ofmigraines, obesity, anxiety, DVT, GERD, hypertension who presents with worsening of abdominal pain and nausea and vomiting, and admitted for acute cholecystitis.  She underwent laparoscopic cholecystectomy on ERCP with removal of choledocholithiasis and sphincterectomy.  Eventually, she tolerated soft diet and cleared for discharge for outpatient follow-up.  She was treated with IV ceftriaxone and Flagyl perioperatively.  See individual problem list below for more on hospital course.  Assessment and plan Right upper quadrant abdominal pain Cholecystitis s/p laparoscopic cholecystectomy on 3/26 Choledocholithiasis s/p ERCP removal and sphincterectomy -Received IV Flagyl and ceftriaxone perioperatively until the day of  discharge. -Tolerated soft diet and cleared for discharge by general surgery.  AKI: Likely from decreased p.o. intake in the setting of the above. Recent Labs    02/02/21 1820 02/04/21 1411 02/05/21 0604 02/06/21 0336 02/06/21 0906 02/07/21 0324 02/08/21 0356 02/09/21 0408  BUN 28* 25* 17 10 10 13 14 11   CREATININE 1.12* 1.47* 1.10* 0.90 0.75 1.04* 1.11* 1.00  -Recheck renal function at follow-up.  Anxiety/depression: Stable -Continued on home prazosin, Xanax, hydroxyzine, nortriptyline  Migraines: Stable -Continue home regimen as needed  Hypertension?  Not on antihypertensive meds.  BP within acceptable range.  -Outpatient follow-up with PCP  Chronic pain -Continue home gabapentin and oxycodone  GERD/gastritis -Continue home medications  History of sleeve gastrectomy - appears stable currently  At risk for polypharmacy: on multiple sedating medications. -Strongly recommend reviewing her medications   Discharge Diagnoses:     Body mass index is 40.14 kg/m.            Discharge Exam: Vitals:   02/08/21 2052 02/09/21 0643  BP: 140/86 134/65  Pulse: 95 84  Resp: 18 18  Temp: 97.7 F (36.5 C) 98 F (36.7 C)  SpO2: 99% 98%    GENERAL: No apparent distress.  Nontoxic. HEENT: MMM.  Vision and hearing grossly intact.  RESP: On RA.  No IWOB.  CVS:  RRR. Heart sounds normal.  MSK/EXT: No apparent deformity. NEURO: Awake, alert and oriented appropriately.  No apparent focal neuro deficit. PSYCH: Calm. Normal affect.   Off note patient had a visitor (staff) and asked me to return when I went to evaluate her. When I returned to see her, she has already left the hospital. Physical exam is based on brief eyeball observation.  Discharge Instructions  Discharge Instructions  Call MD for:  difficulty breathing, headache or visual disturbances   Complete by: As directed    Call MD for:  extreme fatigue   Complete by: As directed    Call MD  for:  persistant dizziness or light-headedness   Complete by: As directed    Call MD for:  persistant nausea and vomiting   Complete by: As directed    Call MD for:  severe uncontrolled pain   Complete by: As directed    Call MD for:  temperature >100.4   Complete by: As directed    Diet general   Complete by: As directed    Soft bland diet for the next 2 to 3 days   Discharge instructions   Complete by: As directed    It has been a pleasure taking care of you!  You were hospitalized with gallbladder infection.  You were treated surgically and medically with improvement in your symptoms to the point we think it is safe to let you go home and follow-up with your doctors.  Please review your new medication list and the directions on your medications before you take them.  You may continue soft bland diet for the next 2 to 3 days.    Take care,   Increase activity slowly   Complete by: As directed    No wound care   Complete by: As directed      Allergies as of 02/09/2021      Reactions   Nsaids    Other reaction(s): Other Due to Gastric Bypass   Tolmetin    Other reaction(s): Other (See Comments) Due to Gastric Bypass   Zonisamide Rash   Cephalosporins Hives   Ciprofloxacin    Clindamycin Itching   Tdap [tetanus-diphth-acell Pertussis]    Latex Rash   Sulfa Antibiotics Rash   Tape Rash   Adhesive tape      Medication List    STOP taking these medications   oxyCODONE-acetaminophen 5-325 MG tablet Commonly known as: PERCOCET/ROXICET     TAKE these medications   acetaminophen 500 MG tablet Commonly known as: TYLENOL Take 1,000 mg by mouth every 6 (six) hours as needed for moderate pain.   albuterol 108 (90 Base) MCG/ACT inhaler Commonly known as: VENTOLIN HFA Inhale 1-2 puffs into the lungs every 6 (six) hours as needed for wheezing or shortness of breath.   ALPRAZolam 1 MG tablet Commonly known as: XANAX Take 1 mg by mouth 3 (three) times daily as needed.    aspirin EC 81 MG tablet Take 81 mg by mouth daily. Swallow whole.   betamethasone valerate ointment 0.1 % Commonly known as: VALISONE Apply 1 application topically 2 (two) times daily.   Biotin 10000 MCG Tabs Take 10,000 Units by mouth daily.   diclofenac Sodium 1 % Gel Commonly known as: VOLTAREN Apply 2 g topically daily as needed (pain).   dicyclomine 20 MG tablet Commonly known as: BENTYL Take 20 mg by mouth 3 (three) times daily before meals.   famotidine 40 MG tablet Commonly known as: PEPCID Take 40 mg by mouth at bedtime.   fluocinonide ointment 0.05 % Commonly known as: LIDEX Apply 1 application topically daily as needed.   gabapentin 300 MG capsule Commonly known as: NEURONTIN Take 600 mg by mouth 3 (three) times daily.   hydrOXYzine 25 MG capsule Commonly known as: VISTARIL Take 25 mg by mouth at bedtime.   levocetirizine 5 MG tablet Commonly known as: XYZAL Take 5 mg by mouth  daily as needed for allergies.   linaclotide 290 MCG Caps capsule Commonly known as: LINZESS Take 290 mcg by mouth daily before breakfast.   magnesium gluconate 500 MG tablet Commonly known as: MAGONATE Take 500 mg by mouth daily.   nortriptyline 50 MG capsule Commonly known as: PAMELOR Take 100 mg by mouth at bedtime.   ondansetron 8 MG disintegrating tablet Commonly known as: Zofran ODT Take 1 tablet (8 mg total) by mouth every 8 (eight) hours as needed for nausea or vomiting.   Oxcarbazepine 300 MG tablet Commonly known as: TRILEPTAL Take 150-300 mg by mouth See admin instructions. Takes 1/2 tablet in the morning and 1 tablet at night   oxyCODONE 5 MG immediate release tablet Commonly known as: Oxy IR/ROXICODONE Take 1 tablet (5 mg total) by mouth every 6 (six) hours as needed for severe pain.   pantoprazole 40 MG tablet Commonly known as: PROTONIX Take 40 mg by mouth 2 (two) times daily.   prazosin 5 MG capsule Commonly known as: MINIPRESS Take 5 mg by mouth  at bedtime.   prenatal multivitamin Tabs tablet Take 1 tablet by mouth daily at 12 noon.   PROBIOTIC ACIDOPHILUS PO Take 1 tablet by mouth daily.   PROBIOTIC DAILY PO Take 1 tablet by mouth daily.   sucralfate 1 g tablet Commonly known as: CARAFATE Take 1 g by mouth daily as needed (stomach pain).   traMADol 50 MG tablet Commonly known as: ULTRAM Take 50 mg by mouth every 6 (six) hours as needed for moderate pain.   VITAMIN B 12 PO Take 1 capsule by mouth daily.       Consultations:  Gastroenterology  General surgery  Procedures/Studies:  01/08/2021-laparoscopic cholecystectomy  11/09/2021-ERCP with a sphincterectomy   DG Cholangiogram Operative  Result Date: 02/05/2021 CLINICAL DATA:  Cholelithiasis EXAM: INTRAOPERATIVE CHOLANGIOGRAM TECHNIQUE: Cholangiographic images from the C-arm fluoroscopic device were submitted for interpretation post-operatively. Please see the procedural report for the amount of contrast and the fluoroscopy time utilized. COMPARISON:  MRCP 02/04/2021 FLUOROSCOPY TIME:  0 minutes 25 seconds Dose: 25.70 mGy FINDINGS: Two intraoperative imaging series. Common hepatic duct and common bile duct appear normal in caliber. Visualized intrahepatic ducts normal appearance. No filling defects or stricture identified. Spillage of contrast into the duodenum is not demonstrated on the submitted series. Tapering at distal CBD. IMPRESSION: No evidence of choledocholithiasis or CBD stricture on submitted series. No spillage of contrast into the duodenum is present on the submitted series to confirm distal CBD patency. Electronically Signed   By: Lavonia Dana M.D.   On: 02/05/2021 12:25   US Abdomen Complete  Result Date: 02/02/2021 CLINICAL DATA:  Abdominal pain, elevated LFTs EXAM: ABDOMEN ULTRASOUND COMPLETE COMPARISON:  11/28/2014 FINDINGS: Gallbladder: Numerous stones throughout the gallbladder measuring up to 1 cm. Sludge within the gallbladder. Small amount of  pericholecystic fluid. No wall thickening or sonographic Murphy sign. Common bile duct: Diameter: Upper limits normal in caliber, 6 mm. Liver: Increased echotexture compatible with fatty infiltration. No focal abnormality or biliary ductal dilatation. Portal vein is patent on color Doppler imaging with normal direction of blood flow towards the liver. IVC: No abnormality visualized. Pancreas: Visualized portion unremarkable. Spleen: Size and appearance within normal limits. Right Kidney: Length: 10.5 cm. Echogenicity within normal limits. No mass or hydronephrosis visualized. Left Kidney: Length: 9.8 cm. Echogenicity within normal limits. No mass or hydronephrosis visualized. Abdominal aorta: No aneurysm visualized. Other findings: None. IMPRESSION: Stones and sludge within the gallbladder. Small amount of pericholecystic  fluid without wall thickening or sonographic Murphy sign. Recommend clinical correlation to exclude early acute cholecystitis. Hepatic steatosis. Electronically Signed   By: Rolm Baptise M.D.   On: 02/02/2021 21:09   MR 3D Recon At Scanner  Result Date: 02/04/2021 CLINICAL DATA:  Right upper quadrant abdominal pain, ultrasound nondiagnostic. EXAM: MRI ABDOMEN WITHOUT AND WITH CONTRAST (INCLUDING MRCP) TECHNIQUE: Multiplanar multisequence MR imaging of the abdomen was performed both before and after the administration of intravenous contrast. Heavily T2-weighted images of the biliary and pancreatic ducts were obtained, and three-dimensional MRCP images were rendered by post processing. CONTRAST:  18mL GADAVIST GADOBUTROL 1 MMOL/ML IV SOLN COMPARISON:  Multiple priors including same-day ultrasound, CT abdomen pelvis December 15, 2014, right upper quadrant ultrasound November 28, 2014 and HIDA scan January 01, 2015. FINDINGS: Lower chest: No acute abnormality. Hepatobiliary: Mild diffuse hepatic steatosis. No suspicious hepatic lesion. Numerous cholelithiasis and biliary sludge are again visualized  within the lumen of a mildly distended gallbladder measuring up to 3.2 cm. There is mild wall thickening measuring up to 4 mm, slightly increased wall thickness in comparison to studies from 2016 when it measured upper limits of normal at 3 mm. There is trace pericholecystic fluid. No biliary ductal dilation with the common duct measuring 5.6 mm. There is layering low-density debris in the common bile duct without visualized choledocholithiasis. Pancreas: No mass, inflammatory changes, or other parenchymal abnormality identified. Spleen:  Within normal limits in size and appearance. Adrenals/Urinary Tract: No masses identified. No evidence of hydronephrosis. 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 No evidence of obstruction. Colonic diverticulosis. Vascular/Lymphatic: No pathologically enlarged lymph nodes identified. No abdominal aortic aneurysm demonstrated. Other:  None. Musculoskeletal: No suspicious bone lesions identified. IMPRESSION: 1. Numerous cholelithiasis and biliary sludge are again visualized within the lumen of a mildly distended gallbladder measuring up to 3.2 cm, with mild wall thickening measuring up to 4 mm. Slightly increased in comparison to studies from 2016 when it measured upper limits of normal at 3 mm. There is trace pericholecystic fluid. These findings are equivocal for acute cholecystitis. Suggest further evaluation with nuclear medicine HIDA scan to assess for cystic duct patency. 2. There is layering low-density debris in the common bile duct, likely reflecting biliary sludge without biliary ductal dilation or visualized choledocholithiasis. 3. Mild diffuse hepatic steatosis. 4. 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 Electronically  Signed   By: Dahlia Bailiff MD   On: 02/04/2021 22:51   DG Chest Portable 1 View  Result Date: 02/02/2021 CLINICAL DATA:  Abdominal pain, chest pain EXAM: PORTABLE CHEST 1 VIEW COMPARISON:  01/14/2021 FINDINGS: Heart and mediastinal contours are within normal limits. No focal opacities or effusions. No acute bony abnormality. IMPRESSION: Negative. Electronically Signed   By: Rolm Baptise M.D.   On: 02/02/2021 19:54   DG ERCP BILIARY & PANCREATIC DUCTS  Result Date: 02/08/2021 CLINICAL DATA:  55 year old female with cholelithiasis EXAM: ERCP TECHNIQUE: Multiple spot images obtained with the fluoroscopic device and submitted for interpretation post-procedure. FLUOROSCOPY TIME:  Fluoroscopy Time:  1 minutes 24 seconds COMPARISON:  MR 02/04/2021, intraoperative cholangiogram 02/05/2021 FINDINGS: Limited intraoperative fluoroscopic spot images during ERCP. Initial image demonstrates the endoscope projecting over the upper abdomen. Surgical changes of cholecystectomy. Subsequently there is cannulation of the ampulla  and retrograde infusion of contrast. Deployment of a retrieval balloon. IMPRESSION: Limited images during ERCP demonstrates deployment of a retrieval balloon for treatment of choledocholithiasis. Please refer to the dictated operative report for full details of intraoperative findings and procedure. Electronically Signed   By: Corrie Mckusick D.O.   On: 02/08/2021 13:42   MR ABDOMEN MRCP W WO CONTAST  Result Date: 02/04/2021 CLINICAL DATA:  Right upper quadrant abdominal pain, ultrasound nondiagnostic. EXAM: MRI ABDOMEN WITHOUT AND WITH CONTRAST (INCLUDING MRCP) TECHNIQUE: Multiplanar multisequence MR imaging of the abdomen was performed both before and after the administration of intravenous contrast. Heavily T2-weighted images of the biliary and pancreatic ducts were obtained, and three-dimensional MRCP images were rendered by post processing. CONTRAST:  34mL GADAVIST GADOBUTROL 1 MMOL/ML IV SOLN  COMPARISON:  Multiple priors including same-day ultrasound, CT abdomen pelvis December 15, 2014, right upper quadrant ultrasound November 28, 2014 and HIDA scan January 01, 2015. FINDINGS: Lower chest: No acute abnormality. Hepatobiliary: Mild diffuse hepatic steatosis. No suspicious hepatic lesion. Numerous cholelithiasis and biliary sludge are again visualized within the lumen of a mildly distended gallbladder measuring up to 3.2 cm. There is mild wall thickening measuring up to 4 mm, slightly increased wall thickness in comparison to studies from 2016 when it measured upper limits of normal at 3 mm. There is trace pericholecystic fluid. No biliary ductal dilation with the common duct measuring 5.6 mm. There is layering low-density debris in the common bile duct without visualized choledocholithiasis. Pancreas: No mass, inflammatory changes, or other parenchymal abnormality identified. Spleen:  Within normal limits in size and appearance. Adrenals/Urinary Tract: No masses identified. No evidence of hydronephrosis. 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 No evidence of obstruction. Colonic diverticulosis. Vascular/Lymphatic: No pathologically enlarged lymph nodes identified. No abdominal aortic aneurysm demonstrated. Other:  None. Musculoskeletal: No suspicious bone lesions identified. IMPRESSION: 1. Numerous cholelithiasis and biliary sludge are again visualized within the lumen of a mildly distended gallbladder measuring up to 3.2 cm, with mild wall thickening measuring up to 4 mm. Slightly increased in comparison to studies from 2016 when it measured upper limits of normal at 3 mm. There is trace pericholecystic fluid. These findings are equivocal for acute cholecystitis. Suggest further evaluation with nuclear medicine HIDA scan to assess for cystic duct patency. 2. There is layering  low-density debris in the common bile duct, likely reflecting biliary sludge without biliary ductal dilation or visualized choledocholithiasis. 3. Mild diffuse hepatic steatosis. 4. 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 Electronically Signed   By: Dahlia Bailiff MD   On: 02/04/2021 22:51   US Abdomen Limited RUQ (LIVER/GB)  Result Date: 02/04/2021 CLINICAL DATA:  RIGHT upper quadrant pain EXAM: ULTRASOUND ABDOMEN LIMITED RIGHT UPPER QUADRANT COMPARISON:  02/02/2021 FINDINGS: Gallbladder: Multiple shadowing calculi within gallbladder up to 13 mm diameter. Small amount of accompanying gallbladder sludge. Gallbladder wall minimally thickened 4 mm diameter, slightly increased from previous exam. No sonographic Murphy sign or pericholecystic fluid Common bile duct: Diameter: 6 mm, upper normal, unchanged Liver: Minimally increased echogenicity with smooth margins question fatty infiltration. No mass or nodularity. No intrahepatic biliary dilatation. Portal vein is patent on color Doppler imaging with normal direction of blood flow towards the liver. Other: No RIGHT upper quadrant free fluid. IMPRESSION: Multiple  calculi and sludge within the gallbladder. Minimal gallbladder wall thickening, new versus previous exam though no sonographic Murphy sign or pericholecystic fluid are identified. Electronically Signed   By: Lavonia Dana M.D.   On: 02/04/2021 19:22        The results of significant diagnostics from this hospitalization (including imaging, microbiology, ancillary and laboratory) are listed below for reference.     Microbiology: Recent Results (from the past 240 hour(s))  Resp Panel by RT-PCR (Flu A&B, Covid) Nasopharyngeal Swab     Status: None   Collection Time: 02/04/21  6:43 PM   Specimen: Nasopharyngeal Swab; Nasopharyngeal(NP) swabs in vial transport medium   Result Value Ref Range Status   SARS Coronavirus 2 by RT PCR NEGATIVE NEGATIVE Final    Comment: (NOTE) SARS-CoV-2 target nucleic acids are NOT DETECTED.  The SARS-CoV-2 RNA is generally detectable in upper respiratory specimens during the acute phase of infection. The lowest concentration of SARS-CoV-2 viral copies this assay can detect is 138 copies/mL. A negative result does not preclude SARS-Cov-2 infection and should not be used as the sole basis for treatment or other patient management decisions. A negative result may occur with  improper specimen collection/handling, submission of specimen other than nasopharyngeal swab, presence of viral mutation(s) within the areas targeted by this assay, and inadequate number of viral copies(<138 copies/mL). A negative result must be combined with clinical observations, patient history, and epidemiological information. The expected result is Negative.  Fact Sheet for Patients:  EntrepreneurPulse.com.au  Fact Sheet for Healthcare Providers:  IncredibleEmployment.be  This test is no t yet approved or cleared by the [place] FDA and  has been authorized for detection and/or diagnosis of SARS-CoV-2 by FDA under an Emergency Use Authorization (EUA). This EUA will remain  in effect (meaning this test can be used) for the duration of the COVID-19 declaration under Section 564(b)(1) of the Act, 21 U.S.C.section 360bbb-3(b)(1), unless the authorization is terminated  or revoked sooner.       Influenza A by PCR NEGATIVE NEGATIVE Final   Influenza B by PCR NEGATIVE NEGATIVE Final    Comment: (NOTE) The Xpert Xpress SARS-CoV-2/FLU/RSV plus assay is intended as an aid in the diagnosis of influenza from Nasopharyngeal swab specimens and should not be used as a sole basis for treatment. Nasal washings and aspirates are unacceptable for Xpert Xpress SARS-CoV-2/FLU/RSV testing.  Fact Sheet for  Patients: EntrepreneurPulse.com.au  Fact Sheet for Healthcare Providers: IncredibleEmployment.be  This test is not yet approved or cleared by the [place] FDA and has been authorized for detection and/or diagnosis of SARS-CoV-2 by FDA under an Emergency Use Authorization (EUA). This EUA will remain in effect (meaning this test can be used) for the duration of the COVID-19 declaration under Section 564(b)(1) of the Act, 21 U.S.C. section 360bbb-3(b)(1), unless the authorization is terminated or revoked.  Performed at [facility], [place] [address], [place],  [address]      Labs:  CBC: Recent Labs  Lab 02/04/21 1411 02/05/21 0604 02/06/21 0336 02/07/21 0324  WBC 6.6 6.7 8.5 6.6  HGB 10.8* 9.1* 9.0* 8.7*  HCT 35.2* 30.0* 29.8* 28.7*  MCV 86.9 86.2 86.9 87.0  PLT 237 185 207 234   BMP &GFR Recent Labs  Lab 02/06/21 0336 02/06/21 0906 02/07/21 0324 02/08/21 0356 02/09/21 0408  NA 138 137 140 138 139  K 4.4 4.4 4.2 4.8 4.6  CL 101 102 102 101 105  CO2 25 24 29 29 25   GLUCOSE  156* 134* 125* 102* 127*  BUN 10 10 13 14 11   CREATININE 0.90 0.75 1.04* 1.11* 1.00  CALCIUM 9.4 9.3 9.3 8.8* 9.0   Estimated Creatinine Clearance: 80 mL/min (by C-G formula based on SCr of 1 mg/dL). Liver & Pancreas: Recent Labs  Lab 02/06/21 0336 02/06/21 0906 02/07/21 0324 02/08/21 0356 02/09/21 0408  AST 86* 76* 68* 46* 35  ALT 207* 185* 168* 133* 111*  ALKPHOS 222* 203* 233* 266* 256*  BILITOT 3.2* 3.0* 2.2* 1.5* 1.3*  PROT 7.3 6.8 6.6 6.5 6.5  ALBUMIN 3.5 3.4* 3.0* 3.0* 3.0*   Recent Labs  Lab 02/04/21 1411  LIPASE 19   No results for input(s): AMMONIA in the last 168 hours. Diabetic: No results for input(s): HGBA1C in the last 72 hours. No results for input(s): GLUCAP in the last 168 hours. Cardiac Enzymes: No results for input(s): CKTOTAL, CKMB, CKMBINDEX, TROPONINI in the last 168 hours. No results for  input(s): PROBNP in the last 8760 hours. Coagulation Profile: No results for input(s): INR, PROTIME in the last 168 hours. Thyroid Function Tests: No results for input(s): TSH, T4TOTAL, FREET4, T3FREE, THYROIDAB in the last 72 hours. Lipid Profile: No results for input(s): CHOL, HDL, LDLCALC, TRIG, CHOLHDL, LDLDIRECT in the last 72 hours. Anemia Panel: No results for input(s): VITAMINB12, FOLATE, FERRITIN, TIBC, IRON, RETICCTPCT in the last 72 hours. Urine analysis:    Component Value Date/Time   COLORURINE AMBER (A) 02/04/2021 1411   APPEARANCEUR CLEAR 02/04/2021 1411   LABSPEC 1.019 02/04/2021 1411   PHURINE 5.0 02/04/2021 1411   GLUCOSEU NEGATIVE 02/04/2021 1411   HGBUR NEGATIVE 02/04/2021 1411   BILIRUBINUR SMALL (A) 02/04/2021 1411   KETONESUR NEGATIVE 02/04/2021 1411   PROTEINUR NEGATIVE 02/04/2021 1411   UROBILINOGEN 0.2 02/07/2017 1443   NITRITE NEGATIVE 02/04/2021 1411   LEUKOCYTESUR NEGATIVE 02/04/2021 1411   Sepsis Labs: Invalid input(s): PROCALCITONIN, LACTICIDVEN   Time coordinating discharge: 40 minutes  SIGNED:  Mercy Riding, MD  [facility] 02/09/2021, 7:18 PM  If 7PM-7AM, please contact night-coverage [email or web address]

## 2021-02-09 NOTE — Progress Notes (Signed)
Patient will be discharging home with husband later today. Education on medications will be provided.

## 2021-02-09 NOTE — Progress Notes (Signed)
Leadership rounded on patient and husband today and yesterday regarding numerous care concerns.  Both upset 02/08/21 about varying plan of care communications from different MD and voiced concern with NT staffing and communication over the weekend.  02/09/2021 staff notified leadership when arrived to unit that patient and husband would like to speak with leadership regarding last night RN care.  Leadership rounded, husband and pt voiced concerns about night shift RN Shanon Brow regarding the assistance he provided her over the night, issues with tele monitor and placing her on high fall risk precautions.  I acknowledged patient and husbands concerns along with explained actions that the RN was initiating due to policy and procedure.  Service recovery provided.  Pt thanked leadership for support. Leadership will notify patient experience of concerns, as may require additional follow up.

## 2021-02-09 NOTE — Plan of Care (Signed)
  Problem: Health Behavior/Discharge Planning: Goal: Ability to manage health-related needs will improve Outcome: Progressing   

## 2022-09-10 ENCOUNTER — Other Ambulatory Visit: Payer: Self-pay

## 2022-09-10 ENCOUNTER — Emergency Department (HOSPITAL_BASED_OUTPATIENT_CLINIC_OR_DEPARTMENT_OTHER)
Admission: EM | Admit: 2022-09-10 | Discharge: 2022-09-10 | Disposition: A | Discharge status: Home / Self Care | Payer: BC Managed Care – PPO | Attending: Emergency Medicine | Admitting: Emergency Medicine

## 2022-09-10 ENCOUNTER — Encounter (HOSPITAL_BASED_OUTPATIENT_CLINIC_OR_DEPARTMENT_OTHER): Payer: Self-pay | Admitting: Emergency Medicine

## 2022-09-10 DIAGNOSIS — Z79899 Other long term (current) drug therapy: Secondary | ICD-10-CM | POA: Diagnosis not present

## 2022-09-10 DIAGNOSIS — I10 Essential (primary) hypertension: Secondary | ICD-10-CM | POA: Insufficient documentation

## 2022-09-10 DIAGNOSIS — M79605 Pain in left leg: Secondary | ICD-10-CM | POA: Diagnosis present

## 2022-09-10 DIAGNOSIS — M5432 Sciatica, left side: Secondary | ICD-10-CM | POA: Diagnosis not present

## 2022-09-10 DIAGNOSIS — Z7982 Long term (current) use of aspirin: Secondary | ICD-10-CM | POA: Diagnosis not present

## 2022-09-10 LAB — D-DIMER, QUANTITATIVE: D-Dimer, Quant: 0.31 ug/mL-FEU (ref 0.00–0.50)

## 2022-09-10 MED ORDER — OXYCODONE HCL 5 MG PO TABS: 5.0000 mg | ORAL_TABLET | Freq: Four times a day (QID) | ORAL | 0 refills | Status: AC | PRN

## 2022-09-10 NOTE — ED Provider Notes (Signed)
[facility] Provider Note: Gwendolyn Spurling, MD, FACEP  CSN: [id number] MRN: [id number] ARRIVAL: 09/10/22 at [facility]: [place]  Leg Pain   HISTORY OF PRESENT ILLNESS  09/10/22 3:17 AM Gwendolyn Russell is a 56 y.o. female with a history of spinal stenosis and right sciatica as well as a history of DVT.  She did a lot of walking 2 days ago at [place] [facility] she developed pain in her left leg from her hip down to her foot with pain notably worse in the left calf.  She states this feels like the sciatica she had on the right in the past.  She thinks the left calf is more edematous than the right calf.  The left calf is tender to palpation and movement of the left hip is painful.  She has also been having muscle twitching in her lower legs bilaterally.  She is concerned she may have a DVT on the left.  The patient's took a sleeping pill prior to arrival and is somnolent.  She will arouse but much of the history is given by her daughter.   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    perm upper bridge only   Spinal stenosis    SVD (spontaneous vaginal delivery)    x 2   Vaginal delivery 1985, 1988    Past Surgical History:  Procedure Laterality Date   ABDOMINAL HYSTERECTOMY     BACK SURGERY     BUNIONECTOMY Bilateral    CARPAL TUNNEL RELEASE Left    CHOLECYSTECTOMY N/A 02/05/2021   Procedure: LAPAROSCOPIC CHOLECYSTECTOMY WITH INTRAOPERATIVE CHOLANGIOGRAM;  Surgeon: Coralie Keens, MD;  Location: [facility];  Service: General;   Laterality: N/A;   COLPOSCOPY     ERCP N/A 02/08/2021   Procedure: ENDOSCOPIC RETROGRADE CHOLANGIOPANCREATOGRAPHY (ERCP);  Surgeon: Jackquline Denmark, MD;  Location: [facility];  Service: Endoscopy;  Laterality: N/A;   LAPAROSCOPIC GASTRIC SLEEVE RESECTION  10/11/2015   LASER ABLATION CONDYLOMA CERVICAL / VULVAR     LEEP     MENISCUS REPAIR Right    REMOVAL OF STONES  02/08/2021   Procedure: REMOVAL OF STONES;  Surgeon: Jackquline Denmark, MD;  Location: [facility];  Service: Endoscopy;;   SPHINCTEROTOMY  02/08/2021   Procedure: Joan Mayans;  Surgeon: Jackquline Denmark, MD;  Location: [facility];  Service: Endoscopy;;   TONSILLECTOMY     TUBAL LIGATION     VAGINAL HYSTERECTOMY N/A 02/08/2016   Procedure: HYSTERECTOMY VAGINAL;  Surgeon: Woodroe Mode, MD;  Location: [facility];  Service: Gynecology;  Laterality: N/A;    Family History  Problem Relation Age of Onset   Heart disease Maternal Grandmother    Diabetes Father    Heart disease Father    Deep vein thrombosis Father    Early death Mother    Breast cancer Sister    Diabetes Sister     Social History   Tobacco  Use   Smoking status: Never   Smokeless tobacco: Never  Vaping Use   Vaping Use: Never used  Substance Use Topics   Alcohol use: No   Drug use: No    Prior to Admission medications   Medication Sig Start Date End Date Taking? Authorizing Provider  acetaminophen (TYLENOL) 500 MG tablet Take 1,000 mg by mouth every 6 (six) hours as needed for moderate pain.    [provider]  albuterol (VENTOLIN HFA) 108 (90 Base) MCG/ACT inhaler Inhale 1-2 puffs into the lungs every 6 (six) hours as needed for wheezing or shortness of breath. 06/20/20   Wieters, Hallie C, PA-C  ALPRAZolam (XANAX) 1 MG tablet Take 1 mg by mouth 3 (three) times daily as needed.    [provider]  aspirin EC 81 MG tablet Take 81 mg by mouth daily. Swallow whole.    [provider]  betamethasone valerate ointment (VALISONE)  0.1 % Apply 1 application topically 2 (two) times daily. 09/03/20   [provider]  Biotin 10000 MCG TABS Take 10,000 Units by mouth daily.    [provider]  Cyanocobalamin (VITAMIN B 12 PO) Take 1 capsule by mouth daily.    [provider]  diclofenac Sodium (VOLTAREN) 1 % GEL Apply 2 g topically daily as needed (pain).    [provider]  dicyclomine (BENTYL) 20 MG tablet Take 20 mg by mouth 3 (three) times daily before meals.    [provider]  famotidine (PEPCID) 40 MG tablet Take 40 mg by mouth at bedtime.    [provider]  fluocinonide ointment (LIDEX) 0.27 % Apply 1 application topically daily as needed. 10/05/20   [provider]  gabapentin (NEURONTIN) 300 MG capsule Take 600 mg by mouth 3 (three) times daily.    [provider]  hydrOXYzine (VISTARIL) 25 MG capsule Take 25 mg by mouth at bedtime.    [provider]  Lactobacillus (PROBIOTIC ACIDOPHILUS PO) Take 1 tablet by mouth daily.    [provider]  levocetirizine (XYZAL) 5 MG tablet Take 5 mg by mouth daily as needed for allergies.    [provider]  linaclotide (LINZESS) 290 MCG CAPS capsule Take 290 mcg by mouth daily before breakfast.    [provider]  magnesium gluconate (MAGONATE) 500 MG tablet Take 500 mg by mouth daily.    [provider]  nortriptyline (PAMELOR) 50 MG capsule Take 100 mg by mouth at bedtime.    [provider]  ondansetron (ZOFRAN ODT) 8 MG disintegrating tablet Take 1 tablet (8 mg total) by mouth every 8 (eight) hours as needed for nausea or vomiting. 02/02/21   Dorie Rank, MD  Oxcarbazepine (TRILEPTAL) 300 MG tablet Take 150-300 mg by mouth See admin instructions. Takes 1/2 tablet in the morning and 1 tablet at night    [provider]  oxyCODONE (OXY IR/ROXICODONE) 5 MG immediate release tablet Take 1 tablet (5 mg total) by mouth every 6 (six) hours as needed for  severe pain. 09/10/22   Sherice Ijames, MD  pantoprazole (PROTONIX) 40 MG tablet Take 40 mg by mouth 2 (two) times daily.    [provider]  prazosin (MINIPRESS) 5 MG capsule Take 5 mg by mouth at bedtime.    [provider]  Prenatal Vit-Fe Fumarate-FA (PRENATAL MULTIVITAMIN) TABS tablet Take 1 tablet by mouth daily at 12 noon.    [provider]  Probiotic Product (PROBIOTIC DAILY PO) Take 1 tablet  by mouth daily.    [provider]  sucralfate (CARAFATE) 1 g tablet Take 1 g by mouth daily as needed (stomach pain).    [provider]  norelgestromin-ethinyl estradiol [name]) 150-35 MCG/24HR transdermal patch APPLY ONE PATCH TOPICALLY TO SKIN ONCE A WEEK. REMOVE OLD PATCH. 06/19/19 06/01/20  Woodroe Mode, MD  topiramate (TOPAMAX) 50 MG tablet Take 50 mg by mouth 3 (three) times daily.   06/01/20  [provider]    Allergies Nsaids, Tolmetin, Zonisamide, Cephalosporins, Ciprofloxacin, Clindamycin, Tdap [tetanus-diphth-acell pertussis], Latex, Sulfa antibiotics, and Tape   REVIEW OF SYSTEMS  Negative except as noted here or in the History of Present Illness.   PHYSICAL EXAMINATION  Initial Vital Signs Blood pressure (!) 126/112, pulse 96, height '5\' 6"'$  (1.676 m), weight 108 kg, last menstrual period 08/25/2015, SpO2 94 %.  Examination General: Well-developed, well-nourished female in no acute distress; appearance consistent with age of record HENT: normocephalic; atraumatic Eyes: Normal appearance Neck: supple Heart: regular rate and rhythm Lungs: clear to auscultation bilaterally Abdomen: soft; nondistended; nontender; bowel sounds present Back: Positive straight leg raise on the left Extremities: No deformity; tenderness of left calf without appreciable edema, no erythema or warmth Neurologic: Somnolent but arousable; motor function intact in all extremities and symmetric; no facial droop Skin: Warm and dry Psychiatric: Normal  mood and affect   RESULTS  Summary of this visit's results, reviewed and interpreted by myself:   EKG Interpretation  Date/Time:    Ventricular Rate:    PR Interval:    QRS Duration:   QT Interval:    QTC Calculation:   R Axis:     Text Interpretation:         Laboratory Studies: Results for orders placed or performed during the hospital encounter of 09/10/22 (from the past 24 hour(s))  D-dimer, quantitative     Status: None   Collection Time: 09/10/22  3:30 AM  Result Value Ref Range   D-Dimer, Quant 0.31 0.00 - 0.50 ug/mL-FEU   Imaging Studies: No results found.  ED COURSE and MDM  Nursing notes, initial and subsequent vitals signs, including pulse oximetry, reviewed and interpreted by myself.  Vitals:   09/10/22 0254 09/10/22 0300  BP:  (!) 126/112  Pulse:  96  Resp:  18  Temp:  98 F (36.7 C)  TempSrc:  Oral  SpO2:  94%  Weight: 108 kg   Height: '5\' 6"'$  (1.676 m)    Medications - No data to display  Patient's D-dimer is within normal limits.  Her calf is not edematous and I suspect her symptoms are more likely due to sciatica.  She has a physician with whom she can follow-up regarding her sciatica.  PROCEDURES  Procedures   ED DIAGNOSES     ICD-10-CM   1. Sciatica of left side  M54.32          Clanton Emanuelson, MD 09/10/22 [phone]

## 2022-09-10 NOTE — ED Triage Notes (Signed)
Reports BIL leg pain L>R that started Friday morning. Reports a lot of walking Friday and Saturday. Pt states she cannot bear weight on L leg due to pain from hip down to foot. Reports decreased sensation to L thigh, sensitivity to touch from knee down to foot. Hx of sciatica on R side. Pt reports edema from knee down to foot on LLE.  Describes pain as "muscle spasms" in addition to constant pain.

## 2024-04-12 ENCOUNTER — Ambulatory Visit (INDEPENDENT_AMBULATORY_CARE_PROVIDER_SITE_OTHER)

## 2024-04-12 ENCOUNTER — Other Ambulatory Visit: Payer: Self-pay

## 2024-04-12 ENCOUNTER — Encounter: Payer: Self-pay | Admitting: Emergency Medicine

## 2024-04-12 ENCOUNTER — Ambulatory Visit
Admission: EM | Admit: 2024-04-12 | Discharge: 2024-04-12 | Disposition: A | Discharge status: Home / Self Care | Attending: Family Medicine | Admitting: Family Medicine

## 2024-04-12 DIAGNOSIS — J069 Acute upper respiratory infection, unspecified: Secondary | ICD-10-CM

## 2024-04-12 DIAGNOSIS — R059 Cough, unspecified: Secondary | ICD-10-CM | POA: Diagnosis not present

## 2024-04-12 DIAGNOSIS — J309 Allergic rhinitis, unspecified: Secondary | ICD-10-CM | POA: Diagnosis not present

## 2024-04-12 MED ORDER — PREDNISONE 20 MG PO TABS: ORAL_TABLET | ORAL | 0 refills | Status: AC

## 2024-04-12 MED ORDER — AMOXICILLIN-POT CLAVULANATE 875-125 MG PO TABS
1.0000 | ORAL_TABLET | Freq: Two times a day (BID) | ORAL | 0 refills | Status: AC
Start: 2024-04-12 — End: ?

## 2024-04-12 MED ORDER — FEXOFENADINE HCL 180 MG PO TABS: 180.0000 mg | ORAL_TABLET | Freq: Every day | ORAL | 0 refills | Status: AC

## 2024-04-12 MED ORDER — HYDROCODONE BIT-HOMATROP MBR 5-1.5 MG/5ML PO SOLN: 5.0000 mL | Freq: Four times a day (QID) | ORAL | 0 refills | Status: AC | PRN

## 2024-04-12 NOTE — ED Provider Notes (Signed)
 [facility]    CSN: [id number] Arrival date & time: 04/12/24  1008      History   Chief Complaint Chief Complaint  Patient presents with   Cough    HPI Gwendolyn Russell is a 58 y.o. female.   HPI Pleasant 58 year old female presents with cough for 2 weeks.  PMH significant for obesity, DVT of left leg, and chronic back pain.  Patient reports that she was treated earlier in the week at [facility] that gave her cough syrup and symptoms are worsening.  Patient reports cough has interrupted her sleep for the past 3-4 nights.  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    perm upper bridge only   Spinal stenosis    SVD (spontaneous vaginal delivery)    x 2   Vaginal delivery 1985, 1988    Patient Active Problem List   Diagnosis Date Noted   Acute cholecystitis 02/04/2021   AKI (acute kidney injury) (HCC) 02/04/2021   Migraine 07/30/2019   Migraine without aura and without status migrainosus, not intractable 01/12/2017   Chronic GERD 12/29/2015   Essential hypertension 12/29/2015   Low back pain with sciatica 07/15/2015   Anxiety 05/13/2015   Obesity 11/15/2012    Past Surgical History:  Procedure Laterality Date   ABDOMINAL HYSTERECTOMY     BACK SURGERY     BUNIONECTOMY Bilateral    CARPAL TUNNEL RELEASE Left    CHOLECYSTECTOMY N/A 02/05/2021   Procedure: LAPAROSCOPIC CHOLECYSTECTOMY WITH INTRAOPERATIVE CHOLANGIOGRAM;  Surgeon: Oza Blumenthal, MD;  Location: [facility];  Service: General;  Laterality: N/A;   COLPOSCOPY     ERCP N/A 02/08/2021    Procedure: ENDOSCOPIC RETROGRADE CHOLANGIOPANCREATOGRAPHY (ERCP);  Surgeon: Lajuan Pila, MD;  Location: [facility];  Service: Endoscopy;  Laterality: N/A;   LAPAROSCOPIC GASTRIC SLEEVE RESECTION  10/11/2015   LASER ABLATION CONDYLOMA CERVICAL / VULVAR     LEEP     MENISCUS REPAIR Right    REMOVAL OF STONES  02/08/2021   Procedure: REMOVAL OF STONES;  Surgeon: Lajuan Pila, MD;  Location: [facility];  Service: Endoscopy;;   SPHINCTEROTOMY  02/08/2021   Procedure: [address];  Surgeon: Lajuan Pila, MD;  Location: [facility];  Service: Endoscopy;;   TONSILLECTOMY     TUBAL LIGATION     VAGINAL HYSTERECTOMY N/A 02/08/2016   Procedure: HYSTERECTOMY VAGINAL;  Surgeon: Tresia Fruit, MD;  Location: [facility];  Service: Gynecology;  Laterality: N/A;    OB History     Gravida  2   Para  2   Term  2   Preterm      AB      Living  2      SAB      IAB      Ectopic      Multiple      Live Births  Home Medications    Prior to Admission medications   Medication Sig Start Date End Date Taking? Authorizing Provider  amoxicillin-clavulanate (AUGMENTIN) 875-125 MG tablet Take 1 tablet by mouth every 12 (twelve) hours. 04/12/24  Yes Leonides Ramp, FNP  fexofenadine [facility] ALLERGY) 180 MG tablet Take 1 tablet (180 mg total) by mouth daily for 15 days. 04/12/24 04/27/24 Yes Leonides Ramp, FNP  HYDROcodone  bit-homatropine (HYCODAN) 5-1.5 MG/5ML syrup Take 5 mLs by mouth every 6 (six) hours as needed for cough. 04/12/24  Yes Leonides Ramp, FNP  predniSONE  (DELTASONE ) 20 MG tablet Take 3 tabs PO daily x 5 days. 04/12/24  Yes Leonides Ramp, FNP  acetaminophen  (TYLENOL ) 500 MG tablet Take 1,000 mg by mouth every 6 (six) hours as needed for moderate pain.    [provider]  albuterol  (VENTOLIN  HFA) 108 (90 Base) MCG/ACT inhaler Inhale 1-2 puffs into the lungs every 6 (six) hours as needed for wheezing or shortness of breath. 06/20/20   Wieters, Hallie C,  PA-C  ALPRAZolam  (XANAX ) 1 MG tablet Take 1 mg by mouth 3 (three) times daily as needed.    [provider]  aspirin  EC 81 MG tablet Take 81 mg by mouth daily. Swallow whole.    [provider]  betamethasone valerate ointment (VALISONE) 0.1 % Apply 1 application topically 2 (two) times daily. 09/03/20   [provider]  Biotin 16109 MCG TABS Take 10,000 Units by mouth daily.    [provider]  Cyanocobalamin (VITAMIN B 12 PO) Take 1 capsule by mouth daily.    [provider]  diclofenac Sodium (VOLTAREN) 1 % GEL Apply 2 g topically daily as needed (pain).    [provider]  dicyclomine (BENTYL) 20 MG tablet Take 20 mg by mouth 3 (three) times daily before meals.    [provider]  famotidine  (PEPCID ) 40 MG tablet Take 40 mg by mouth at bedtime.    [provider]  fluocinonide ointment (LIDEX) 0.05 % Apply 1 application topically daily as needed. 10/05/20   [provider]  gabapentin  (NEURONTIN ) 300 MG capsule Take 600 mg by mouth 3 (three) times daily.    [provider]  hydrOXYzine  (VISTARIL ) 25 MG capsule Take 25 mg by mouth at bedtime.    [provider]  Lactobacillus (PROBIOTIC ACIDOPHILUS PO) Take 1 tablet by mouth daily.    [provider]  levocetirizine (XYZAL) 5 MG tablet Take 5 mg by mouth daily as needed for allergies.    [provider]  linaclotide (LINZESS) 290 MCG CAPS capsule Take 290 mcg by mouth daily before breakfast.    [provider]  magnesium gluconate (MAGONATE) 500 MG tablet Take 500 mg by mouth daily.    [provider]  nortriptyline  (PAMELOR ) 50 MG capsule Take 100 mg by mouth at bedtime.    [provider]  ondansetron  (ZOFRAN  ODT) 8 MG disintegrating tablet Take 1 tablet (8 mg total) by mouth every 8 (eight) hours as needed for nausea or vomiting. 02/02/21   Trish Furl, MD  Oxcarbazepine  (TRILEPTAL ) 300 MG tablet Take  150-300 mg by mouth See admin instructions. Takes 1/2 tablet in the morning and 1 tablet at night    [provider]  oxyCODONE  (OXY IR/ROXICODONE ) 5 MG immediate release tablet Take 1 tablet (5 mg total) by mouth every 6 (six) hours as needed for severe pain. 09/10/22   Molpus, John, MD  pantoprazole  (PROTONIX ) 40 MG tablet Take 40 mg by mouth 2 (  two) times daily.    [provider]  prazosin  (MINIPRESS ) 5 MG capsule Take 5 mg by mouth at bedtime.    [provider]  Prenatal Vit-Fe Fumarate-FA (PRENATAL MULTIVITAMIN) TABS tablet Take 1 tablet by mouth daily at 12 noon.    [provider]  Probiotic Product (PROBIOTIC DAILY PO) Take 1 tablet by mouth daily.    [provider]  sucralfate (CARAFATE) 1 g tablet Take 1 g by mouth daily as needed (stomach pain).    [provider]  norelgestromin -ethinyl estradiol  (XULANE ) 150-35 MCG/24HR transdermal patch APPLY ONE PATCH TOPICALLY TO SKIN ONCE A WEEK. REMOVE OLD PATCH. 06/19/19 06/01/20  Tresia Fruit, MD  topiramate  (TOPAMAX ) 50 MG tablet Take 50 mg by mouth 3 (three) times daily.   06/01/20  [provider]    Family History Family History  Problem Relation Age of Onset   Heart disease Maternal Grandmother    Diabetes Father    Heart disease Father    Deep vein thrombosis Father    Early death Mother    Breast cancer Sister    Diabetes Sister     Social History Social History   Tobacco Use   Smoking status: Never   Smokeless tobacco: Never  Vaping Use   Vaping status: Never Used  Substance Use Topics   Alcohol use: No   Drug use: No     Allergies   Nsaids, Tolmetin, Zonisamide, Cephalosporins, Ciprofloxacin , Tdap [tetanus-diphth-acell pertussis], Latex, Sulfa antibiotics, and Tape   Review of Systems Review of Systems  HENT:  Positive for congestion.   Respiratory:  Positive for cough.   Musculoskeletal:  Positive for arthralgias and myalgias.  All other  systems reviewed and are negative.    Physical Exam Triage Vital Signs ED Triage Vitals  Encounter Vitals Group     BP 04/12/24 1040 135/72     Systolic BP Percentile --      Diastolic BP Percentile --      Pulse Rate 04/12/24 1040 92     Resp --      Temp 04/12/24 1040 97.8 F (36.6 C)     Temp Source 04/12/24 1040 Oral     SpO2 04/12/24 1040 96 %     Weight --      Height --      Head Circumference --      Peak Flow --      Pain Score 04/12/24 1042 0     Pain Loc --      Pain Education --      Exclude from Growth Chart --    No data found.  Updated Vital Signs BP 135/72 (BP Location: Right Arm)   Pulse 92   Temp 97.8 F (36.6 C) (Oral)   LMP 08/25/2015   SpO2 96%    Physical Exam Vitals and nursing note reviewed.  Constitutional:      Appearance: Normal appearance. She is obese. She is ill-appearing.  HENT:     Head: Normocephalic and atraumatic.     Right Ear: Tympanic membrane, ear canal and external ear normal.     Left Ear: Tympanic membrane, ear canal and external ear normal.     Mouth/Throat:     Mouth: Mucous membranes are moist.     Pharynx: Oropharynx is clear.     Comments: Significant amount of clear drainage of posterior oropharynx noted Eyes:     Extraocular Movements: Extraocular movements intact.     Conjunctiva/sclera: Conjunctivae  normal.     Pupils: Pupils are equal, round, and reactive to light.  Cardiovascular:     Rate and Rhythm: Normal rate and regular rhythm.     Pulses: Normal pulses.     Heart sounds: Normal heart sounds.  Pulmonary:     Effort: Pulmonary effort is normal.     Breath sounds: Normal breath sounds. No wheezing, rhonchi or rales.     Comments: Frequent nonproductive cough on exam Musculoskeletal:        General: Normal range of motion.     Cervical back: Normal range of motion and neck supple.  Skin:    General: Skin is warm and dry.  Neurological:     General: No focal deficit present.     Mental Status:  She is alert and oriented to person, place, and time. Mental status is at baseline.  Psychiatric:        Mood and Affect: Mood normal.        Behavior: Behavior normal.      UC Treatments / Results  Labs (all labs ordered are listed, but only abnormal results are displayed) Labs Reviewed - No data to display  EKG   Radiology DG Chest 2 View Result Date: 04/12/2024 CLINICAL DATA:  Cough x 1 week EXAM: CHEST - 2 VIEW COMPARISON:  December 28, 2023 FINDINGS: No focal airspace consolidation, pleural effusion, or pneumothorax. No cardiomegaly. Mild no acute fracture or destructive lesion. Multilevel thoracic osteophytosis. Cholecystectomy clips. IMPRESSION: No acute cardiopulmonary abnormality. Electronically Signed   By: Rance Burrows M.D.   On: 04/12/2024 11:43    Procedures Procedures (including critical care time)  Medications Ordered in UC Medications - No data to display  Initial Impression / Assessment and Plan / UC Course  I have reviewed the triage vital signs and the nursing notes.  Pertinent labs & imaging results that were available during my care of the patient were reviewed by me and considered in my medical decision making (see chart for details).     MDM: 1.  Acute URI-Rx'd Augmentin 875/125 mg tablet: Take 1 tablet twice daily x 7 days patient reports taking this medication numerous times without allergic reaction; 2.  Cough, unspecified type-Rx'd prednisone  20 mg tablet: Take 3 tablets daily x 5 days, Rx'd Hycodan 5-1.5 Mg/5 mL syrup: Take 5 mL every 6 hours, as needed for cough; 3.  Allergic rhinitis, unspecified seasonality, unspecified trigger-Rx'd Allegra 180 mg tablet: Take 1 tablet daily x 5 days, then as needed. Advised patient of chest x-ray results with hardcopy and images provided.  Advised patient to take medication as directed with food to completion.  Advised take prednisone  and Allegra with first dose of Augmentin for the next 5 of 7 days.  Advised may  use Allegra as needed afterwards for concurrent postnasal drainage/drip.  Advised may use Hycodan cough syrup at night prior to sleep for cough due to sedative effects.  Encouraged increase daily water intake to 64 ounces per day while taking these medications.  Advised if symptoms worsen and/or unresolved please follow-up with your PCP or here for further evaluation.  Patient discharged home, hemodynamically stable. Final Clinical Impressions(s) / UC Diagnoses   Final diagnoses:  Cough, unspecified type  Acute URI  Allergic rhinitis, unspecified seasonality, unspecified trigger     Discharge Instructions      Advised patient of chest x-ray results with hardcopy and images provided.  Advised patient to take medication as directed with food to completion.  Advised  take prednisone  and Allegra with first dose of Augmentin for the next 5 of 7 days.  Advised may use Allegra as needed afterwards for concurrent postnasal drainage/drip.  Advised may use Hycodan cough syrup at night prior to sleep for cough due to sedative effects.  Encouraged increase daily water intake to 64 ounces per day while taking these medications.  Advised if symptoms worsen and/or unresolved please follow-up with your PCP or here for further evaluation.   ED Prescriptions     Medication Sig Dispense Auth. Provider   amoxicillin-clavulanate (AUGMENTIN) 875-125 MG tablet Take 1 tablet by mouth every 12 (twelve) hours. 14 tablet Sontee Desena, FNP   predniSONE  (DELTASONE ) 20 MG tablet Take 3 tabs PO daily x 5 days. 15 tablet Tamkia Temples, FNP   fexofenadine (ALLEGRA ALLERGY) 180 MG tablet Take 1 tablet (180 mg total) by mouth daily for 15 days. 15 tablet Shamir Tuzzolino, FNP   HYDROcodone  bit-homatropine (HYCODAN) 5-1.5 MG/5ML syrup Take 5 mLs by mouth every 6 (six) hours as needed for cough. 120 mL Leonides Ramp, FNP      I have reviewed the PDMP during this encounter.   Leonides Ramp, FNP 04/12/24 1212

## 2024-04-12 NOTE — Discharge Instructions (Addendum)
 Advised patient of chest x-ray results with hardcopy and images provided.  Advised patient to take medication as directed with food to completion.  Advised take prednisone  and Allegra with first dose of Augmentin for the next 5 of 7 days.  Advised may use Allegra as needed afterwards for concurrent postnasal drainage/drip.  Advised may use Hycodan cough syrup at night prior to sleep for cough due to sedative effects.  Encouraged increase daily water intake to 64 ounces per day while taking these medications.  Advised if symptoms worsen and/or unresolved please follow-up with your PCP or here for further evaluation.

## 2024-04-12 NOTE — ED Triage Notes (Signed)
 Pt states she started having cough and body aches Tuesday and was [facility] and treated with cough syrup, states symptoms have worsened and now feels like she has worse cough with mucous and sinus pressure/swelling.
